# Patient Record
Sex: Female | Born: 1935 | Race: White | Hispanic: No
Health system: Southern US, Community
[De-identification: ages and names within clinical notes are randomized; demographics above are authoritative.]

## PROBLEM LIST (undated history)

## (undated) DIAGNOSIS — G4730 Sleep apnea, unspecified: Secondary | ICD-10-CM

## (undated) DIAGNOSIS — I509 Heart failure, unspecified: Secondary | ICD-10-CM

## (undated) DIAGNOSIS — J45909 Unspecified asthma, uncomplicated: Secondary | ICD-10-CM

## (undated) DIAGNOSIS — Z9181 History of falling: Secondary | ICD-10-CM

## (undated) DIAGNOSIS — E119 Type 2 diabetes mellitus without complications: Secondary | ICD-10-CM

## (undated) DIAGNOSIS — I10 Essential (primary) hypertension: Secondary | ICD-10-CM

## (undated) DIAGNOSIS — Z9889 Other specified postprocedural states: Secondary | ICD-10-CM

## (undated) DIAGNOSIS — M7500 Adhesive capsulitis of unspecified shoulder: Secondary | ICD-10-CM

## (undated) DIAGNOSIS — M4802 Spinal stenosis, cervical region: Secondary | ICD-10-CM

## (undated) DIAGNOSIS — E11618 Type 2 diabetes mellitus with other diabetic arthropathy: Secondary | ICD-10-CM

## (undated) DIAGNOSIS — G629 Polyneuropathy, unspecified: Secondary | ICD-10-CM

## (undated) DIAGNOSIS — G3184 Mild cognitive impairment, so stated: Principal | ICD-10-CM

## (undated) DIAGNOSIS — I272 Pulmonary hypertension, unspecified: Secondary | ICD-10-CM

## (undated) HISTORY — DX: History of falling: Z91.81

## (undated) HISTORY — DX: Adhesive capsulitis of unspecified shoulder: M75.00

## (undated) HISTORY — DX: Mild cognitive impairment, so stated: G31.84

## (undated) HISTORY — DX: Pulmonary hypertension, unspecified: I27.20

## (undated) HISTORY — DX: Type 2 diabetes mellitus with other diabetic arthropathy: E11.618

## (undated) HISTORY — DX: Other specified postprocedural states: Z98.890

---

## 1997-10-13 ENCOUNTER — Encounter: Admission: RE | Admit: 1997-10-13 | Discharge: 1997-10-13 | Payer: Self-pay | Admitting: Family Medicine

## 1997-10-14 ENCOUNTER — Encounter: Admission: RE | Admit: 1997-10-14 | Discharge: 1997-10-14 | Payer: Self-pay | Admitting: Family Medicine

## 1997-10-25 ENCOUNTER — Emergency Department (HOSPITAL_COMMUNITY): Admission: EM | Admit: 1997-10-25 | Discharge: 1997-10-25 | Payer: Self-pay | Admitting: Emergency Medicine

## 1997-10-27 ENCOUNTER — Encounter: Admission: RE | Admit: 1997-10-27 | Discharge: 1997-10-27 | Payer: Self-pay | Admitting: Family Medicine

## 1997-10-29 ENCOUNTER — Encounter: Admission: RE | Admit: 1997-10-29 | Discharge: 1997-10-29 | Payer: Self-pay | Admitting: Family Medicine

## 1997-11-03 ENCOUNTER — Encounter: Admission: RE | Admit: 1997-11-03 | Discharge: 1997-11-03 | Payer: Self-pay | Admitting: Family Medicine

## 1997-12-15 ENCOUNTER — Encounter: Admission: RE | Admit: 1997-12-15 | Discharge: 1997-12-15 | Payer: Self-pay | Admitting: Family Medicine

## 1998-01-12 ENCOUNTER — Encounter: Admission: RE | Admit: 1998-01-12 | Discharge: 1998-01-12 | Payer: Self-pay | Admitting: Family Medicine

## 1998-01-19 ENCOUNTER — Encounter: Admission: RE | Admit: 1998-01-19 | Discharge: 1998-01-19 | Payer: Self-pay | Admitting: Family Medicine

## 1998-02-09 ENCOUNTER — Encounter: Admission: RE | Admit: 1998-02-09 | Discharge: 1998-02-09 | Payer: Self-pay | Admitting: Sports Medicine

## 1998-02-26 ENCOUNTER — Encounter: Admission: RE | Admit: 1998-02-26 | Discharge: 1998-02-26 | Payer: Self-pay | Admitting: Family Medicine

## 1998-03-10 ENCOUNTER — Encounter: Admission: RE | Admit: 1998-03-10 | Discharge: 1998-03-10 | Payer: Self-pay | Admitting: Family Medicine

## 1998-03-16 ENCOUNTER — Encounter: Admission: RE | Admit: 1998-03-16 | Discharge: 1998-03-16 | Payer: Self-pay | Admitting: Family Medicine

## 1998-04-13 ENCOUNTER — Encounter: Admission: RE | Admit: 1998-04-13 | Discharge: 1998-04-13 | Payer: Self-pay | Admitting: Family Medicine

## 1998-04-21 ENCOUNTER — Encounter: Admission: RE | Admit: 1998-04-21 | Discharge: 1998-04-21 | Payer: Self-pay | Admitting: Family Medicine

## 1998-06-01 ENCOUNTER — Encounter: Admission: RE | Admit: 1998-06-01 | Discharge: 1998-06-01 | Payer: Self-pay | Admitting: Family Medicine

## 1998-06-29 ENCOUNTER — Encounter: Admission: RE | Admit: 1998-06-29 | Discharge: 1998-06-29 | Payer: Self-pay | Admitting: Family Medicine

## 1998-07-09 ENCOUNTER — Encounter: Admission: RE | Admit: 1998-07-09 | Discharge: 1998-07-09 | Payer: Self-pay | Admitting: Family Medicine

## 1998-08-17 ENCOUNTER — Encounter: Admission: RE | Admit: 1998-08-17 | Discharge: 1998-08-17 | Payer: Self-pay | Admitting: Family Medicine

## 1998-08-24 ENCOUNTER — Encounter: Admission: RE | Admit: 1998-08-24 | Discharge: 1998-08-24 | Payer: Self-pay | Admitting: Family Medicine

## 1998-09-21 ENCOUNTER — Encounter: Admission: RE | Admit: 1998-09-21 | Discharge: 1998-09-21 | Payer: Self-pay | Admitting: Family Medicine

## 1998-10-13 ENCOUNTER — Encounter: Admission: RE | Admit: 1998-10-13 | Discharge: 1998-10-13 | Payer: Self-pay | Admitting: Family Medicine

## 1998-11-02 ENCOUNTER — Encounter: Admission: RE | Admit: 1998-11-02 | Discharge: 1998-11-02 | Payer: Self-pay | Admitting: Family Medicine

## 1998-12-14 ENCOUNTER — Encounter: Admission: RE | Admit: 1998-12-14 | Discharge: 1998-12-14 | Payer: Self-pay | Admitting: Family Medicine

## 1999-01-18 ENCOUNTER — Encounter: Admission: RE | Admit: 1999-01-18 | Discharge: 1999-01-18 | Payer: Self-pay | Admitting: Family Medicine

## 1999-02-15 ENCOUNTER — Encounter: Admission: RE | Admit: 1999-02-15 | Discharge: 1999-02-15 | Payer: Self-pay | Admitting: Family Medicine

## 1999-02-23 ENCOUNTER — Encounter: Admission: RE | Admit: 1999-02-23 | Discharge: 1999-05-24 | Payer: Self-pay | Admitting: Family Medicine

## 1999-03-08 ENCOUNTER — Encounter: Admission: RE | Admit: 1999-03-08 | Discharge: 1999-03-08 | Payer: Self-pay | Admitting: Family Medicine

## 1999-04-05 ENCOUNTER — Encounter: Admission: RE | Admit: 1999-04-05 | Discharge: 1999-04-05 | Payer: Self-pay | Admitting: Family Medicine

## 1999-05-03 ENCOUNTER — Encounter: Admission: RE | Admit: 1999-05-03 | Discharge: 1999-05-03 | Payer: Self-pay | Admitting: Sports Medicine

## 1999-05-31 ENCOUNTER — Encounter: Admission: RE | Admit: 1999-05-31 | Discharge: 1999-05-31 | Payer: Self-pay | Admitting: Family Medicine

## 1999-06-27 ENCOUNTER — Encounter (INDEPENDENT_AMBULATORY_CARE_PROVIDER_SITE_OTHER): Payer: Self-pay | Admitting: *Deleted

## 1999-06-27 LAB — CONVERTED CEMR LAB

## 1999-07-26 ENCOUNTER — Encounter: Admission: RE | Admit: 1999-07-26 | Discharge: 1999-07-26 | Payer: Self-pay | Admitting: Family Medicine

## 1999-07-26 ENCOUNTER — Other Ambulatory Visit: Admission: RE | Admit: 1999-07-26 | Discharge: 1999-07-26 | Payer: Self-pay | Admitting: Family Medicine

## 1999-10-04 ENCOUNTER — Encounter: Admission: RE | Admit: 1999-10-04 | Discharge: 1999-10-04 | Payer: Self-pay | Admitting: Family Medicine

## 1999-10-04 ENCOUNTER — Ambulatory Visit (HOSPITAL_COMMUNITY): Admission: RE | Admit: 1999-10-04 | Discharge: 1999-10-04 | Payer: Self-pay | Admitting: Family Medicine

## 1999-10-27 ENCOUNTER — Encounter: Admission: RE | Admit: 1999-10-27 | Discharge: 1999-10-27 | Payer: Self-pay | Admitting: Sports Medicine

## 1999-10-27 ENCOUNTER — Encounter: Payer: Self-pay | Admitting: Sports Medicine

## 1999-11-08 ENCOUNTER — Encounter: Admission: RE | Admit: 1999-11-08 | Discharge: 1999-11-08 | Payer: Self-pay | Admitting: Family Medicine

## 2000-03-27 ENCOUNTER — Encounter: Admission: RE | Admit: 2000-03-27 | Discharge: 2000-03-27 | Payer: Self-pay | Admitting: Family Medicine

## 2000-05-02 ENCOUNTER — Encounter: Admission: RE | Admit: 2000-05-02 | Discharge: 2000-05-02 | Payer: Self-pay | Admitting: Family Medicine

## 2000-07-10 ENCOUNTER — Encounter: Admission: RE | Admit: 2000-07-10 | Discharge: 2000-07-10 | Payer: Self-pay | Admitting: Family Medicine

## 2000-10-30 ENCOUNTER — Encounter: Admission: RE | Admit: 2000-10-30 | Discharge: 2000-10-30 | Payer: Self-pay | Admitting: Family Medicine

## 2001-04-23 ENCOUNTER — Encounter: Admission: RE | Admit: 2001-04-23 | Discharge: 2001-04-23 | Payer: Self-pay | Admitting: Family Medicine

## 2001-04-30 ENCOUNTER — Encounter: Admission: RE | Admit: 2001-04-30 | Discharge: 2001-04-30 | Payer: Self-pay | Admitting: Family Medicine

## 2001-07-02 ENCOUNTER — Encounter: Admission: RE | Admit: 2001-07-02 | Discharge: 2001-07-02 | Payer: Self-pay | Admitting: Family Medicine

## 2001-09-10 ENCOUNTER — Encounter: Admission: RE | Admit: 2001-09-10 | Discharge: 2001-09-10 | Payer: Self-pay | Admitting: Family Medicine

## 2001-09-23 ENCOUNTER — Encounter: Admission: RE | Admit: 2001-09-23 | Discharge: 2001-09-23 | Payer: Self-pay | Admitting: Family Medicine

## 2001-10-15 ENCOUNTER — Encounter: Admission: RE | Admit: 2001-10-15 | Discharge: 2001-10-15 | Payer: Self-pay | Admitting: Family Medicine

## 2002-02-11 ENCOUNTER — Encounter: Admission: RE | Admit: 2002-02-11 | Discharge: 2002-02-11 | Payer: Self-pay | Admitting: Family Medicine

## 2002-03-14 ENCOUNTER — Encounter: Admission: RE | Admit: 2002-03-14 | Discharge: 2002-03-14 | Payer: Self-pay | Admitting: Family Medicine

## 2002-03-14 ENCOUNTER — Encounter: Payer: Self-pay | Admitting: Family Medicine

## 2002-05-02 ENCOUNTER — Encounter: Admission: RE | Admit: 2002-05-02 | Discharge: 2002-05-02 | Payer: Self-pay | Admitting: Family Medicine

## 2002-05-27 ENCOUNTER — Encounter: Admission: RE | Admit: 2002-05-27 | Discharge: 2002-05-27 | Payer: Self-pay | Admitting: Family Medicine

## 2002-05-27 ENCOUNTER — Encounter: Payer: Self-pay | Admitting: Family Medicine

## 2002-07-08 ENCOUNTER — Encounter: Admission: RE | Admit: 2002-07-08 | Discharge: 2002-07-08 | Payer: Self-pay | Admitting: Family Medicine

## 2002-11-18 ENCOUNTER — Encounter: Admission: RE | Admit: 2002-11-18 | Discharge: 2002-11-18 | Payer: Self-pay | Admitting: Family Medicine

## 2003-03-17 ENCOUNTER — Encounter: Admission: RE | Admit: 2003-03-17 | Discharge: 2003-03-17 | Payer: Self-pay | Admitting: Family Medicine

## 2003-04-14 ENCOUNTER — Encounter: Admission: RE | Admit: 2003-04-14 | Discharge: 2003-04-14 | Payer: Self-pay | Admitting: Family Medicine

## 2003-06-02 ENCOUNTER — Encounter: Admission: RE | Admit: 2003-06-02 | Discharge: 2003-06-02 | Payer: Self-pay | Admitting: Family Medicine

## 2003-07-22 ENCOUNTER — Encounter: Admission: RE | Admit: 2003-07-22 | Discharge: 2003-07-22 | Payer: Self-pay | Admitting: Family Medicine

## 2003-08-25 ENCOUNTER — Encounter: Admission: RE | Admit: 2003-08-25 | Discharge: 2003-08-25 | Payer: Self-pay | Admitting: Family Medicine

## 2003-12-29 ENCOUNTER — Encounter: Admission: RE | Admit: 2003-12-29 | Discharge: 2003-12-29 | Payer: Self-pay | Admitting: Sports Medicine

## 2004-01-26 ENCOUNTER — Encounter: Admission: RE | Admit: 2004-01-26 | Discharge: 2004-01-26 | Payer: Self-pay | Admitting: Family Medicine

## 2004-04-08 ENCOUNTER — Ambulatory Visit: Payer: Self-pay | Admitting: Family Medicine

## 2004-06-14 ENCOUNTER — Ambulatory Visit: Payer: Self-pay | Admitting: Family Medicine

## 2004-06-16 ENCOUNTER — Ambulatory Visit: Payer: Self-pay | Admitting: Sports Medicine

## 2004-06-30 ENCOUNTER — Ambulatory Visit (HOSPITAL_COMMUNITY): Admission: RE | Admit: 2004-06-30 | Discharge: 2004-06-30 | Payer: Self-pay | Admitting: Family Medicine

## 2004-08-02 ENCOUNTER — Ambulatory Visit: Payer: Self-pay | Admitting: Family Medicine

## 2004-12-20 ENCOUNTER — Ambulatory Visit: Payer: Self-pay | Admitting: Family Medicine

## 2005-03-21 ENCOUNTER — Encounter: Admission: RE | Admit: 2005-03-21 | Discharge: 2005-03-21 | Payer: Self-pay | Admitting: Family Medicine

## 2005-03-23 ENCOUNTER — Ambulatory Visit: Payer: Self-pay | Admitting: Family Medicine

## 2005-04-24 ENCOUNTER — Ambulatory Visit: Payer: Self-pay | Admitting: Sports Medicine

## 2005-07-03 ENCOUNTER — Emergency Department (HOSPITAL_COMMUNITY): Admission: EM | Admit: 2005-07-03 | Discharge: 2005-07-03 | Payer: Self-pay | Admitting: Family Medicine

## 2005-08-14 ENCOUNTER — Ambulatory Visit: Payer: Self-pay | Admitting: Family Medicine

## 2005-08-21 ENCOUNTER — Ambulatory Visit: Payer: Self-pay | Admitting: Family Medicine

## 2005-08-24 DIAGNOSIS — Z9889 Other specified postprocedural states: Secondary | ICD-10-CM

## 2005-08-24 HISTORY — DX: Other specified postprocedural states: Z98.890

## 2005-11-06 ENCOUNTER — Ambulatory Visit: Payer: Self-pay | Admitting: Family Medicine

## 2006-01-09 ENCOUNTER — Ambulatory Visit: Payer: Self-pay | Admitting: Family Medicine

## 2006-01-09 ENCOUNTER — Encounter: Admission: RE | Admit: 2006-01-09 | Discharge: 2006-01-09 | Payer: Self-pay | Admitting: Family Medicine

## 2006-01-30 ENCOUNTER — Ambulatory Visit: Payer: Self-pay | Admitting: Family Medicine

## 2006-03-19 ENCOUNTER — Ambulatory Visit: Payer: Self-pay | Admitting: Family Medicine

## 2006-07-09 ENCOUNTER — Ambulatory Visit: Payer: Self-pay | Admitting: Sports Medicine

## 2006-08-23 DIAGNOSIS — F329 Major depressive disorder, single episode, unspecified: Secondary | ICD-10-CM

## 2006-08-23 DIAGNOSIS — E785 Hyperlipidemia, unspecified: Secondary | ICD-10-CM

## 2006-08-23 DIAGNOSIS — K219 Gastro-esophageal reflux disease without esophagitis: Secondary | ICD-10-CM | POA: Insufficient documentation

## 2006-08-23 DIAGNOSIS — G4730 Sleep apnea, unspecified: Secondary | ICD-10-CM | POA: Insufficient documentation

## 2006-08-23 DIAGNOSIS — I10 Essential (primary) hypertension: Secondary | ICD-10-CM

## 2006-08-23 DIAGNOSIS — G4700 Insomnia, unspecified: Secondary | ICD-10-CM

## 2006-08-23 DIAGNOSIS — I872 Venous insufficiency (chronic) (peripheral): Secondary | ICD-10-CM | POA: Insufficient documentation

## 2006-08-23 DIAGNOSIS — L719 Rosacea, unspecified: Secondary | ICD-10-CM | POA: Insufficient documentation

## 2006-08-23 DIAGNOSIS — M479 Spondylosis, unspecified: Secondary | ICD-10-CM | POA: Insufficient documentation

## 2006-08-23 DIAGNOSIS — E669 Obesity, unspecified: Secondary | ICD-10-CM

## 2006-08-23 DIAGNOSIS — M1710 Unilateral primary osteoarthritis, unspecified knee: Secondary | ICD-10-CM

## 2006-08-24 ENCOUNTER — Encounter (INDEPENDENT_AMBULATORY_CARE_PROVIDER_SITE_OTHER): Payer: Self-pay | Admitting: *Deleted

## 2006-08-30 ENCOUNTER — Telehealth (INDEPENDENT_AMBULATORY_CARE_PROVIDER_SITE_OTHER): Payer: Self-pay | Admitting: *Deleted

## 2006-09-06 ENCOUNTER — Encounter: Payer: Self-pay | Admitting: Family Medicine

## 2006-09-14 ENCOUNTER — Telehealth: Payer: Self-pay | Admitting: Family Medicine

## 2006-10-11 ENCOUNTER — Encounter: Payer: Self-pay | Admitting: Family Medicine

## 2006-10-11 ENCOUNTER — Ambulatory Visit: Payer: Self-pay | Admitting: Family Medicine

## 2006-10-11 LAB — CONVERTED CEMR LAB: Hgb A1c MFr Bld: 6.2 %

## 2006-10-15 ENCOUNTER — Encounter: Payer: Self-pay | Admitting: Family Medicine

## 2006-10-15 LAB — CONVERTED CEMR LAB
BUN: 25 mg/dL — ABNORMAL HIGH (ref 6–23)
CO2: 25 meq/L (ref 19–32)
Calcium: 9 mg/dL (ref 8.4–10.5)
Chloride: 102 meq/L (ref 96–112)
Creatinine, Ser: 1.17 mg/dL (ref 0.40–1.20)
Glucose, Bld: 97 mg/dL (ref 70–99)
Potassium: 5.6 meq/L — ABNORMAL HIGH (ref 3.5–5.3)
Sodium: 142 meq/L (ref 135–145)

## 2006-10-19 ENCOUNTER — Ambulatory Visit: Payer: Self-pay | Admitting: Family Medicine

## 2006-10-19 ENCOUNTER — Encounter: Payer: Self-pay | Admitting: Family Medicine

## 2006-10-26 LAB — CONVERTED CEMR LAB
BUN: 31 mg/dL — ABNORMAL HIGH (ref 6–23)
Chloride: 102 meq/L (ref 96–112)
Creatinine, Ser: 1.42 mg/dL — ABNORMAL HIGH (ref 0.40–1.20)
Glucose, Bld: 83 mg/dL (ref 70–99)
Potassium: 5.6 meq/L — ABNORMAL HIGH (ref 3.5–5.3)

## 2006-11-01 ENCOUNTER — Telehealth (INDEPENDENT_AMBULATORY_CARE_PROVIDER_SITE_OTHER): Payer: Self-pay | Admitting: Family Medicine

## 2006-11-13 ENCOUNTER — Telehealth: Payer: Self-pay | Admitting: *Deleted

## 2006-12-18 ENCOUNTER — Telehealth: Payer: Self-pay | Admitting: *Deleted

## 2006-12-29 ENCOUNTER — Emergency Department (HOSPITAL_COMMUNITY): Admission: EM | Admit: 2006-12-29 | Discharge: 2006-12-29 | Payer: Self-pay | Admitting: Family Medicine

## 2006-12-31 ENCOUNTER — Ambulatory Visit: Payer: Self-pay | Admitting: Sports Medicine

## 2006-12-31 ENCOUNTER — Telehealth: Payer: Self-pay | Admitting: *Deleted

## 2006-12-31 LAB — CONVERTED CEMR LAB
Nitrite: NEGATIVE
Protein, U semiquant: 30
Urobilinogen, UA: 0.2

## 2007-01-01 ENCOUNTER — Ambulatory Visit: Payer: Self-pay | Admitting: Vascular Surgery

## 2007-01-01 ENCOUNTER — Encounter (INDEPENDENT_AMBULATORY_CARE_PROVIDER_SITE_OTHER): Payer: Self-pay | Admitting: Internal Medicine

## 2007-01-01 ENCOUNTER — Encounter: Payer: Self-pay | Admitting: Family Medicine

## 2007-01-01 ENCOUNTER — Ambulatory Visit: Payer: Self-pay | Admitting: Family Medicine

## 2007-01-01 ENCOUNTER — Inpatient Hospital Stay (HOSPITAL_COMMUNITY): Admission: EM | Admit: 2007-01-01 | Discharge: 2007-01-22 | Payer: Self-pay | Admitting: Emergency Medicine

## 2007-01-02 ENCOUNTER — Encounter: Payer: Self-pay | Admitting: Family Medicine

## 2007-01-11 ENCOUNTER — Encounter (INDEPENDENT_AMBULATORY_CARE_PROVIDER_SITE_OTHER): Payer: Self-pay | Admitting: Gastroenterology

## 2007-01-17 ENCOUNTER — Encounter: Payer: Self-pay | Admitting: Family Medicine

## 2007-01-21 ENCOUNTER — Encounter: Payer: Self-pay | Admitting: Family Medicine

## 2007-01-23 ENCOUNTER — Encounter: Admission: RE | Admit: 2007-01-23 | Discharge: 2007-01-23 | Payer: Self-pay | Admitting: Family Medicine

## 2007-01-27 ENCOUNTER — Telehealth (INDEPENDENT_AMBULATORY_CARE_PROVIDER_SITE_OTHER): Payer: Self-pay | Admitting: Family Medicine

## 2007-01-28 ENCOUNTER — Telehealth: Payer: Self-pay | Admitting: Family Medicine

## 2007-02-19 ENCOUNTER — Encounter: Payer: Self-pay | Admitting: Family Medicine

## 2007-03-11 ENCOUNTER — Ambulatory Visit: Payer: Self-pay | Admitting: Family Medicine

## 2007-03-11 ENCOUNTER — Encounter: Payer: Self-pay | Admitting: Family Medicine

## 2007-03-11 DIAGNOSIS — D510 Vitamin B12 deficiency anemia due to intrinsic factor deficiency: Secondary | ICD-10-CM | POA: Insufficient documentation

## 2007-03-11 LAB — CONVERTED CEMR LAB
BUN: 20 mg/dL (ref 6–23)
CO2: 25 meq/L (ref 19–32)
Chloride: 99 meq/L (ref 96–112)
Glucose, Bld: 113 mg/dL — ABNORMAL HIGH (ref 70–99)
Hemoglobin: 12.3 g/dL (ref 12.0–15.0)
Hgb A1c MFr Bld: 6.5 %
Potassium: 4.9 meq/L (ref 3.5–5.3)
RBC: 4.4 M/uL (ref 3.87–5.11)
RDW: 15.6 % — ABNORMAL HIGH (ref 11.5–14.0)
WBC: 10 10*3/uL (ref 4.0–10.5)

## 2007-03-13 ENCOUNTER — Telehealth: Payer: Self-pay | Admitting: Family Medicine

## 2007-03-18 ENCOUNTER — Telehealth: Payer: Self-pay | Admitting: Family Medicine

## 2007-03-22 ENCOUNTER — Telehealth: Payer: Self-pay | Admitting: *Deleted

## 2007-03-25 ENCOUNTER — Emergency Department (HOSPITAL_COMMUNITY): Admission: EM | Admit: 2007-03-25 | Discharge: 2007-03-26 | Payer: Self-pay | Admitting: Emergency Medicine

## 2007-03-25 ENCOUNTER — Ambulatory Visit: Payer: Self-pay | Admitting: Sports Medicine

## 2007-03-25 ENCOUNTER — Telehealth: Payer: Self-pay | Admitting: *Deleted

## 2007-03-26 ENCOUNTER — Telehealth: Payer: Self-pay | Admitting: *Deleted

## 2007-03-29 ENCOUNTER — Telehealth: Payer: Self-pay | Admitting: *Deleted

## 2007-03-29 ENCOUNTER — Ambulatory Visit: Payer: Self-pay | Admitting: Family Medicine

## 2007-04-01 ENCOUNTER — Encounter: Payer: Self-pay | Admitting: Family Medicine

## 2007-04-10 ENCOUNTER — Telehealth: Payer: Self-pay | Admitting: Family Medicine

## 2007-04-11 ENCOUNTER — Ambulatory Visit: Payer: Self-pay | Admitting: Family Medicine

## 2007-04-12 ENCOUNTER — Encounter: Payer: Self-pay | Admitting: Family Medicine

## 2007-04-12 LAB — CONVERTED CEMR LAB
ALT: 39 units/L — ABNORMAL HIGH (ref 0–35)
CO2: 24 meq/L (ref 19–32)
Creatinine, Ser: 1.46 mg/dL — ABNORMAL HIGH (ref 0.40–1.20)
HCT: 41.9 % (ref 36.0–46.0)
MCHC: 30.8 g/dL (ref 30.0–36.0)
MCV: 92.3 fL (ref 78.0–100.0)
Platelets: 265 10*3/uL (ref 150–400)
Total Bilirubin: 0.6 mg/dL (ref 0.3–1.2)
Uric Acid, Serum: 6.6 mg/dL (ref 2.4–7.0)
WBC: 7.3 10*3/uL (ref 4.0–10.5)

## 2007-05-01 ENCOUNTER — Telehealth: Payer: Self-pay | Admitting: *Deleted

## 2007-05-02 ENCOUNTER — Ambulatory Visit: Payer: Self-pay | Admitting: Family Medicine

## 2007-05-30 ENCOUNTER — Ambulatory Visit: Payer: Self-pay | Admitting: Family Medicine

## 2007-05-31 ENCOUNTER — Encounter: Payer: Self-pay | Admitting: Family Medicine

## 2007-05-31 ENCOUNTER — Telehealth: Payer: Self-pay | Admitting: Family Medicine

## 2007-06-06 ENCOUNTER — Telehealth: Payer: Self-pay | Admitting: *Deleted

## 2007-06-06 ENCOUNTER — Telehealth (INDEPENDENT_AMBULATORY_CARE_PROVIDER_SITE_OTHER): Payer: Self-pay | Admitting: Family Medicine

## 2007-06-06 ENCOUNTER — Ambulatory Visit: Payer: Self-pay | Admitting: Family Medicine

## 2007-06-18 ENCOUNTER — Telehealth: Payer: Self-pay | Admitting: Family Medicine

## 2007-07-11 ENCOUNTER — Ambulatory Visit: Payer: Self-pay | Admitting: Family Medicine

## 2007-07-11 DIAGNOSIS — N183 Chronic kidney disease, stage 3 unspecified: Secondary | ICD-10-CM | POA: Insufficient documentation

## 2007-07-11 LAB — CONVERTED CEMR LAB: Hgb A1c MFr Bld: 6 %

## 2007-07-12 LAB — CONVERTED CEMR LAB
Albumin: 4.5 g/dL (ref 3.5–5.2)
BUN: 33 mg/dL — ABNORMAL HIGH (ref 6–23)
Calcium: 9.7 mg/dL (ref 8.4–10.5)
Chloride: 99 meq/L (ref 96–112)
Glucose, Bld: 95 mg/dL (ref 70–99)
Hemoglobin: 13.7 g/dL (ref 12.0–15.0)
MCHC: 31.9 g/dL (ref 30.0–36.0)
Potassium: 4.3 meq/L (ref 3.5–5.3)
RBC: 4.35 M/uL (ref 3.87–5.11)

## 2007-07-17 ENCOUNTER — Encounter: Payer: Self-pay | Admitting: Family Medicine

## 2007-08-22 ENCOUNTER — Ambulatory Visit: Payer: Self-pay | Admitting: Family Medicine

## 2007-08-22 ENCOUNTER — Telehealth: Payer: Self-pay | Admitting: *Deleted

## 2007-08-22 LAB — CONVERTED CEMR LAB
Cholesterol: 133 mg/dL (ref 0–200)
Potassium: 4.2 meq/L (ref 3.5–5.3)
Sodium: 140 meq/L (ref 135–145)
Total CHOL/HDL Ratio: 2.6
Triglycerides: 160 mg/dL — ABNORMAL HIGH (ref <150)
VLDL: 32 mg/dL (ref 0–40)

## 2007-08-26 ENCOUNTER — Encounter: Payer: Self-pay | Admitting: Family Medicine

## 2007-09-05 ENCOUNTER — Telehealth: Payer: Self-pay | Admitting: Family Medicine

## 2007-09-19 ENCOUNTER — Encounter: Payer: Self-pay | Admitting: Family Medicine

## 2007-10-10 ENCOUNTER — Ambulatory Visit: Payer: Self-pay | Admitting: Family Medicine

## 2007-10-24 ENCOUNTER — Encounter: Payer: Self-pay | Admitting: Family Medicine

## 2007-11-05 ENCOUNTER — Encounter: Admission: RE | Admit: 2007-11-05 | Discharge: 2007-11-05 | Payer: Self-pay | Admitting: Family Medicine

## 2007-11-12 ENCOUNTER — Ambulatory Visit: Payer: Self-pay | Admitting: Family Medicine

## 2007-11-14 ENCOUNTER — Ambulatory Visit: Payer: Self-pay | Admitting: Family Medicine

## 2007-11-14 DIAGNOSIS — R32 Unspecified urinary incontinence: Secondary | ICD-10-CM

## 2007-11-14 LAB — CONVERTED CEMR LAB
Bilirubin Urine: NEGATIVE
CO2: 27 meq/L (ref 19–32)
Calcium: 9.7 mg/dL (ref 8.4–10.5)
Glucose, Urine, Semiquant: NEGATIVE
Nitrite: NEGATIVE
Protein, U semiquant: 100
Sodium: 140 meq/L (ref 135–145)
Specific Gravity, Urine: 1.015

## 2007-11-25 ENCOUNTER — Encounter: Payer: Self-pay | Admitting: Family Medicine

## 2007-11-29 ENCOUNTER — Telehealth: Payer: Self-pay | Admitting: Family Medicine

## 2007-12-19 ENCOUNTER — Ambulatory Visit: Payer: Self-pay | Admitting: Family Medicine

## 2007-12-23 ENCOUNTER — Encounter: Payer: Self-pay | Admitting: Family Medicine

## 2007-12-27 ENCOUNTER — Emergency Department (HOSPITAL_COMMUNITY): Admission: EM | Admit: 2007-12-27 | Discharge: 2007-12-27 | Payer: Self-pay | Admitting: Emergency Medicine

## 2008-01-10 ENCOUNTER — Ambulatory Visit: Payer: Self-pay | Admitting: Family Medicine

## 2008-02-05 ENCOUNTER — Encounter: Payer: Self-pay | Admitting: Family Medicine

## 2008-02-05 DIAGNOSIS — E1149 Type 2 diabetes mellitus with other diabetic neurological complication: Secondary | ICD-10-CM | POA: Insufficient documentation

## 2008-02-12 ENCOUNTER — Ambulatory Visit: Payer: Self-pay | Admitting: Family Medicine

## 2008-02-20 ENCOUNTER — Encounter: Payer: Self-pay | Admitting: Family Medicine

## 2008-02-27 ENCOUNTER — Encounter: Payer: Self-pay | Admitting: Family Medicine

## 2008-02-27 ENCOUNTER — Ambulatory Visit: Payer: Self-pay | Admitting: Family Medicine

## 2008-02-27 LAB — CONVERTED CEMR LAB
CO2: 27 meq/L (ref 19–32)
Calcium: 9.3 mg/dL (ref 8.4–10.5)
Chloride: 99 meq/L (ref 96–112)
Glucose, Bld: 90 mg/dL (ref 70–99)
Hgb A1c MFr Bld: 6.4 %
MCV: 95.9 fL (ref 78.0–100.0)
Potassium: 4.9 meq/L (ref 3.5–5.3)
RBC: 3.86 M/uL — ABNORMAL LOW (ref 3.87–5.11)
Sodium: 136 meq/L (ref 135–145)
WBC: 6.1 10*3/uL (ref 4.0–10.5)

## 2008-03-09 ENCOUNTER — Encounter: Admission: RE | Admit: 2008-03-09 | Discharge: 2008-03-09 | Payer: Self-pay | Admitting: Family Medicine

## 2008-03-10 ENCOUNTER — Encounter: Payer: Self-pay | Admitting: Family Medicine

## 2008-03-11 ENCOUNTER — Encounter: Payer: Self-pay | Admitting: Family Medicine

## 2008-03-13 ENCOUNTER — Encounter: Payer: Self-pay | Admitting: Family Medicine

## 2008-03-17 ENCOUNTER — Encounter: Payer: Self-pay | Admitting: Family Medicine

## 2008-03-23 ENCOUNTER — Ambulatory Visit: Payer: Self-pay | Admitting: Family Medicine

## 2008-04-17 ENCOUNTER — Emergency Department (HOSPITAL_COMMUNITY): Admission: EM | Admit: 2008-04-17 | Discharge: 2008-04-17 | Payer: Self-pay | Admitting: Emergency Medicine

## 2008-05-01 ENCOUNTER — Encounter: Payer: Self-pay | Admitting: Family Medicine

## 2008-05-01 ENCOUNTER — Emergency Department (HOSPITAL_COMMUNITY): Admission: EM | Admit: 2008-05-01 | Discharge: 2008-05-01 | Payer: Self-pay | Admitting: Family Medicine

## 2008-07-09 ENCOUNTER — Encounter: Payer: Self-pay | Admitting: Family Medicine

## 2008-07-16 ENCOUNTER — Ambulatory Visit (HOSPITAL_COMMUNITY): Admission: RE | Admit: 2008-07-16 | Discharge: 2008-07-16 | Payer: Self-pay | Admitting: Family Medicine

## 2008-07-16 ENCOUNTER — Ambulatory Visit: Payer: Self-pay | Admitting: Family Medicine

## 2008-07-16 DIAGNOSIS — G2581 Restless legs syndrome: Secondary | ICD-10-CM

## 2008-07-16 LAB — CONVERTED CEMR LAB
BUN: 22 mg/dL
CO2: 25 meq/L
Calcium: 9 mg/dL
Chloride: 98 meq/L
Creatinine, Ser: 1.18 mg/dL
Glucose, Bld: 90 mg/dL
HCT: 36.1 %
Hemoglobin: 11.7 g/dL — ABNORMAL LOW
Hgb A1c MFr Bld: 6.2 %
MCHC: 32.4 g/dL
MCV: 96.8 fL
Platelets: 260 K/uL
Potassium: 4.9 meq/L
RBC: 3.73 M/uL — ABNORMAL LOW
RDW: 16 % — ABNORMAL HIGH
Sodium: 139 meq/L
Uric Acid, Serum: 3 mg/dL
Vit D, 1,25-Dihydroxy: 14 — ABNORMAL LOW
WBC: 6.8 10*3/microliter

## 2008-07-17 ENCOUNTER — Encounter: Payer: Self-pay | Admitting: Family Medicine

## 2008-07-18 ENCOUNTER — Emergency Department (HOSPITAL_COMMUNITY): Admission: EM | Admit: 2008-07-18 | Discharge: 2008-07-18 | Payer: Self-pay | Admitting: Emergency Medicine

## 2008-07-22 ENCOUNTER — Encounter: Payer: Self-pay | Admitting: Family Medicine

## 2008-07-28 ENCOUNTER — Telehealth: Payer: Self-pay | Admitting: Family Medicine

## 2008-08-11 ENCOUNTER — Encounter (INDEPENDENT_AMBULATORY_CARE_PROVIDER_SITE_OTHER): Payer: Self-pay | Admitting: *Deleted

## 2008-08-12 ENCOUNTER — Encounter: Payer: Self-pay | Admitting: Cardiovascular Disease

## 2008-08-12 ENCOUNTER — Ambulatory Visit: Payer: Self-pay | Admitting: Cardiovascular Disease

## 2008-08-13 ENCOUNTER — Encounter: Payer: Self-pay | Admitting: Family Medicine

## 2008-08-14 ENCOUNTER — Telehealth: Payer: Self-pay | Admitting: Family Medicine

## 2008-08-20 ENCOUNTER — Ambulatory Visit: Payer: Self-pay | Admitting: Family Medicine

## 2008-08-21 ENCOUNTER — Encounter: Payer: Self-pay | Admitting: Family Medicine

## 2008-08-21 ENCOUNTER — Ambulatory Visit: Payer: Self-pay

## 2008-08-31 ENCOUNTER — Encounter: Payer: Self-pay | Admitting: Family Medicine

## 2008-09-02 ENCOUNTER — Telehealth: Payer: Self-pay | Admitting: Family Medicine

## 2008-09-08 ENCOUNTER — Encounter: Payer: Self-pay | Admitting: Family Medicine

## 2008-10-27 ENCOUNTER — Telehealth: Payer: Self-pay | Admitting: Family Medicine

## 2008-10-27 ENCOUNTER — Encounter: Payer: Self-pay | Admitting: Family Medicine

## 2008-12-07 ENCOUNTER — Telehealth: Payer: Self-pay | Admitting: Family Medicine

## 2008-12-17 ENCOUNTER — Ambulatory Visit: Payer: Self-pay | Admitting: Family Medicine

## 2008-12-17 LAB — CONVERTED CEMR LAB
ALT: 12 units/L (ref 0–35)
Alkaline Phosphatase: 91 units/L (ref 39–117)
CO2: 25 meq/L (ref 19–32)
Creatinine, Ser: 1.18 mg/dL (ref 0.40–1.20)
Glucose, Bld: 144 mg/dL — ABNORMAL HIGH (ref 70–99)
HCT: 35.3 % — ABNORMAL LOW (ref 36.0–46.0)
MCHC: 32.3 g/dL (ref 30.0–36.0)
MCV: 97.5 fL (ref 78.0–100.0)
Platelets: 263 10*3/uL (ref 150–400)
Sodium: 136 meq/L (ref 135–145)
Total Bilirubin: 0.4 mg/dL (ref 0.3–1.2)
Total Protein: 6.4 g/dL (ref 6.0–8.3)

## 2009-01-12 ENCOUNTER — Encounter: Payer: Self-pay | Admitting: Family Medicine

## 2009-01-13 ENCOUNTER — Ambulatory Visit: Payer: Self-pay | Admitting: Family Medicine

## 2009-02-11 ENCOUNTER — Encounter: Admission: RE | Admit: 2009-02-11 | Discharge: 2009-02-11 | Payer: Self-pay | Admitting: Family Medicine

## 2009-02-11 ENCOUNTER — Encounter: Payer: Self-pay | Admitting: Family Medicine

## 2009-02-18 ENCOUNTER — Ambulatory Visit: Payer: Self-pay | Admitting: Cardiovascular Disease

## 2009-02-23 ENCOUNTER — Ambulatory Visit: Payer: Self-pay | Admitting: Family Medicine

## 2009-02-23 LAB — CONVERTED CEMR LAB
CO2: 23 meq/L (ref 19–32)
Calcium: 9.6 mg/dL (ref 8.4–10.5)
Creatinine, Ser: 1.32 mg/dL — ABNORMAL HIGH (ref 0.40–1.20)
Direct LDL: 94 mg/dL
Glucose, Bld: 87 mg/dL (ref 70–99)
HCT: 35.9 % — ABNORMAL LOW (ref 36.0–46.0)
MCHC: 31.5 g/dL (ref 30.0–36.0)
MCV: 99.2 fL (ref 78.0–100.0)
RBC: 3.62 M/uL — ABNORMAL LOW (ref 3.87–5.11)
Sodium: 138 meq/L (ref 135–145)
WBC: 11.8 10*3/uL — ABNORMAL HIGH (ref 4.0–10.5)

## 2009-03-02 ENCOUNTER — Encounter: Payer: Self-pay | Admitting: Family Medicine

## 2009-03-08 ENCOUNTER — Ambulatory Visit: Payer: Self-pay | Admitting: Family Medicine

## 2009-03-12 ENCOUNTER — Telehealth: Payer: Self-pay | Admitting: Family Medicine

## 2009-03-17 ENCOUNTER — Telehealth (INDEPENDENT_AMBULATORY_CARE_PROVIDER_SITE_OTHER): Payer: Self-pay | Admitting: *Deleted

## 2009-03-23 ENCOUNTER — Inpatient Hospital Stay (HOSPITAL_COMMUNITY): Admission: RE | Admit: 2009-03-23 | Discharge: 2009-03-26 | Payer: Self-pay | Admitting: Orthopedic Surgery

## 2009-04-06 ENCOUNTER — Encounter: Payer: Self-pay | Admitting: Family Medicine

## 2009-04-14 ENCOUNTER — Telehealth: Payer: Self-pay | Admitting: Family Medicine

## 2009-04-14 ENCOUNTER — Emergency Department (HOSPITAL_COMMUNITY): Admission: EM | Admit: 2009-04-14 | Discharge: 2009-04-14 | Payer: Self-pay | Admitting: Emergency Medicine

## 2009-04-15 ENCOUNTER — Telehealth: Payer: Self-pay | Admitting: Family Medicine

## 2009-04-22 ENCOUNTER — Telehealth: Payer: Self-pay | Admitting: Family Medicine

## 2009-04-23 ENCOUNTER — Telehealth: Payer: Self-pay | Admitting: Family Medicine

## 2009-04-30 ENCOUNTER — Telehealth: Payer: Self-pay | Admitting: Family Medicine

## 2009-05-11 ENCOUNTER — Ambulatory Visit: Payer: Self-pay | Admitting: Family Medicine

## 2009-05-13 ENCOUNTER — Encounter: Payer: Self-pay | Admitting: Family Medicine

## 2009-05-13 LAB — CONVERTED CEMR LAB
ALT: 12 units/L (ref 0–35)
AST: 18 units/L (ref 0–37)
Alkaline Phosphatase: 101 units/L (ref 39–117)
BUN: 29 mg/dL — ABNORMAL HIGH (ref 6–23)
Basophils Absolute: 0.1 10*3/uL (ref 0.0–0.1)
Basophils Relative: 1 % (ref 0–1)
Calcium: 9.7 mg/dL (ref 8.4–10.5)
Chloride: 100 meq/L (ref 96–112)
Creatinine, Ser: 1.32 mg/dL — ABNORMAL HIGH (ref 0.40–1.20)
Eosinophils Absolute: 2.3 10*3/uL — ABNORMAL HIGH (ref 0.0–0.7)
MCHC: 31 g/dL (ref 30.0–36.0)
MCV: 99.5 fL (ref 78.0–100.0)
Neutro Abs: 6.3 10*3/uL (ref 1.7–7.7)
Neutrophils Relative %: 57 % (ref 43–77)
Platelets: 312 10*3/uL (ref 150–400)
RDW: 15.8 % — ABNORMAL HIGH (ref 11.5–15.5)
Sed Rate: 32 mm/hr — ABNORMAL HIGH (ref 0–22)
Total Bilirubin: 0.3 mg/dL (ref 0.3–1.2)

## 2009-05-14 ENCOUNTER — Telehealth: Payer: Self-pay | Admitting: *Deleted

## 2009-06-08 ENCOUNTER — Telehealth: Payer: Self-pay | Admitting: Family Medicine

## 2009-06-16 ENCOUNTER — Encounter: Payer: Self-pay | Admitting: Family Medicine

## 2009-07-01 ENCOUNTER — Encounter (INDEPENDENT_AMBULATORY_CARE_PROVIDER_SITE_OTHER): Payer: Self-pay | Admitting: *Deleted

## 2009-07-12 ENCOUNTER — Telehealth: Payer: Self-pay | Admitting: Family Medicine

## 2009-07-19 ENCOUNTER — Ambulatory Visit: Payer: Self-pay | Admitting: Family Medicine

## 2009-08-17 ENCOUNTER — Ambulatory Visit: Payer: Self-pay | Admitting: Family Medicine

## 2009-08-17 ENCOUNTER — Encounter: Admission: RE | Admit: 2009-08-17 | Discharge: 2009-08-17 | Payer: Self-pay | Admitting: Sports Medicine

## 2009-08-24 ENCOUNTER — Telehealth: Payer: Self-pay | Admitting: Family Medicine

## 2009-08-30 ENCOUNTER — Telehealth: Payer: Self-pay | Admitting: *Deleted

## 2009-09-01 ENCOUNTER — Encounter: Payer: Self-pay | Admitting: Family Medicine

## 2009-09-03 ENCOUNTER — Telehealth: Payer: Self-pay | Admitting: Family Medicine

## 2009-09-07 ENCOUNTER — Encounter: Payer: Self-pay | Admitting: Family Medicine

## 2009-09-07 ENCOUNTER — Ambulatory Visit: Payer: Self-pay | Admitting: Family Medicine

## 2009-09-07 LAB — CONVERTED CEMR LAB
BUN: 26 mg/dL — ABNORMAL HIGH (ref 6–23)
Chloride: 100 meq/L (ref 96–112)
Cholesterol: 143 mg/dL (ref 0–200)
Creatinine, Ser: 1.24 mg/dL — ABNORMAL HIGH (ref 0.40–1.20)
Glucose, Bld: 135 mg/dL — ABNORMAL HIGH (ref 70–99)
HCT: 36.9 % (ref 36.0–46.0)
Hemoglobin: 11.4 g/dL — ABNORMAL LOW (ref 12.0–15.0)
LDL Cholesterol: 69 mg/dL (ref 0–99)
MCHC: 30.9 g/dL (ref 30.0–36.0)
Potassium: 4.9 meq/L (ref 3.5–5.3)
RBC: 3.78 M/uL — ABNORMAL LOW (ref 3.87–5.11)
VLDL: 28 mg/dL (ref 0–40)

## 2009-09-13 ENCOUNTER — Encounter: Payer: Self-pay | Admitting: Family Medicine

## 2009-09-13 ENCOUNTER — Encounter: Payer: Self-pay | Admitting: *Deleted

## 2009-09-14 ENCOUNTER — Ambulatory Visit: Payer: Self-pay | Admitting: Family Medicine

## 2009-09-23 ENCOUNTER — Encounter: Payer: Self-pay | Admitting: Family Medicine

## 2009-09-23 ENCOUNTER — Ambulatory Visit: Payer: Self-pay | Admitting: Family Medicine

## 2009-10-01 ENCOUNTER — Encounter: Payer: Self-pay | Admitting: Family Medicine

## 2009-10-01 ENCOUNTER — Telehealth: Payer: Self-pay | Admitting: Family Medicine

## 2009-10-06 ENCOUNTER — Encounter: Payer: Self-pay | Admitting: Family Medicine

## 2009-11-30 ENCOUNTER — Telehealth: Payer: Self-pay | Admitting: Family Medicine

## 2009-12-08 ENCOUNTER — Telehealth: Payer: Self-pay | Admitting: Family Medicine

## 2009-12-29 ENCOUNTER — Encounter: Payer: Self-pay | Admitting: *Deleted

## 2009-12-30 ENCOUNTER — Encounter: Payer: Self-pay | Admitting: Family Medicine

## 2009-12-31 ENCOUNTER — Telehealth: Payer: Self-pay | Admitting: Family Medicine

## 2010-01-04 ENCOUNTER — Ambulatory Visit: Payer: Self-pay | Admitting: Family Medicine

## 2010-01-04 ENCOUNTER — Encounter: Payer: Self-pay | Admitting: Family Medicine

## 2010-01-04 DIAGNOSIS — R269 Unspecified abnormalities of gait and mobility: Secondary | ICD-10-CM

## 2010-01-04 LAB — CONVERTED CEMR LAB: Hgb A1c MFr Bld: 7.1 %

## 2010-01-05 LAB — CONVERTED CEMR LAB
Calcium: 9.4 mg/dL (ref 8.4–10.5)
Potassium: 4.3 meq/L (ref 3.5–5.3)
Sodium: 141 meq/L (ref 135–145)

## 2010-01-28 IMAGING — CR DG TIBIA/FIBULA 2V*R*
2 series · 2 of 2 positions shown · non-contrast
Comparison: None

CLINICAL DATA: Fall, right leg pain

RIGHT TIBIA AND FIBULA - 2 VIEW

[view not recorded (1 of 2)]
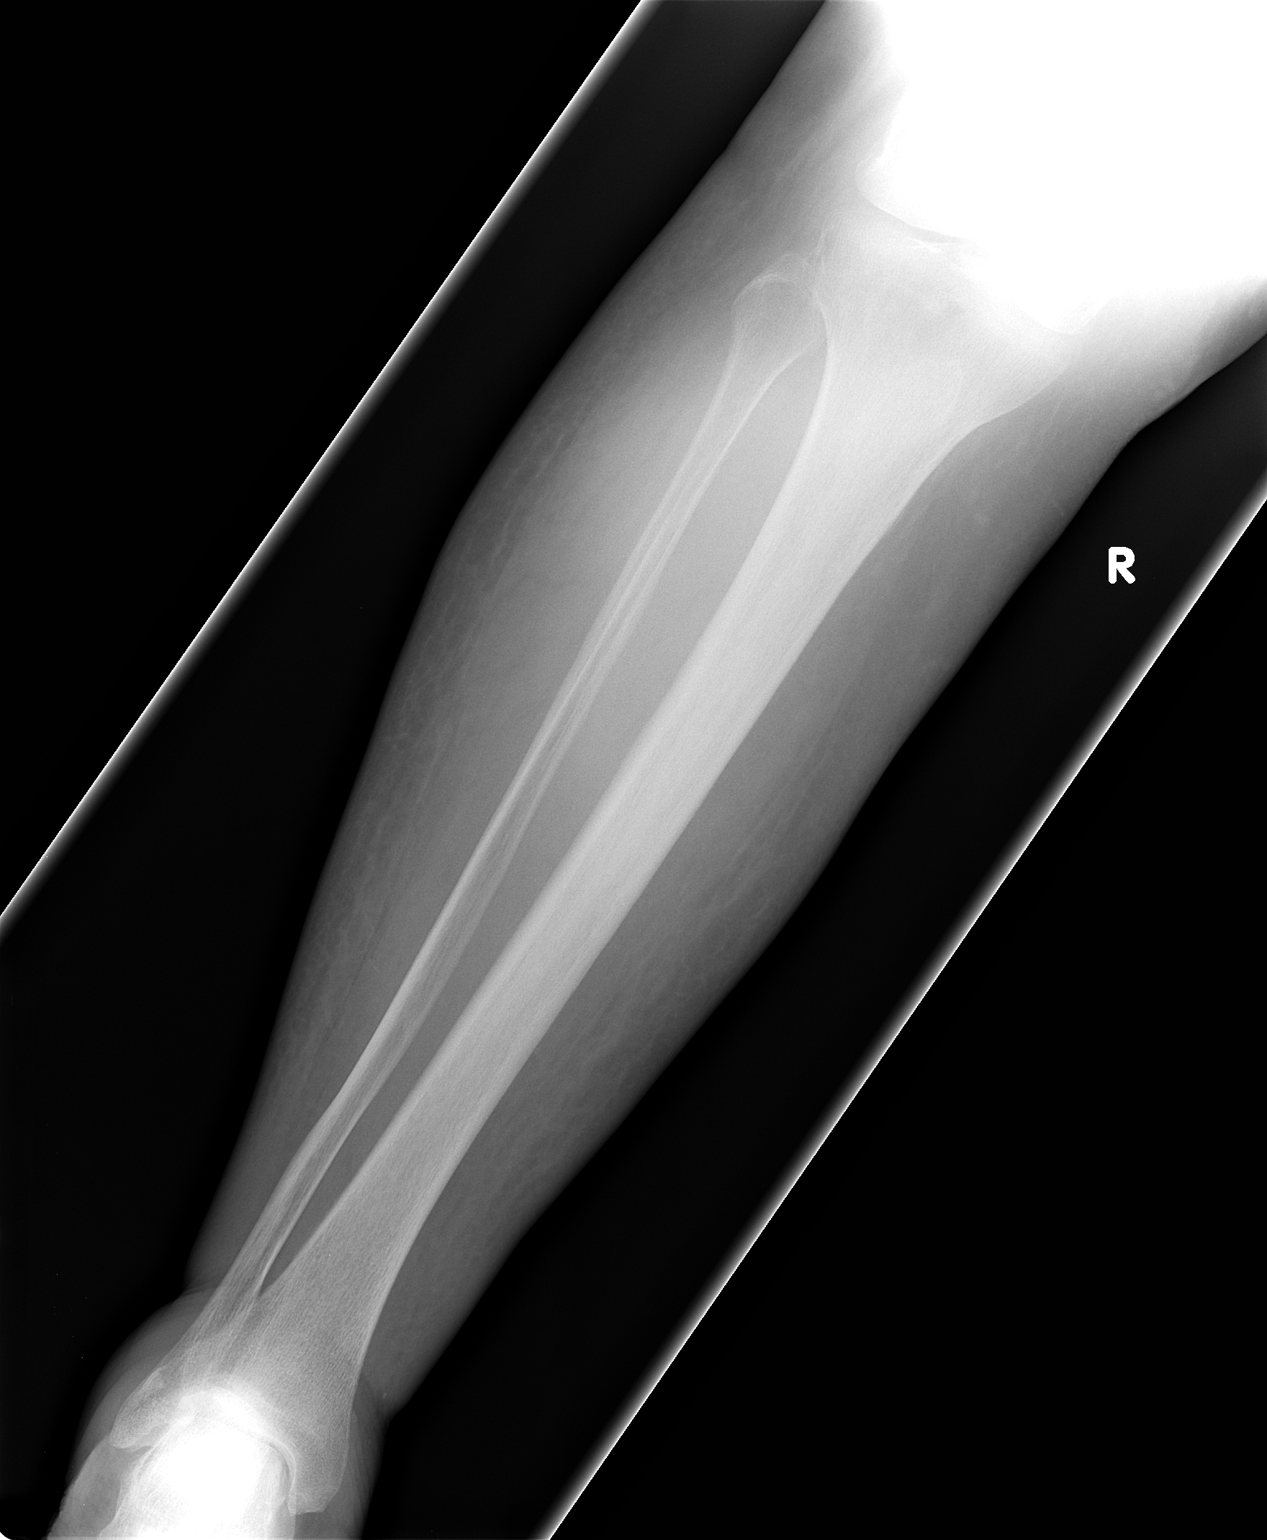

[view not recorded (2 of 2)]
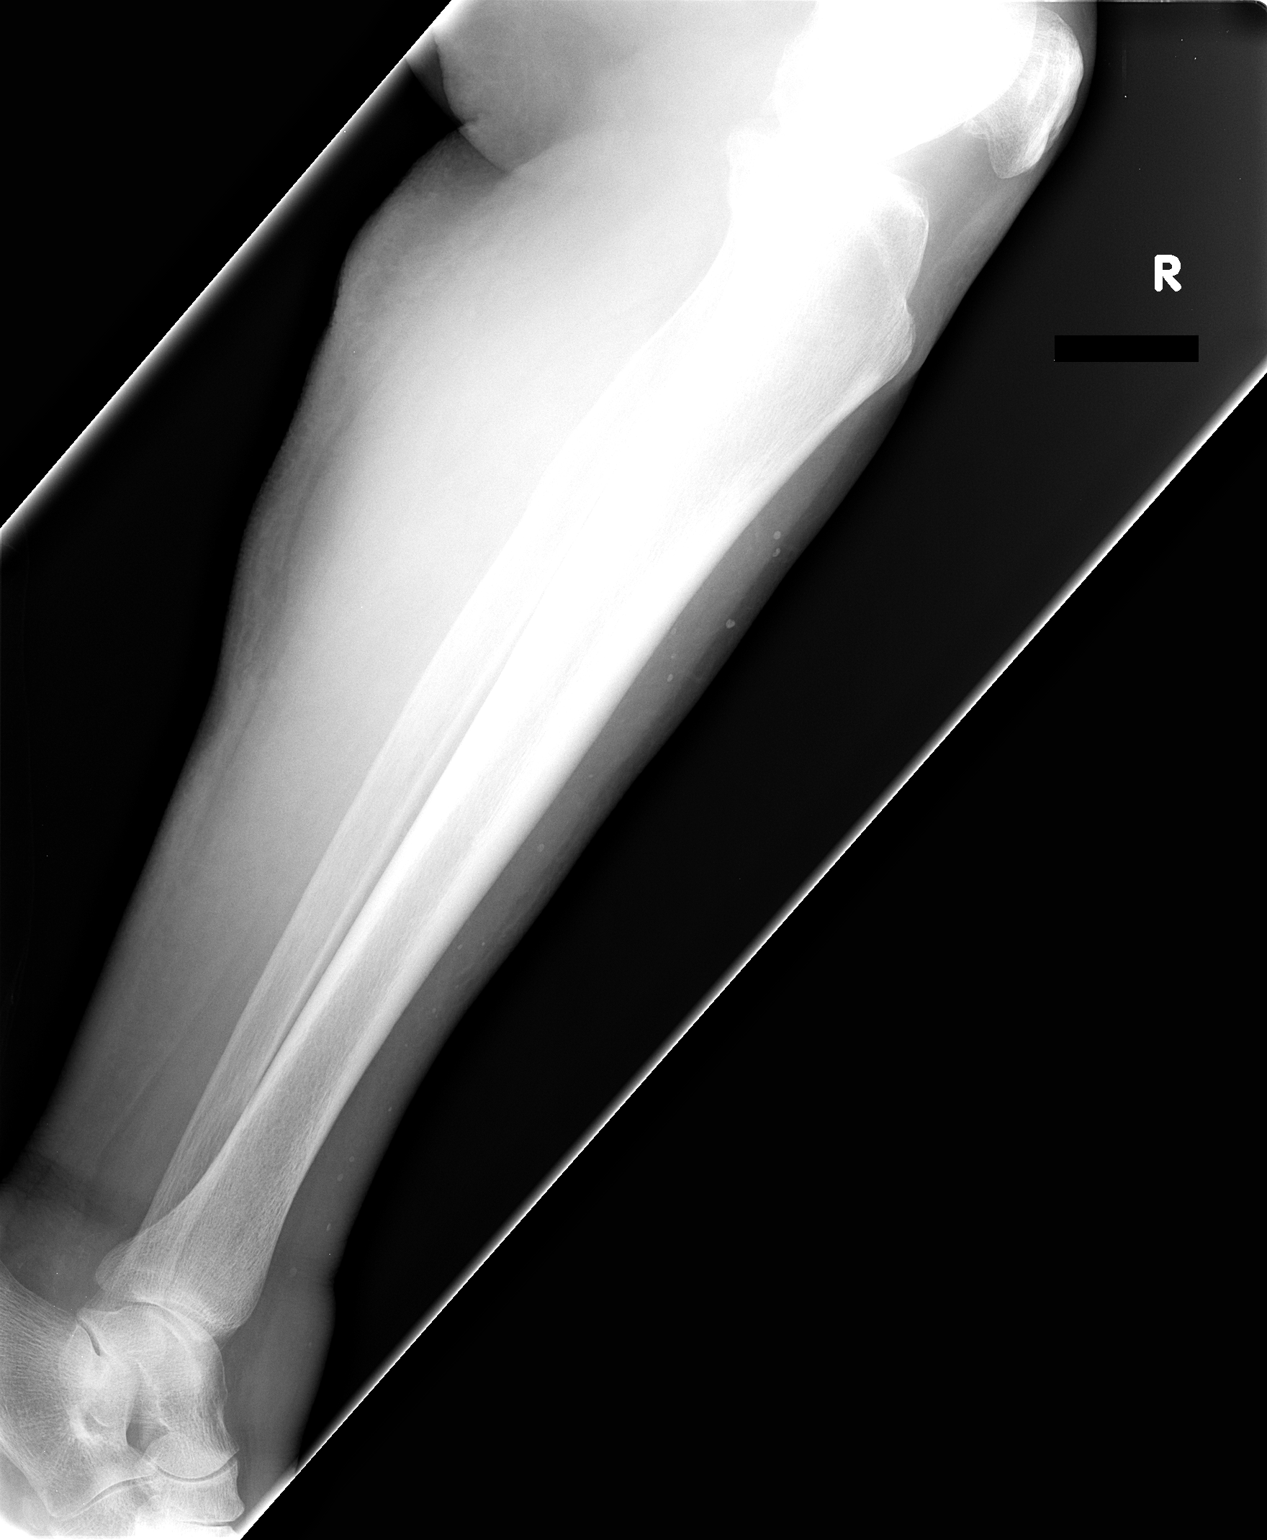

[2 of 2 positions shown; findings below may reference images not displayed]

FINDINGS: Bones are osteopenic, which may mask subtle fracture.
Mild soft tissue edema noted without focal abnormality.
Phleboliths project over the anterior soft tissues.
IMPRESSION: No focal acute bony abnormality.  Diffuse mild soft tissue edema.

## 2010-01-28 IMAGING — CR DG FOOT COMPLETE 3+V*R*
3 series · 3 of 3 positions shown · non-contrast
Comparison: None

CLINICAL DATA: Fall, foot pain

RIGHT FOOT COMPLETE - 3+ VIEW

[view not recorded (1 of 3)]
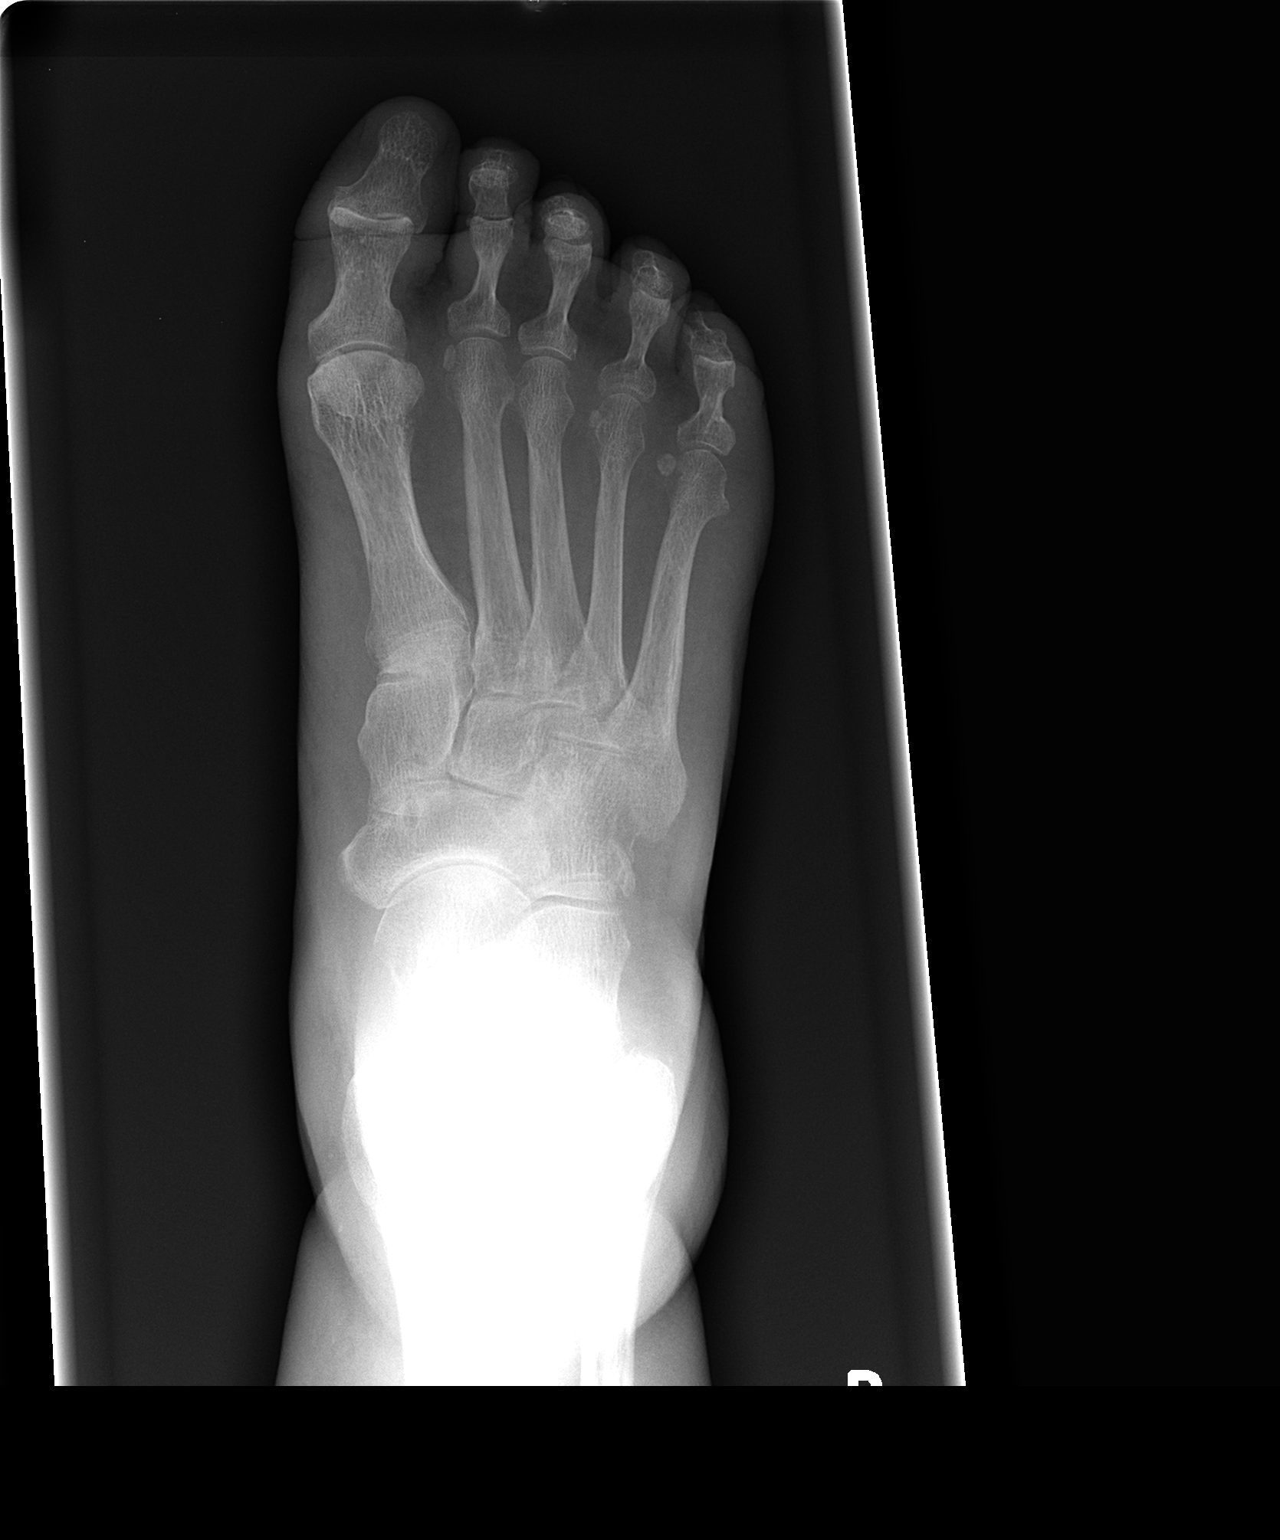

[view not recorded (2 of 3)]
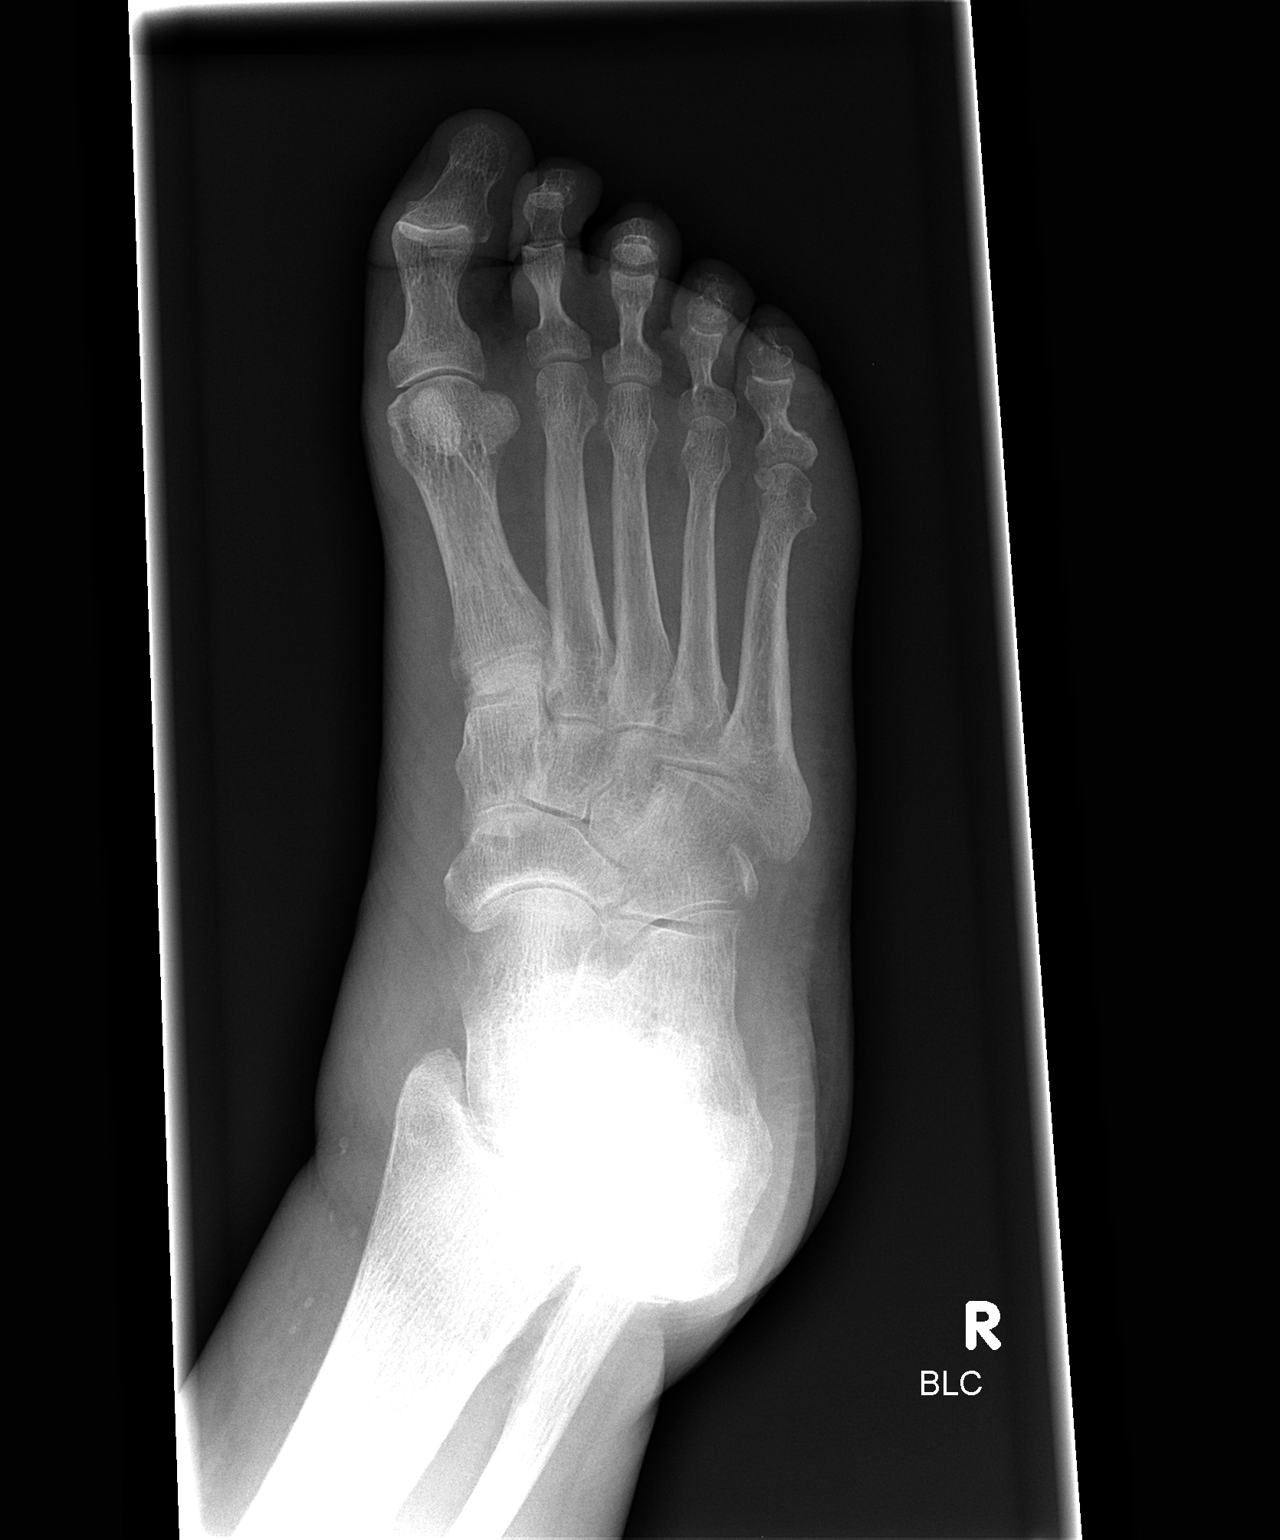

[view not recorded (3 of 3)]
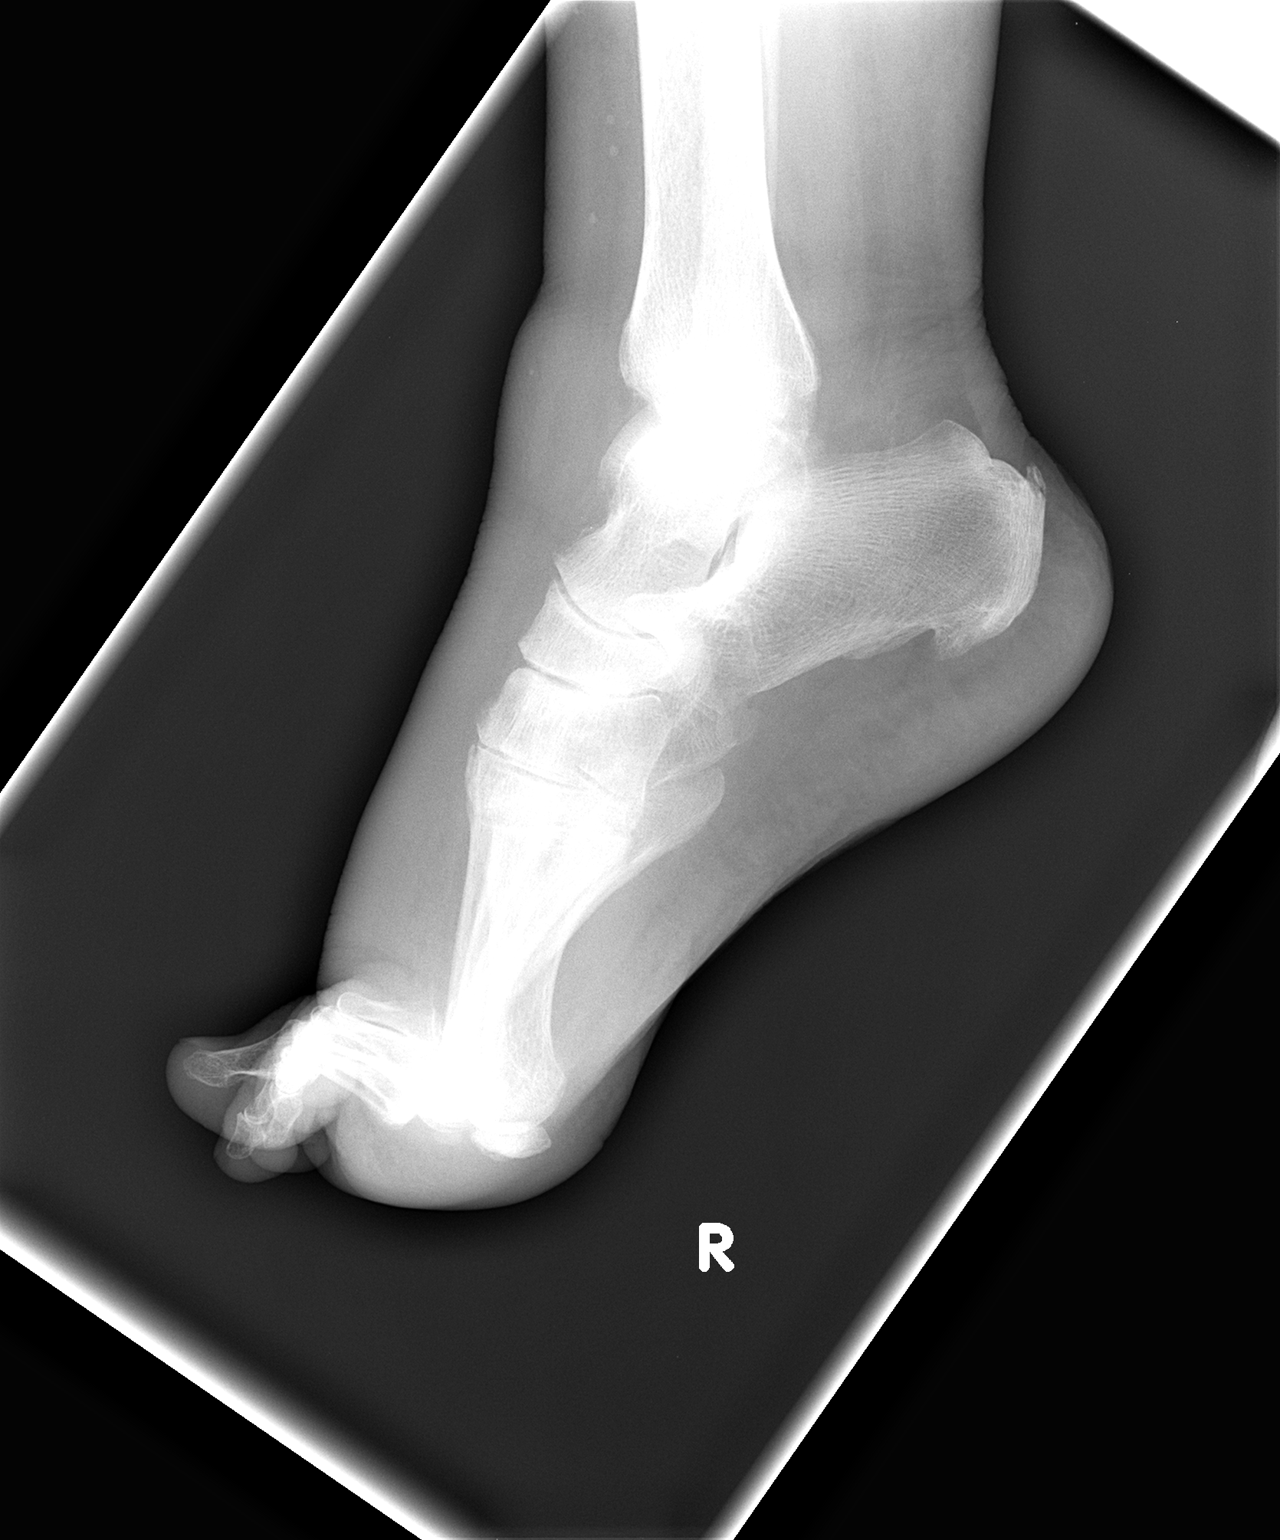

[3 of 3 positions shown; findings below may reference images not displayed]

FINDINGS: Bones are somewhat osteopenic.  No displaced fracture is
seen.  No soft tissue abnormality.  Plantar calcaneal spurring is
noted.  The distal phalanges are not well evaluated due to
positioning.
IMPRESSION: No focal acute osseous abnormality.  If symptoms persist, consider
re-imaging in 7-10 days or MRI if needed.

## 2010-02-02 ENCOUNTER — Encounter: Payer: Self-pay | Admitting: Family Medicine

## 2010-02-04 ENCOUNTER — Telehealth: Payer: Self-pay | Admitting: Family Medicine

## 2010-02-22 ENCOUNTER — Emergency Department (HOSPITAL_COMMUNITY): Admission: EM | Admit: 2010-02-22 | Discharge: 2010-02-23 | Payer: Self-pay | Admitting: Emergency Medicine

## 2010-02-24 HISTORY — PX: TOTAL KNEE ARTHROPLASTY: SHX125

## 2010-03-02 ENCOUNTER — Telehealth: Payer: Self-pay | Admitting: Family Medicine

## 2010-03-08 ENCOUNTER — Telehealth: Payer: Self-pay | Admitting: *Deleted

## 2010-03-09 ENCOUNTER — Encounter: Payer: Self-pay | Admitting: Family Medicine

## 2010-03-10 ENCOUNTER — Ambulatory Visit: Payer: Self-pay | Admitting: Family Medicine

## 2010-03-10 DIAGNOSIS — M48061 Spinal stenosis, lumbar region without neurogenic claudication: Secondary | ICD-10-CM

## 2010-03-10 LAB — CONVERTED CEMR LAB
BUN: 28 mg/dL — ABNORMAL HIGH (ref 6–23)
Blood in Urine, dipstick: NEGATIVE
Calcium: 9.5 mg/dL (ref 8.4–10.5)
Ferritin: 31 ng/mL (ref 10–291)
Glucose, Bld: 158 mg/dL — ABNORMAL HIGH (ref 70–99)
Glucose, Urine, Semiquant: NEGATIVE
HCT: 37.8 % (ref 36.0–46.0)
Hemoglobin: 11.9 g/dL — ABNORMAL LOW (ref 12.0–15.0)
Ketones, urine, test strip: NEGATIVE
MCHC: 31.5 g/dL (ref 30.0–36.0)
MCV: 95.2 fL (ref 78.0–100.0)
Potassium: 4.7 meq/L (ref 3.5–5.3)
RBC: 3.97 M/uL (ref 3.87–5.11)
Specific Gravity, Urine: 1.015
pH: 6

## 2010-03-11 ENCOUNTER — Encounter: Payer: Self-pay | Admitting: Family Medicine

## 2010-03-21 ENCOUNTER — Telehealth: Payer: Self-pay | Admitting: Family Medicine

## 2010-03-22 ENCOUNTER — Telehealth: Payer: Self-pay | Admitting: Family Medicine

## 2010-03-24 ENCOUNTER — Telehealth: Payer: Self-pay | Admitting: Family Medicine

## 2010-03-26 ENCOUNTER — Encounter: Admission: RE | Admit: 2010-03-26 | Discharge: 2010-03-26 | Payer: Self-pay | Admitting: Family Medicine

## 2010-03-28 ENCOUNTER — Telehealth: Payer: Self-pay | Admitting: Family Medicine

## 2010-03-31 ENCOUNTER — Ambulatory Visit: Payer: Self-pay | Admitting: Family Medicine

## 2010-03-31 DIAGNOSIS — M810 Age-related osteoporosis without current pathological fracture: Secondary | ICD-10-CM | POA: Insufficient documentation

## 2010-03-31 DIAGNOSIS — S3210XA Unspecified fracture of sacrum, initial encounter for closed fracture: Secondary | ICD-10-CM | POA: Insufficient documentation

## 2010-03-31 DIAGNOSIS — S322XXA Fracture of coccyx, initial encounter for closed fracture: Secondary | ICD-10-CM

## 2010-03-31 DIAGNOSIS — E559 Vitamin D deficiency, unspecified: Secondary | ICD-10-CM | POA: Insufficient documentation

## 2010-04-01 ENCOUNTER — Telehealth: Payer: Self-pay | Admitting: Family Medicine

## 2010-04-01 ENCOUNTER — Encounter: Payer: Self-pay | Admitting: *Deleted

## 2010-04-06 ENCOUNTER — Encounter: Payer: Self-pay | Admitting: Family Medicine

## 2010-04-06 ENCOUNTER — Observation Stay (HOSPITAL_COMMUNITY)
Admission: EM | Admit: 2010-04-06 | Discharge: 2010-04-07 | Payer: Self-pay | Source: Home / Self Care | Admitting: Emergency Medicine

## 2010-04-08 ENCOUNTER — Telehealth: Payer: Self-pay | Admitting: Family Medicine

## 2010-04-11 ENCOUNTER — Encounter: Payer: Self-pay | Admitting: Family Medicine

## 2010-04-11 ENCOUNTER — Ambulatory Visit: Payer: Self-pay | Admitting: Family Medicine

## 2010-04-11 ENCOUNTER — Telehealth: Payer: Self-pay | Admitting: Family Medicine

## 2010-04-11 ENCOUNTER — Inpatient Hospital Stay (HOSPITAL_COMMUNITY): Admission: EM | Admit: 2010-04-11 | Discharge: 2010-04-15 | Payer: Self-pay | Admitting: Emergency Medicine

## 2010-04-12 ENCOUNTER — Encounter: Payer: Self-pay | Admitting: Family Medicine

## 2010-04-12 ENCOUNTER — Telehealth: Payer: Self-pay | Admitting: Family Medicine

## 2010-04-19 ENCOUNTER — Ambulatory Visit: Payer: Self-pay | Admitting: Family Medicine

## 2010-04-26 ENCOUNTER — Encounter (INDEPENDENT_AMBULATORY_CARE_PROVIDER_SITE_OTHER): Payer: Self-pay | Admitting: Pharmacist

## 2010-04-27 ENCOUNTER — Encounter: Payer: Self-pay | Admitting: Family Medicine

## 2010-05-30 ENCOUNTER — Telehealth: Payer: Self-pay | Admitting: Family Medicine

## 2010-06-07 ENCOUNTER — Ambulatory Visit: Payer: Self-pay | Admitting: Family Medicine

## 2010-06-26 HISTORY — PX: CATARACT EXTRACTION W/ INTRAOCULAR LENS IMPLANT: SHX1309

## 2010-06-28 ENCOUNTER — Telehealth: Payer: Self-pay | Admitting: Family Medicine

## 2010-06-28 ENCOUNTER — Encounter: Payer: Self-pay | Admitting: Family Medicine

## 2010-07-05 ENCOUNTER — Ambulatory Visit: Admit: 2010-07-05 | Payer: Self-pay

## 2010-07-18 ENCOUNTER — Encounter: Payer: Self-pay | Admitting: Family Medicine

## 2010-07-19 ENCOUNTER — Ambulatory Visit
Admission: RE | Admit: 2010-07-19 | Discharge: 2010-07-19 | Payer: Self-pay | Source: Home / Self Care | Attending: Family Medicine | Admitting: Family Medicine

## 2010-07-19 ENCOUNTER — Encounter: Payer: Self-pay | Admitting: Family Medicine

## 2010-07-19 DIAGNOSIS — M1A00X0 Idiopathic chronic gout, unspecified site, without tophus (tophi): Secondary | ICD-10-CM | POA: Insufficient documentation

## 2010-07-19 LAB — CONVERTED CEMR LAB
CO2: 26 meq/L (ref 19–32)
Glucose, Bld: 135 mg/dL — ABNORMAL HIGH (ref 70–99)
HCT: 37.8 % (ref 36.0–46.0)
Hemoglobin: 12 g/dL (ref 12.0–15.0)
MCV: 93.8 fL (ref 78.0–100.0)
Platelets: 181 10*3/uL (ref 150–400)
Potassium: 4.9 meq/L (ref 3.5–5.3)
Sodium: 140 meq/L (ref 135–145)
WBC: 7.3 10*3/uL (ref 4.0–10.5)

## 2010-07-21 ENCOUNTER — Encounter: Payer: Self-pay | Admitting: Family Medicine

## 2010-07-26 NOTE — Miscellaneous (Signed)
Summary: [facility]  [facility]   Imported By: Clydell Hakim 04/07/2010 16:26:07  _____________________________________________________________________  External Attachment:    Type:   Image     Comment:   External Document

## 2010-07-26 NOTE — Progress Notes (Signed)
Summary: needs help   Phone Note Call from Patient Call back at Home Phone [phone]   Caller: Patient Summary of Call: pt wanted to tell Dr Sheffield Slider that she needed in home care and wants to use [facility]  Initial call taken by: De Nurse,  April 01, 2010 12:23 PM  Follow-up for Phone Call        will fwd. to Dr.Hale for review Follow-up by: Arlyss Repress CMA,,  April 01, 2010 2:54 PM  Additional Follow-up for Phone Call Additional follow up Details #1::        Go ahead and make the referral for PT/OT diagnosis osteoporosis and sacral fracture Additional Follow-up by: Zachery Dauer MD,  April 01, 2010 3:01 PM

## 2010-07-26 NOTE — Progress Notes (Signed)
Summary: phn msg   Phone Note Call from Patient Call back at [facility] Phone [phone]   Caller: Patient Summary of Call: Pt requesting to have nurse continue coming out to see her for another month.  The nurse is with [facility].  Also pt says she needs more pull-ups ordered for her. Initial call taken by: Clydell Hakim,  September 03, 2009 2:58 PM  Follow-up for Phone Call        fwd. to dr.Stevey Stapleton Follow-up by: Arlyss Repress CMA,,  September 03, 2009 5:12 PM  Additional Follow-up for Phone Call Additional follow up Details #1::        I OK that Additional Follow-up by: Zachery Dauer MD,  September 06, 2009 9:40 AM

## 2010-07-26 NOTE — Miscellaneous (Signed)
Summary: DME form completed for incontinence briefs  Form signed for [facility] and put in fax pile

## 2010-07-26 NOTE — Progress Notes (Signed)
----   Converted from flag ---- ---- 03/08/2010 10:22 AM, Zachery Dauer MD wrote: Please call Mrs Daigneault this morning to schedule an appointment in [place] [facility] in 2 days. ------------------------------  called pt, asked her to call front office and schedule appt with geri this thursday.

## 2010-07-26 NOTE — Progress Notes (Signed)
Summary: needs rx   Phone Note Call from Patient Call back at [phone]   Caller: Patient Summary of Call: needs an rx for incontenence pull ups to [facility] Initial call taken by: De Nurse,  October 01, 2009 3:51 PM  Follow-up for Phone Call        she states [facility] needs a rx. told her usually we get a form for pcp to sign. she has enough to get thru the weekend. to pcp- I can call it to [facility] if desired Follow-up by: Golden Circle RN,  October 01, 2009 3:54 PM  Additional Follow-up for Phone Call Additional follow up Details #1::        OK to call them in Additional Follow-up by: Zachery Dauer MD,  October 01, 2009 4:12 PM     Appended Document: needs rx [facility] states her services ended yesterday. they asked for a rx, demographics, problem list be faxed. md wrote rx & requested items faxed

## 2010-07-26 NOTE — Progress Notes (Signed)
Summary: re: labs/ts  ---- Converted from flag ---- ---- 08/30/2009 9:54 AM, Zachery Dauer MD wrote: Please ask Mrs Glauser to come in fasting 1-2 weeks before her Mar 24th visit for fasting labs ------------------------------  called pt. pt will call after checking, when she can come in. advised to have lab appt only and npo after midnight except water and she can take her meds. pt verbalized understanding.Marland Kitchen

## 2010-07-26 NOTE — Progress Notes (Signed)
   Phone Note Call from Patient   Caller: Patient Summary of Call: took blood pressure at home and it was 120/36 on her home cuff.  not having any problems, but was worried because bottom number was low.  not having any symptoms:  no chest pain, shortness of breath, or lightheaded.  advised to call back if she does have any symtpoms.  otherwise, call [facility] in the morning to make a nurse visit appt to have her cuff compared to [facility] cuff for accuracy Initial call taken by: Asher Muir MD,  November 30, 2009 9:25 PM

## 2010-07-26 NOTE — Miscellaneous (Signed)
Summary: DME form signed for incontinence supplies   Clinical Lists Changes   signed and placed in box to [facility] to [facility]

## 2010-07-26 NOTE — Letter (Signed)
Summary: Results Follow-up Letter  [facility]  [address]   [place], [place] [address]   Phone: [phone]  Fax: [phone]    03/11/2010  [address]  [name], [place]  [address]  Dear Ms. Wiechmann,   The following are the results of your recent test(s): Patient: [name] [name]  Your iron levels are on the low end of norma. Tests: (1) Iron and IBC (2390)   Iron                      48 ug/dL                    57-846   UIBC                      297 ug/dL   TIBC                      345 ug/dL                   962-952   %SAT                 [L]  14 %                        20-55  Tests: (2) CBC NO Diff (Complete Blood Count) (10000)   WBC                  [H]  10.7 K/uL                   4.0-10.5   RBC                       3.97 MIL/uL                 3.87-5.11   Hemoglobin           [L]  11.9 g/dL                   84.1-32.4   Hematocrit                37.8 %                      36.0-46.0   MCV                       95.2 fL                     78.0-100.0 ! MCH                       30.0 pg                     26.0-34.0   MCHC                      31.5 g/dL                   40.1-02.7   RDW                       15.2 %  11.5-15.5   Platelet Count            377 K/uL                    150-400  Tests: (3) Basic Metabolic Panel ([address])   Sodium                    139 mEq/L                   135-145   Potassium                 4.7 mEq/L                   3.5-5.3   Chloride                  99 mEq/L                    96-112   CO2                       26 mEq/L                    19-32   Glucose              [H]  158 mg/dL                   60-45   BUN                  [H]  28 mg/dL                    4-09   Creatinine           [H]  1.45 mg/dL                  0.40-1.20   Calcium                   9.5 mg/dL                   8.1-19.1  Tests: (4) Ferritin (478[address])   Ferritin                  31 ng/mL                     10-291  Tests: (5) Vitamin D (25-Hydroxy) (56213)  Vitamin D (25-Hydroxy)                        [L]  24 ng/mL                    30-89 We'll talk about iron and Vitamin D supplements on your appointment Thursday Oct 6th. Please bring all of your pill bottles to that visit.   Document Creation Date: 03/11/2010 12:22 AM Sincerely,  Zachery Dauer MD [facility]            Appended Document: Results Follow-up Letter mailed.

## 2010-07-26 NOTE — Assessment & Plan Note (Signed)
Summary: Hospital Admission H & P   Vital Signs:  Patient profile:   77 year old female O2 Sat:      93 % on Room air Temp:     98.8 degrees F Pulse rate:   71 / minute Resp:     20 per minute BP supine:   121 / 41  O2 Flow:  Room air  Primary Care Provider:  Zachery Dauer MD   History of Present Illness: CC: Weakness and falls HPI: History provided by pt and her three daughters.  Pt says that she has not been feeling well since Friday.  She says her lips and face feel swollen and her lips feel cracked.  Her daughters say she has been disoriented for several days.    They report that pt. fell several weeks ago and has a crack in her tailbone which is taking a long time to heal.  She saw Dr. Sheffield Slider on October 6th and he perscribed Oxycontin and Oxycodone for the pain.  Pt says she has fallen several times since then, reports her knees giving out and says that she sometimes feels like she can't pick up her right leg.    Pt. denies any urinary symptoms and family reports she ran out of her lasix a few days ago.   Allergies: 1)  ! Enalapril Maleate (Enalapril Maleate) 2)  Remeron (Mirtazapine)  Past History:  Past Medical History: Last updated: 09-04-08  1. Type 2 diabetes.   2. Hypertension.   3. Hyperlipidemia.   4. History of urinary frequency and incontinence.   5. Osteoarthritis.   6. Obesity.   7. Insomnia.   8. Depressive disorder.   9. Gastroesophageal reflux disease.   10.Sleep apnea.  11.. PVD     TSH nl - 09/24/2001 Hematuria, neg culture 2/07  Past Surgical History: Last updated: 08/17/2009 urethrocystocopy normal - 09/11/2005 left eye cataract replacement with intraoccular lens. knee replacement   Family History: Last updated: 06/18/2007 Alc - daughter and son DM - M, daughter, sister, heart valve problem  sister, MI - M age 77 Bro age 70 Sis died of heart problem 06/27/2023, had Alzheimers.  Social History: Last updated: Sep 04, 2008 Sister, Johnny Bridge,  died 8 widowed, 05/01/96 Son lives with her Has custody of Riki Rusk and Herbert Seta, their  Mother is Tilda Franco died in her home of a OD on Mar 21, 2023 Tobacco Use - No.  Alcohol Use - no  Review of Systems       The patient complains of difficulty walking.  The patient denies fever, chest pain, syncope, and dyspnea on exertion.    Physical Exam  General:  Alert, NAD Head:  Normocephalic Eyes:  PEERL, EOMIT Ears:  Dried blood in ear canals.  Mouth:  Dentures.  Msk:  Strength 5/5 and equal in upper extremities.  Hip flexion 4/5 strength bilaterally, Dorsiflextion and plantarflexion both 5/5 bilaterally.  Extremities:  Pt. with a few lacerations and bruising on right lower leg, all hemostatic.    Complete Medication List: 1)  Bayer Aspirin 325 Mg Tabs (Aspirin) .... Take 1 tablet by mouth once a day 2)  Furosemide 40 Mg Tabs (Furosemide) .... Take 1 tablet by mouth once a day 3)  Glucotrol 5 Mg Tabs (Glipizide) .... One-half daily 4)  Simvastatin 20 Mg Tabs (Simvastatin) .... Take 1 tablet by mouth at bedtime 5)  Toprol Xl 50 Mg Tb24 (Metoprolol succinate) .... One daily 6)  Colchicine 0.6 Mg Tabs (Colchicine) .... Two times a day  as needed for gouty attacks 7)  Celexa 40 Mg Tabs (Citalopram hydrobromide) .... One daily 8)  Allopurinol 100 Mg Tabs (Allopurinol) .... Take 2 tabs daily 9)  Omeprazole 20 Mg Tbec (Omeprazole) .... One daily 10)  Metformin Hcl 500 Mg Tabs (Metformin hcl) .... Take 2 tabs in am and one tab in p.m. 11)  Tramadol Hcl 50 Mg Tabs (Tramadol hcl) .... Take one or two tablets every 6 hours as needed 12)  Vitamin B-12 Cr 1500 Mcg Tbcr (Cyanocobalamin) .... Monthly 13)  Calcium 500/d 500-200 Mg-unit Tabs (Calcium carbonate-vitamin d) .... One by mouth three times a day 14)  Nystatin 100000 Unit/gm Powd (Nystatin) 15)  Fish Oil 1000 Mg Caps (Omega-3 fatty acids) .... Ran out 16)  Fluticasone Propionate 50 Mcg/act Susp (Fluticasone propionate) .... Two sprays each  nostil once daily 17)  Lorazepam 0.5 Mg Tabs (Lorazepam) .... One two times a day as needed for anxiety 18)  Diclofenac Sodium 75 Mg Tbec (Diclofenac sodium) .... Take one tab two times a day 19)  Hydrocortisone 2.5 % Crea (Hydrocortisone) .... Apply bid 20)  Oxybutynin Chloride 5 Mg Xr24h-tab (Oxybutynin chloride) .... Take one tablet daily 21)  Oxycodone Hcl 5 Mg Tabs (Oxycodone hcl) .... Take one to two tabs every 4 hour as needed pain 22)  Calcitonin (salmon) 200 Unit/act Soln (Calcitonin (salmon)) .... One spray daily in alternating nostrils 23)  Lyrica 150 Mg Caps (Pregabalin) .... Once daily 24)  Ergocalciferol 50000 Unit Caps (Ergocalciferol) .... Take one tablet weeekly for 8 weeks 25)  Alendronate Sodium 70 Mg Tabs (Alendronate sodium) .... One tablet weekly 26)  Oxycontin 20 Mg Xr12h-tab (Oxycodone hcl) .... Take one tablet twice a day for pain 27)  Senokot 8.6 Mg Tabs (Sennosides) .... Oone to two tabs daily for constipation  Labs and Studies:  CBC with Diff: WBC                                      6.9               4.0-10.5         K/uL  RBC                                      3.56       l      3.87-5.11        MIL/uL  Hemoglobin (HGB)                         10.6       l      12.0-15.0        g/dL  Hematocrit (HCT)                         34.1       l      36.0-46.0        %  MCV                                      95.8              78.0-100.0       fL  MCH -                                    29.8              26.0-34.0        pg  MCHC                                     31.1              30.0-36.0        g/dL  RDW                                      15.2              11.5-15.5        %  Platelet Count (PLT)                     207               150-400          K/uL  Neutrophils, %                           73                43-77            %  Lymphocytes, %                           17                12-46            %  Monocytes, %                             4                  3-12             %  Eosinophils, %                           6          h      0-5              %  Basophils, %                             0                 0-1              %  Neutrophils, Absolute                    5.0               1.7-7.7          K/uL  Lymphocytes, Absolute  1.2               0.7-4.0          K/uL  Monocytes, Absolute                      0.3               0.1-1.0          K/uL  Eosinophils, Absolute                    0.4               0.0-0.7          K/uL  Basophils, Absolute                      0.0               0.0-0.1          K/uL  Urinalysis:  Result Name                              Result     Abnl   Normal Range     Units      Perf. Loc.  Color, Urine                             YELLOW            YELLOW  Appearance                               CLOUDY     a      CLEAR  Specific Gravity                         1.018             1.005-1.030  pH                                       6.5               5.0-8.0  Urine Glucose                            NEGATIVE          NEG              mg/dL  Bilirubin                                NEGATIVE          NEG  Ketones                                  NEGATIVE          NEG              mg/dL  Blood  NEGATIVE          NEG  Protein                                  NEGATIVE          NEG              mg/dL  Urobilinogen                             1.0               0.0-1.0          mg/dL  Nitrite                                  POSITIVE   a      NEG  Leukocytes                               SMALL      a      NEG Micro: Squamous Epithelial / LPF                RARE              RARE  WBC / HPF                                11-20             <3               WBC/hpf  RBC / HPF                                0-2               <3               RBC/hpf  Bacteria / HPF                           MANY       a      RARE  BMET: Sodium (NA)                               139               135-145          mEq/L  Potassium (K)                            4.4               3.5-5.1          mEq/L  Chloride                                 105               96-112  mEq/L  CO2                                      29                19-32            mEq/L  Glucose                                  163        h      70-99            mg/dL  BUN                                      19                6-23             mg/dL  Creatinine                               1.18              0.4-1.2          mg/dL  GFR, Est Non African American            45         l      >60              mL/min  GFR, Est African American                54         l      >60              mL/min    Oversized comment, see footnote  1  Calcium                                  8.4               8.4-10.5         mg/dL  CXR:  No definite pneumonia or congestive heart failure.  A/P: 77 year old female with MMP who presented for weakness and falls found to have UTI:  1) Weakness/Falls- Most likely due to increase in pain medication dosages, however pt has had several falls in the past few weeks and is not safe at home as she does not have enough help getting up to the bathroom and moving around her home. We will decrease her pain medication regimen to OxyContin 10, hold oxycodone, and hold diclofenac. Consult PT to evaluate and make therapy and placement recs.   2) UTI-pt asymptomatic will treat with Keflex 500 mg by mouth two times a day. Will send urine culture.  3) Swelling/tingling- no focal neuro deficits, pt stopped lasix a few days ago.  We will place her back on her home dose of 40 by mouth daily.   3) Hypertension - cont. home meds.   4) DM- Continue home meds.   5) Hyperlipidemia - cont. home meds.  6) Insomnia - cont.  home meds.  7) Depressive disorder - cont. home meds.  8) Gastroesophageal reflux disease - cont. home meds.  9) Sleep apnea - pt. does not wear a CPAP  machine at night, so we will not order one for her here.   10) PVD- cont. home meds.  11) Gout- continue allopurinol, hold cholchicine  12) Skin- pt complaining of rash under breasts, will give nystatin powder.   13) FEN/GI- Diabetic diet, NS@ 75 cc/hr. Will hold home vitamin B12 and calcium in house. Continue Omeprazole.   14) prophylaxis- Heparin 5000 units Subcutaneously three times a day   15) Disposition- pending clinical improvement.   Appended Document: Hospital Admission H & P This patient was seen and discussed with Dr. Mauricio Po and Dr. Lula Olszewski. I agree with Dr. Melina Modena findings, and plans.  In short,  This ia a 77 y/o F who comes in with 3 of her daughters after feeling week and dizzy for about 4 days. She was recently started on Oxycontin and Oxycodone after being on Tramadol for low back pain (pt has fractured her coccyx). She has been feeling more dizzy since that time. However she was experiencing some falls onto her knees while using her walker even before she was put on the new meds. The patient has not felt any chest pain, SOB, urinary burning or discomfort, she has frequency but this is baseline for her. She does feel like her face has some swelling; specifically her lips.   PMH, PSH, FM, SH, ROS, VS, Labs all per PGY1 note.   Pertinent PE: GEN: Pt appears weak, laying in bed, will awaken and is oriented x 3.  HEENT: WNl, see above note, dried blood in bilateral ears.  MSK: Pt is slightly weak in her lower extremities bilaterally. She has good strength in her upper extremities.  Neuro: CN 2-12 normal, equal strength, no deficits noted except slight decrease in strength in the Rt leg upon leg raise.    A/P: This is a 77 year old F who is here for weakness and dizziness and found to have a UTI 1: Weakness and dizziness: Pt has been feeling dizzy since she started the oxycondone/oxycontin combo. She has been feeling weak for several weeks. She has been seen by her  PCP and a PT/OT referral was made; but has not been done yet. She lives at home with her son and he is also "laid out with his back" so he is not caring for her exclusively as he needs to take care of himself as well. We are concerned that the patient is a serious fall risk and would not be safe to send back home without an evaulation. Plan to admit, have the pt be evaluated by PT in the morning, and get her the help she needs. Pt may even benefit from Personal Care Services. Will need a SW consult in the am.  2: UTI: Pt was found to have a UTI in the ED. She was given a dose of Cipro IV in the ED. Will continue treatment with Keflex 500 PO two times a day x 7 days.

## 2010-07-26 NOTE — Progress Notes (Signed)
Summary: triage diarrhea   Phone Note Call from Patient Call back at Home Phone [phone]   Caller: Patient Summary of Call: pt is sick and can't eat - throwing up - has thrush -  pt states she is "going to die" Initial call taken by: De Nurse,  April 11, 2010 1:36 PM  Follow-up for Phone Call        n&v x 1 day. diarrhea started last night. is able to drink some cranberry juice. has diabetes. am cbg was 154. unsure if she has a fever.has not taken any meds yet today. states she is confused. dtr has visited. lives with son.  states she feels so bad "I want to be admitted". I told her to go to ED. states her dtr will take her now. told her to bring her meter with her Follow-up by: Golden Circle RN,  April 11, 2010 2:15 PM

## 2010-07-26 NOTE — Progress Notes (Signed)
Summary: ? about rx   Phone Note Other Incoming Call back at [phone]   Caller: [facility]  Summary of Call: Got the rx for the walker, but needs to know if it is with seat or not.  Script needs to specify.  Fax number [phone] Initial call taken by: Clydell Hakim,  February 04, 2010 2:05 PM  Follow-up for Phone Call        Madaline Guthrie who says the patient must have requested the Rollator walker if we didn't initiate it. She will fax the original prescription back for me to add with the seat.  Follow-up by: Zachery Dauer MD,  February 09, 2010 3:19 PM  Additional Follow-up for Phone Call Additional follow up Details #1::        faxed back with the md writing with seat on it Additional Follow-up by: Golden Circle RN,  February 09, 2010 3:26 PM

## 2010-07-26 NOTE — Miscellaneous (Signed)
Summary: complaints @ legs   Clinical Lists Changes rec'd call from Daralene Milch, a PT with [facility] [phone], reporting that pt is c/o swelling legs & muscles "jumping " in her legs. I call & spoke with pt. offered appt. states she feels fine & does not want an appt now. states she will wait for the one she has on the 14th. told her to call back if she changes her mind. Spoke with PT & told her I tried to get pt in. she states pt's dtrs are worried about their mom.Golden Circle RN  September 01, 2009 10:29 AM

## 2010-07-26 NOTE — Miscellaneous (Signed)
Summary: [facility] order/TS  Per Dr.Hale: ok to have nurse to come to house x 1 more month. Incontinence supplies for pt... called [facility] and spoke with Judeth Cornfield and verbal order given.Arlyss Repress CMA,  September 13, 2009 9:50 AM

## 2010-07-26 NOTE — Assessment & Plan Note (Signed)
Summary: mobility eval,tcb   Vital Signs:  Patient profile:   77 year old female Height:      61 inches Weight:      218 pounds BMI:     41.34 Pulse rate:   69 / minute BP sitting:   120 / 59  (right arm)  Vitals Entered By: Arlyss Repress CMA, (August 17, 2009 9:36 AM) CC: evaluate mobility. Is Patient Diabetic? Yes Pain Assessment Patient in pain? no        Primary Care Provider:  Zachery Dauer MD  CC:  evaluate mobility..  History of Present Illness: Patient here for mobility assessment to be approved for hov-around electric wheelchair.  States she has difficulty walking around when out doing shopping and going out to dinner.  Reports some sob with exertion.  Currently stays home with son, able to ambulate in home to perform ADL's such as from bed to bathroom with no difficulty using rolling walker.  States she has had frequent falls in home with last fall two months ago, falls related to losing balance.  Reports she has ramp at front door, some rails throughout the home, but does not have necessary equipment for showering.  Recently had left knee replacement, reports no current problems related to surgery, denies periods of dizziness, or feeling hypoglycemic during fall occurrences.    Daughter/patient reports cough that is worse during the night and some noted wheeze and sob with exertion.  Patient denies associated chest pain with sob episodes.  Patient reports some hypoglycemic episodes when she has went some time without eating, currently on Glucotrol 5mg  and Metformin 1000mg  in am and one tab in the pm, last HGA1C 5.6 on 05/11/09.  Podiatrist told her to stop Lyrica because of swelling in feet, she tried to stop it but pain increased.  Her daughter recommended [place] switching her to gabapentin.  Habits & Providers  Alcohol-Tobacco-Diet     Tobacco Status: never     Passive Smoke Exposure: yes  Current Medications (verified): 1)  Bayer Aspirin 325 Mg Tabs (Aspirin) ....  Take 1 Tablet By Mouth Once A Day 2)  Furosemide 40 Mg Tabs (Furosemide) .... Take 1 Tablet By Mouth Once A Day 3)  Glucotrol 5 Mg  Tabs (Glipizide) .... One-Half Daily 4)  Simvastatin 20 Mg Tabs (Simvastatin) .... Take 1 Tablet By Mouth At Bedtime 5)  Toprol Xl 50 Mg  Tb24 (Metoprolol Succinate) .... One Daily 6)  Colchicine 0.6 Mg  Tabs (Colchicine) .... Two Times A Day As Needed For Gouty Attacks 7)  Celexa 40 Mg Tabs (Citalopram Hydrobromide) .... One Daily 8)  Allopurinol 100 Mg Tabs (Allopurinol) .... Take 2 Tabs Daily 9)  Omeprazole 20 Mg  Tbec (Omeprazole) .... One Daily 10)  Metformin Hcl 500 Mg  Tabs (Metformin Hcl) .... Take 2 Tabs in Am and One Tab in P.m. 11)  Tramadol Hcl 50 Mg  Tabs (Tramadol Hcl) .... Take One or Two Tablets Every 6 Hours As Needed 12)  Vitamin B-12 Cr 1500 Mcg  Tbcr (Cyanocobalamin) .... Monthly 13)  Calcium 500/d 500-200 Mg-Unit Tabs (Calcium Carbonate-Vitamin D) .... One By Mouth Three Times A Day 14)  Nystatin 100000 Unit/gm Powd (Nystatin) 15)  Requip 2 Mg Tabs (Ropinirole Hcl) .... Take 1 - 2 Tabs At Bedtime As Needed 16)  Fish Oil 1000 Mg Caps (Omega-3 Fatty Acids) .... Ran Out 17)  Flonase 50 Mcg/act Susp (Fluticasone Propionate) .... Two Squirts in Each Nostril Daily 18)  Guiatuss Ac 100-10  Mg/18ml Syrp (Guaifenesin-Codeine) .... 2 Teaspoonsful Three Times A Day As Needed For Cough, 150 Cc 19)  Lorazepam 0.5 Mg Tabs (Lorazepam) .... One Two Times A Day As Needed For Anxiety 20)  Doxycycline Hyclate 100 Mg Tabs (Doxycycline Hyclate) .... Two Times A Day For 7 Dats 21)  Neurontin 300 Mg Caps (Gabapentin) .... One Daily For One Week, Then One Two Times A Day For One Week, One Three Times A Day  Allergies (verified): 1)  ! Enalapril Maleate (Enalapril Maleate) 2)  Remeron (Mirtazapine)  Past History:  Past Surgical History: urethrocystocopy normal - 09/11/2005 left eye cataract replacement with intraoccular lens. knee replacement   Social  History: Passive Smoke Exposure:  yes  Review of Systems General:  Denies chills, fatigue, malaise, weakness, and weight loss. CV:  Complains of shortness of breath with exertion and swelling of feet; denies chest pain or discomfort, difficulty breathing while lying down, fainting, fatigue, lightheadness, near fainting, palpitations, swelling of hands, and weight gain. Resp:  Complains of cough, shortness of breath, sputum productive, and wheezing; denies chest discomfort, chest pain with inspiration, coughing up blood, and pleuritic. MS:  Complains of loss of strength, muscle weakness, and stiffness; denies joint pain, joint redness, joint swelling, and muscle aches. Derm:  Denies changes in color of skin, changes in nail beds, and flushing. Neuro:  Complains of disturbances in coordination, falling down, and poor balance; denies brief paralysis, inability to speak, numbness, and tingling.  Physical Exam  General:  In no acute distress; alert,appropriate and cooperative throughout examination Lungs:  Normal respiratory effort, chest expands symmetrically. Lungs are clear to auscultation except fine crackles at bilateral bases. Heart:  Normal rate and regular rhythm. S1 and S2 normal without gallop, murmur, click, rub or other extra sounds. Pulses:  R and L carotid,radial,femoral,dorsalis pedis and posterior tibial pulses are full and equal bilaterally Extremities:  1+ pedal and pretibial edema bilaterally Neurologic:  alert & oriented X3, decreased strength in bilateral lower extremities, sentation intact, ambulatory with rolling walker, able to sit to stand without difficulty. Skin:  Intact without suspicious lesions or rashes Psych:  Cognition and judgment appear intact. Alert and cooperative with normal attention span and concentration. No apparent delusions, illusions, hallucinations   Impression & Recommendations:  Problem # 1:  ACCIDENTAL FALLS, RECURRENT (ICD-E888.9) Patient reports  of frequent falls due to loss of balance, will need home assessment for safety and physical therapy for leg strengthening.  Will benefit from durable medical equipment to assist with ADL's.  Home health referral to assess.  Follow up with Primary care doctor in one month. Orders: Home Health Referral (Home Health) [facility]- Est  Level 4 (09811)  Problem # 2:  COUGH (ICD-786.2) Subjective complaints of cough that worsens at night with fine crackles auscultated in bases and bilateral 1+ edema, will obtain cxray for evaluation.  Problem # 3:  DIABETES MELLITUS, TYPE II, CONTROLLED, W/NEURO COMPS (ICD-250.60) Subjective hypoglycemic episodes, last HGA1C 05/11/09 was 5.6, decrease Glucotrol to 2.5mg  daily, then re-eval.  Patient educated on how hypoglycemia and preventative measures. Her updated medication list for this problem includes:    Bayer Aspirin 325 Mg Tabs (Aspirin) .Marland Kitchen... Take 1 tablet by mouth once a day    Glucotrol 5 Mg Tabs (Glipizide) ..... One-half daily    Metformin Hcl 500 Mg Tabs (Metformin hcl) .Marland Kitchen... Take 2 tabs in am and one tab in p.m.  Orders: A1C-[facility] (91478) [facility]- Est  Level 4 (29562)  Complete Medication List: 1)  Bayer  Aspirin 325 Mg Tabs (Aspirin) .... Take 1 tablet by mouth once a day 2)  Furosemide 40 Mg Tabs (Furosemide) .... Take 1 tablet by mouth once a day 3)  Glucotrol 5 Mg Tabs (Glipizide) .... One-half daily 4)  Simvastatin 20 Mg Tabs (Simvastatin) .... Take 1 tablet by mouth at bedtime 5)  Toprol Xl 50 Mg Tb24 (Metoprolol succinate) .... One daily 6)  Colchicine 0.6 Mg Tabs (Colchicine) .... Two times a day as needed for gouty attacks 7)  Celexa 40 Mg Tabs (Citalopram hydrobromide) .... One daily 8)  Allopurinol 100 Mg Tabs (Allopurinol) .... Take 2 tabs daily 9)  Omeprazole 20 Mg Tbec (Omeprazole) .... One daily 10)  Metformin Hcl 500 Mg Tabs (Metformin hcl) .... Take 2 tabs in am and one tab in p.m. 11)  Tramadol Hcl 50 Mg Tabs (Tramadol hcl) .... Take one or  two tablets every 6 hours as needed 12)  Vitamin B-12 Cr 1500 Mcg Tbcr (Cyanocobalamin) .... Monthly 13)  Calcium 500/d 500-200 Mg-unit Tabs (Calcium carbonate-vitamin d) .... One by mouth three times a day 14)  Nystatin 100000 Unit/gm Powd (Nystatin) 15)  Requip 2 Mg Tabs (Ropinirole hcl) .... Take 1 - 2 tabs at bedtime as needed 16)  Fish Oil 1000 Mg Caps (Omega-3 fatty acids) .... Ran out 17)  Flonase 50 Mcg/act Susp (Fluticasone propionate) .... Two squirts in each nostril daily 18)  Guiatuss Ac 100-10 Mg/58ml Syrp (Guaifenesin-codeine) .... 2 teaspoonsful three times a day as needed for cough, 150 cc 19)  Lorazepam 0.5 Mg Tabs (Lorazepam) .... One two times a day as needed for anxiety 20)  Doxycycline Hyclate 100 Mg Tabs (Doxycycline hyclate) .... Two times a day for 7 dats 21)  Neurontin 300 Mg Caps (Gabapentin) .... One daily for one week, then one two times a day for one week, one three times a day  Other Orders: Radiology other (Radiology Other) Vit B12 1000 mcg (A5409)  Patient Instructions: 1)  Cut glipizie in 1/2-take daily in morning, do not go long periods without eating. 2)  Home health to come in for therapy, help with balance and falls, and strenthen legs and equipment needs 3)  Please schedule a follow-up appointment in 1 month in Geriatric clinic.  Prescriptions: NEURONTIN 300 MG CAPS (GABAPENTIN) one daily for one week, then one two times a day for one week, one three times a day  #90 x 3   Entered and Authorized by:   Luretha Murphy NP   Signed by:   Arlyss Repress CMA, on 08/17/2009   Method used:   Electronically to        [facility] [id number] (retail)       [address] [place], [place]  [address]       Ph: [phone]       Fax: [phone]   RxID:   [id number]   Laboratory Results   Blood Tests   Date/Time Received: August 17, 2009  9:46 AM  Date/Time Reported: August 17, 2009 10:00 AM \\par   HGBA1C: 6.9%    (Normal Range: Non-Diabetic - 3-6%   Control Diabetic - 6-8%)  Comments: ...........test performed by..........Marland Kitchen [place], SMA      Medication Administration  Injection # 1:    Medication: Vit B12 1000 mcg    Diagnosis: ANEMIA, PERNICIOUS (ICD-281.0)    Route: IM    Site: L deltoid  Exp Date: 04/27/2011    Lot #: [id number]    Mfr: [employer] Regent    Patient tolerated injection without complications    Given by: Arlyss Repress CMA, (August 17, 2009 10:40 AM)  Orders Added: 1)  A1C-[facility] [83036] 2)  Radiology other [Radiology Other] 3)  Home Health Referral [facility]] 4)  Vit B12 1000 mcg [J3420] 5)  [facility]- Est  Level 4 [32355]

## 2010-07-26 NOTE — Progress Notes (Signed)
Summary: Rx Prob   Phone Note Call from Patient Call back at Home Phone [phone]   Caller: Patient Summary of Call: Wants to up her Ropinirole. Initial call taken by: Clydell Hakim,  December 08, 2009 3:29 PM  Follow-up for Phone Call        spoke with patient and she reports for past 2 weeks Requip has not been controlling her discomfort in legs and jitteriness. it wears off too soon. this is why she would like to have the dose increased. will forward to MD. Follow-up by: Theresia Lo RN,  December 08, 2009 3:37 PM  Additional Follow-up for Phone Call Additional follow up Details #1::        I was expecting to discuss this with her on her next office visit which she cancelled.  Additional Follow-up by: Zachery Dauer MD,  December 29, 2009 5:36 PM

## 2010-07-26 NOTE — Letter (Signed)
Summary: Appointment - Reminder 2  [facility], Main Office  [address]   [place], [place] [address]   Phone: [phone]  Fax: [phone]     July 01, 2009 MRN: [id number]   [name] [name] [address]  [place], [place]  [address]   Dear [name],  Our records indicate that it is time to schedule a follow-up appointment with Dr. Eden Emms. It is very important that we reach you to schedule this appointment. We look forward to participating in your health care needs. Please contact us at the number listed above at your earliest convenience to schedule your appointment.  If you are unable to make an appointment at this time, give [place] a call so we can update our records.  Sincerely,   Migdalia Dk [facility] Scheduling Team

## 2010-07-26 NOTE — Miscellaneous (Signed)
Summary: [facility]  called pt. will send order to [facility]. fax [phone]. spoke with [facility]. pt has received services from them.Arlyss Repress CMA,  April 01, 2010 4:20 PM  Clinical Lists Changes  Orders: Added new Referral order of Physical Therapy Referral (PT) - Signed Added new Referral order of Occupational Therapy (OT) - Signed

## 2010-07-26 NOTE — Progress Notes (Signed)
Summary: phone rx  Thrush  Medications Added NYSTATIN 100000 UNIT/ML SUSP (NYSTATIN) Swish and swallow 600,000 units 4 times a day.  Use for 7 days after you get better to make sure we completely treat the thrush       Phone Note Call from Patient Call back at [phone]   Caller: [facility] Summary of Call: Thinks pt has thrush and thinks she needs something called in for this.  Pharmacy [facility]. Initial call taken by: Clydell Hakim,  April 08, 2010 2:54 PM  Follow-up for Phone Call        nurse says the thrush in her mouth is bad & pt is having trouble eating. asked that something be called in so pt will be more comfortable over the weekend. told her pcp is not here but will ask another md Follow-up by: Golden Circle RN,  April 08, 2010 3:04 PM  Additional Follow-up for Phone Call Additional follow up Details #1::        Nystatin suspension sent electronically to [facility] on [address].  Could you verify that this is the correct pharmacy?  Thanks! Additional Follow-up by: Sarah Swaz[place]and MD,  April 08, 2010 3:34 PM    New/Updated Medications: NYSTATIN 100000 UNIT/ML SUSP (NYSTATIN) Swish and swallow 600,000 units 4 times a day.  Use for 7 days after you get better to make sure we completely treat the thrush Prescriptions: NYSTATIN 100000 UNIT/ML SUSP (NYSTATIN) Swish and swallow 600,000 units 4 times a day.  Use for 7 days after you get better to make sure we completely treat the thrush  #1 bottle x 0   Entered and Authorized by:   Sarah Swaziland MD   Signed by:   Sarah Swaziland MD on 04/08/2010   Method used:   Electronically to        [facility] [id number] (retail)       [address]       [place], [place]  [address]       Ph: [phone]       Fax: [phone]   RxID:   [id number]  called & told rn that med has been sent to correct pharmacy. she is with pt now & will telll her to get it & reinforce how to use  it.Golden Circle RN  April 08, 2010 3:39 PM  Appended Document: phone rx  Ginette Pitman Medications Added NYSTATIN 100000 UNIT/GM POWD (NYSTATIN) Swish and swallow 4 times a day.  Use for 7 days after you get better to make sure we completely treat the thrush. Dispo: QS       Called from pt saying that the prescription didn't go through.  Will resend it.   Clinical Lists Changes  Medications: Changed medication from NYSTATIN 100000 UNIT/GM POWD (NYSTATIN) to NYSTATIN 100000 UNIT/GM POWD (NYSTATIN) Swish and swallow 4 times a day.  Use for 7 days after you get better to make sure we completely treat the thrush. Dispo: QS - Signed Rx of NYSTATIN 100000 UNIT/GM POWD (NYSTATIN) Swish and swallow 4 times a day.  Use for 7 days after you get better to make sure we completely treat the thrush. Dispo: QS;  #1 x 0;  Signed;  Entered by: Angelena Sole MD;  Authorized by: Angelena Sole MD;  Method used: Electronically to [facility] [id number], [place], [place], [place]  [address], Ph:  [phone], Fax: [phone]    Prescriptions: NYSTATIN 100000 UNIT/GM POWD (NYSTATIN) Swish and swallow 4 times a day.  Use for 7 days after you get better to make sure we completely treat the thrush. Dispo: QS  #1 x 0   Entered and Authorized by:   Angelena Sole MD   Signed by:   Angelena Sole MD on 04/09/2010   Method used:   Electronically to        [facility] [id number] (retail)       [address] [place], [place]  [address]       Ph: [phone]       Fax: [phone]   RxID:   [id number]

## 2010-07-26 NOTE — Initial Assessments (Signed)
Summary: Hospital Admission H & P     dict# [id number]   Primary Care Provider:  Zachery Dauer MD  CC:  weakness, vomiting, and diarrhea.  History of Present Illness: 77 y/o F who was recently hospitalized 2010-04-08 for weakness comes in tonight with diarrhea since last night and vomiting for the last few days. She is having some chills and heatwaves. She has not been taking any by mouth meds of food the last 2 days. Her CBG's have been randing 129-154. She says she has not been able to tolerate much food since being discharged from the hospital. She lives with her son and has 2 daughter that look in on her from time to time. She was offered an evaluation for possible rehab placement during last admission and declined. Family is considering it during this admission. She was treated for a UTI during the last admission and started having diarrhea a few days later. No blood seen in stool, no blood seen in emesis. No abdominal pain.   Allergies: 1)  ! Enalapril Maleate (Enalapril Maleate) 2)  Remeron (Mirtazapine)  Past History:  Past Surgical History: Last updated: 08/17/2009 urethrocystocopy normal - 09/11/2005 left eye cataract replacement with intraoccular lens. knee replacement   Family History: Last updated: 06/18/2007 Alc - daughter and son DM - M, daughter, sister, heart valve problem  sister, MI - M age 15 Bro age 28 Sis died of heart problem Jun 05, 2023, had Alzheimers.  Social History: Last updated: 08-13-2008 Sister, Johnny Bridge, died 8 widowed, 1996-04-08 Son lives with her Has custody of Riki Rusk and Herbert Seta, their  Mother is Tilda Franco died in her home of a OD on Feb 27, 2023 Tobacco Use - No.  Alcohol Use - no  Past Medical History:  1. Type 2 diabetes.   2. Hypertension.   3. Hyperlipidemia.   4. History of urinary frequency and incontinence.   5. Osteoarthritis.   6. Obesity.   7. Insomnia.   8. Depressive disorder.   9. Gastroesophageal reflux disease.  10.Sleep apnea.    11. PVD     TSH nl - 09/24/2001 Hematuria, neg culture 2/07  Review of Systems        vitals reviewed and pertinent negatives and positives seen in HPI   Physical Exam  General:  Well-developed,well-nourished,in no acute distress; alert,appropriate and cooperative throughout examination Head:  Normocephalic and atraumatic without obvious abnormalities. No apparent alopecia or balding. Eyes:  No corneal or conjunctival inflammation noted. EOMI. Perrla. Funduscopic exam benign, without hemorrhages, exudates or papilledema. Vision grossly normal. Ears:  External ear exam shows no significant lesions or deformities.  Otoscopic examination reveals clear left canal but Rt canal still has some bloody appearing cerumen similar to last admission, tympanic membranes are intact without bulging, retraction, inflammation or discharge. Hearing is grossly normal bilaterally. Nose:  External nasal examination shows no deformity or inflammation. Nasal mucosa are pink and moist without lesions or exudates. Mouth:  oropharynx without lesions or exudates.  dentures,  lips are dry with crusted blood on them  Neck:  No deformities, masses, or tenderness noted. Lungs:  Normal respiratory effort, chest expands symmetrically. Lungs are clear to auscultation, no crackles or wheezes. Heart:  Normal rate and regular rhythm. S1 and S2 normal without gallop, murmur, click, rub or other extra sounds. Abdomen:  Bowel sounds positive,abdomen soft and non-tender without masses, organomegaly or hernias noted. Msk:  No deformity or scoliosis noted of thoracic or lumbar spine.   Pulses:  R and L radial,dorsalis pedis  and posterior tibial pulses are full and equal bilaterally Extremities:  No clubbing, cyanosis, edema, or deformity noted with normal full range of motion of all joints.   Neurologic:  No cranial nerve deficits noted. Sensory, motor and coordinative functions appear intact. Skin:  Intact without suspicious  lesions or rashes, slight abrasion on Rt knee and Rt lateral leg: healing Psych:  Cognition and judgment appear intact. Alert and cooperative with normal attention span and concentration. No apparent delusions, illusions, hallucinations   Complete Medication List: 1)  Bayer Aspirin 325 Mg Tabs (Aspirin) .... Take 1 tablet by mouth once a day 2)  Furosemide 40 Mg Tabs (Furosemide) .... Take 1 tablet by mouth once a day 3)  Glucotrol 5 Mg Tabs (Glipizide) .... One-half daily 4)  Simvastatin 20 Mg Tabs (Simvastatin) .... Take 1 tablet by mouth at bedtime 5)  Toprol Xl 50 Mg Tb24 (Metoprolol succinate) .... One daily 6)  Colchicine 0.6 Mg Tabs (Colchicine) .... Two times a day as needed for gouty attacks 7)  Celexa 40 Mg Tabs (Citalopram hydrobromide) .... One daily 8)  Allopurinol 100 Mg Tabs (Allopurinol) .... Take 2 tabs daily 9)  Omeprazole 20 Mg Tbec (Omeprazole) .... One daily 10)  Metformin Hcl 500 Mg Tabs (Metformin hcl) .... Take 2 tabs in am and one tab in p.m. 11)  Tramadol Hcl 50 Mg Tabs (Tramadol hcl) .... Take one or two tablets every 6 hours as needed 12)  Vitamin B-12 Cr 1500 Mcg Tbcr (Cyanocobalamin) .... Monthly 13)  Calcium 500/d 500-200 Mg-unit Tabs (Calcium carbonate-vitamin d) .... One by mouth three times a day 14)  Nystatin 100000 Unit/gm Powd (Nystatin) .... Swish and swallow 4 times a day.  use for 7 days after you get better to make sure we completely treat the thrush. dispo: qs 15)  Fish Oil 1000 Mg Caps (Omega-3 fatty acids) .... Ran out 16)  Fluticasone Propionate 50 Mcg/act Susp (Fluticasone propionate) .... Two sprays each nostil once daily 17)  Lorazepam 0.5 Mg Tabs (Lorazepam) .... One two times a day as needed for anxiety 18)  Diclofenac Sodium 75 Mg Tbec (Diclofenac sodium) .... Take one tab two times a day 19)  Hydrocortisone 2.5 % Crea (Hydrocortisone) .... Apply bid 20)  Oxybutynin Chloride 5 Mg Xr24h-tab (Oxybutynin chloride) .... Take one tablet daily 21)   Oxycodone Hcl 5 Mg Tabs (Oxycodone hcl) .... Take one to two tabs every 4 hour as needed pain 22)  Calcitonin (salmon) 200 Unit/act Soln (Calcitonin (salmon)) .... One spray daily in alternating nostrils 23)  Lyrica 150 Mg Caps (Pregabalin) .... Once daily 24)  Ergocalciferol 50000 Unit Caps (Ergocalciferol) .... Take one tablet weeekly for 8 weeks 25)  Alendronate Sodium 70 Mg Tabs (Alendronate sodium) .... One tablet weekly 26)  Oxycontin 20 Mg Xr12h-tab (Oxycodone hcl) .... Take one tablet twice a day for pain 27)  Senokot 8.6 Mg Tabs (Sennosides) .... Oone to two tabs daily for constipation 28)  Nystatin 100000 Unit/ml Susp (Nystatin) .... Swish and swallow 600,000 units 4 times a day.  use for 7 days after you get better to make sure we completely treat the thrush      Beta Natriuretic Peptide                 195.0   Color, Urine                          YELLOW  YELLOW  Appearance                            CLEAR             CLEAR  Specific Gravity                         1.018             1.005-1.030  pH                                              6.0               5.0-8.0  Urine Glucose                       NEGATIVE          NEG              mg/dL  Bilirubin                                  SMALL      a      NEG  Ketones                                     >80        a      NEG              mg/dL  Blood                                    NEGATIVE          NEG  Protein                                  NEGATIVE          NEG              mg/dL  Urobilinogen                                 1.0               0.0-1.0          mg/dL  Nitrite                                    NEGATIVE          NEG  Leukocytes                            NEGATIVE          NEG  Acetone, Blood                           SMALL  a      NEG   Lipase                                   17     Sodium (NA)                              136               135-145          mEq/L  Potassium (K)                            4.1               3.5-5.1          mEq/L  Chloride                                   100               96-112           mEq/L  CO2                                         26                19-32            mEq/L  Glucose                                   135        h      70-99            mg/dL  BUN                                          15                6-23             mg/dL  Creatinine                                  1.09              0.4-1.2          mg/dL  GFR, Est Non African American            49         l      >60              mL/min  GFR, Est African American                59         l      >60              mL/min    Oversized comment, see footnote  1  Bilirubin, Total                                0.7               0.3-1.2          mg/dL Alkaline Phosphatase                     145        h      39-117           U/L  SGOT (AST)                                  20                0-37             U/L  SGPT (ALT)                                  13                0-35             U/L  Total  Protein                                6.4               6.0-8.3          g/dL  Albumin-Blood                              3.0        l      3.5-5.2          g/dL  Calcium                                       8.0        l      8.4-10.5         mg/dL  WBC                                           6.8               4.0-10.5         K/uL  RBC                                           3.58       l      3.87-5.11        MIL/uL  Hemoglobin (HGB)                       10.9       l      12.0-15.0  g/dL  Hematocrit (HCT)                         32.9       l      36.0-46.0        %  MCV                                          91.9              78.0-100.0       fL  MCH -                                        30.4              26.0-34.0        pg  MCHC                                        33.1              30.0-36.0        g/dL  RDW                                          14.5              11.5-15.5        %  Platelet Count (PLT)                    213               150-400          K/uL  Neutrophils, %                             85         h      43-77            %  Lymphocytes, %                          11         l      12-46            %  Monocytes, %                               3                 3-12             %  Eosinophils, %                              1                 0-5              %  Basophils, %                                 0                 0-1              %   CXR:    1.  Low lung volumes, mild pulmonary edema and bilateral pleural   effusions.  The base consolidation which could relate to   atelectasis, infection or aspiration.   2.  Questionable lucency under the right hemidiaphragm is favored   to be related to atelectasis rather than true pneumoperitoneum.   However, dedicated imaging of the abdomen including decubitus view   (left lateral decubitus,right side up) or abdominal CT is   recommended.  CT abd/pelvis:    1.  Limited evaluation without IV contrast.  However, no acute   findings are seen.   2.  Osteopenia and sacral insufficiency fractures again noted.  A/P: 77 y/o F with weakness s/o vomiting and diarrhea 1: Vomiting/Diarrhea: Pt appears to have GI upset related to antibiotic. This could be because of c.diff caused by the antibiotic so I will get C. diff PCR testing. Plan to also get gastrocult and hemocult testing if she continues to vomit and have diarrhea. Pt is dehydrated because of the fluid loss. Plan to have IVF going at 150 cc/hr (no faster because her BNP is 195 so I don't want to fluid overload her). Pt is currently NPO except sips and chips as tolerated. Will change all meds to IV. Will add Zofran to her IV meds. Will hold off on Heparin until I know there is not GI bleeding. Will use SCD's until then.  2: hypertension: Pt has a h/o hypertension and since she is not taking by mouth meds plan to treat with  Hydralazine IV.  3: Pain: Pt has pain from recent sacral fractures. Will cont Calcitonin spray, will treat with Toradol and Morphine for pain.  4: GERD: Plan to continue Protonix IV since the patient is also having some GI upset and has a h/o GERD.  5: FEN/GI: Plan to cont IVF of NS at 150 cc/hr. Reassess in the am. Will plan to get BMET and CBC in the a.m. and keep pt NPO until am. May advance diet as tolerated in the morning. Currently on SSI since she is not taking by mouth. 6: Prophy: SCD's for DVT prophylaxis 7: Dispo: I hope that this patient will consider a rehab/SNF in order to get some extra help in the days following her hospitalization. Will discuss with her PCP.

## 2010-07-26 NOTE — Progress Notes (Signed)
Summary: medication question   Phone Note Call from Patient   Caller: Patient Call For: [phone] Summary of Call: Please call patient with appt for MRI per Dr. Martin Majestic note.  And patient want refill on her Percocet. Initial call taken by: Abundio Miu,  March 22, 2010 2:34 PM  Follow-up for Phone Call        appt scheduled at [facility] imaging, [address] location saturday 03/26/10 at 4:15pm. pt notified of appt.  Follow-up by: Tessie Fass CMA,  March 22, 2010 3:08 PM  Additional Follow-up for Phone Call Additional follow up Details #1::        [name] [name] is requesting Rx for percocet, says she has taken this before and the current med for pain makes her feel sick. Additional Follow-up by: Tessie Fass CMA,  March 22, 2010 3:09 PM    Additional Follow-up for Phone Call Additional follow up Details #2::    Informed her that the prescription was for the generic of Percocet. Asked her to bring all of her pills to the visit next week in [facility]. We'll discuss her iron, Vitamin D, and MRI results then and try to reduce polypharmacy.  Follow-up by: Zachery Dauer MD,  March 23, 2010 3:42 PM

## 2010-07-26 NOTE — Miscellaneous (Signed)
Summary: Orders Update   Clinical Lists Changes  Problems: Added new problem of ENCOUNTER FOR LONG-TERM USE OF OTHER MEDICATIONS (ICD-V58.69) - Signed Orders: Added new Test order of B12-[facility] [phone]) - Signed Added new Test order of CBC-[facility] (40102) - Signed   Ok per Dr. Sheffield Slider

## 2010-07-26 NOTE — Letter (Signed)
Summary: Generic Letter  [facility]  [address]   [place], [place] [address]   Phone: [phone]  Fax: [phone]    09/23/2009  Regarding:Marc Houchin           [address]  [address]           [place], [place]  [address]  Lonn [place],  This letter is to confirm that Mrs. Dauphinee is 77 years old and has physical limitations to permanently excuse her from jury duty.  Please contact our office for any questions regarding this document.    Sincerely,   Luretha Murphy NP/Wayne A. Sheffield Slider MD

## 2010-07-26 NOTE — Miscellaneous (Signed)
Summary: Diabetic Shoe Form  Patients daughter dropped off papers to be filled out for diabetic shoes.  Please fax when completed. Bradly Bienenstock  February 02, 2010 3:02 PM to pcp.Golden Circle RN  February 02, 2010 3:04 PM  Diabetic shoe form completed and prescription written for Rollator walker and given to Kennon Rounds  Appended Document: Diabetic Shoe Form faxed to [facility]

## 2010-07-26 NOTE — Letter (Signed)
Summary: Probation Letter  [facility]  [address]   [place], [place] [address]   Phone: [phone]  Fax: [phone]    12/29/2009  [name] [name] [address]  [place], [place]  [address]  Dear [name],  With the goal of better serving all our patients the [facility] is following each patient's missed appointments.  You have missed at least 3 appointments with our practice.If you cannot keep your appointment, we expect you to call at least 24 hours before your appointment time.  Missing appointments prevents other patients from seeing [place] and makes it difficult to provide you with the best possible medical care.      1.   If you miss one more appointment, we will only give you limited medical services. This means we will not call in medication refills, complete a form, or make a referral for you except when you are here for a scheduled office visit.    2.   If you miss 2 or more appointments in the next year, we will dismiss you from our practice.    Our office staff can be reached at [phone] Monday through Friday from 8:30 a.m.-5:00 p.m. and will be glad to schedule your appointment as necessary.    Thank you.   [facility]  Appended Document: Probation Letter mailed

## 2010-07-26 NOTE — Progress Notes (Signed)
Summary: Rx Oxycontin   Phone Note Refill Request Call back at Home Phone [phone]   Refills Requested: Medication #1:  OXYCONTIN 20 MG XR12H-TAB take one tablet twice a day for pain Initial call taken by: Knox Royalty,  May 30, 2010 11:20 AM  Follow-up for Phone Call        Printed, signed and in [employer] box Follow-up by: Zachery Dauer MD,  May 31, 2010 12:35 PM

## 2010-07-26 NOTE — Miscellaneous (Signed)
Summary: dm shoes   Clinical Lists Changes called to make her an appt to discuss diabetic shoes. states she has an appt tomorrow. forms are in md chart box. when she comes-plz print & give her the probation letter that was mailed yesterday & document it.Golden Circle RN  December 30, 2009 10:01 AM  Patient will need to be seen to discuss this request, canceled today Luretha Murphy NP  December 31, 2009 10:41 AM

## 2010-07-26 NOTE — Miscellaneous (Signed)
Summary: Form 485 reconciliation  Medications Added DICLOFENAC SODIUM 75 MG TBEC (DICLOFENAC SODIUM) Take one tab two times a day HYDROCORTISONE 2.5 % CREA (HYDROCORTISONE) apply bid      Fro her orthopedist?  Clinical Lists Changes  Medications: Removed medication of DOXYCYCLINE HYCLATE 100 MG TABS (DOXYCYCLINE HYCLATE) two times a day for 7 dats Added new medication of DICLOFENAC SODIUM 75 MG TBEC (DICLOFENAC SODIUM) Take one tab two times a day Added new medication of HYDROCORTISONE 2.5 % CREA (HYDROCORTISONE) apply bid Orders: Added new Test order of Miscellaneous Lab Charge-[facility] [id number]) - Signed

## 2010-07-26 NOTE — Assessment & Plan Note (Signed)
Summary: B12 INJECTION PER [facility]   Nurse Visit   Allergies: 1)  ! Enalapril Maleate (Enalapril Maleate) 2)  Remeron (Mirtazapine)  Medication Administration  Injection # 1:    Medication: Vit B12 1000 mcg    Diagnosis: ANEMIA, PERNICIOUS (ICD-281.0)    Route: IM    Site: R deltoid    Exp Date: 03/2011    Lot #: [id number]    Mfr: [employer] Regent    Patient tolerated injection without complications    Given by: Theresia Lo RN (July 19, 2009 11:27 AM)  Orders Added: 1)  Vit B12 1000 mcg [J3420] 2)  Admin of Injection (IM/SQ) [60454]   Medication Administration  Injection # 1:    Medication: Vit B12 1000 mcg    Diagnosis: ANEMIA, PERNICIOUS (ICD-281.0)    Route: IM    Site: R deltoid    Exp Date: 03/2011    Lot #: [id number]    Mfr: [employer] Regent    Patient tolerated injection without complications    Given by: Theresia Lo RN (July 19, 2009 11:27 AM)  Orders Added: 1)  Vit B12 1000 mcg [J3420] 2)  Admin of Injection (IM/SQ) [09811]

## 2010-07-26 NOTE — Assessment & Plan Note (Signed)
Summary: f/up,tcb   Vital Signs:  Patient profile:   77 year old female Height:      61 inches Weight:      216.8 pounds BMI:     41.11 Temp:     98.5 degrees F oral Pulse rate:   79 / minute BP sitting:   131 / 72  (right arm) Cuff size:   regular  Vitals Entered By: Garen Grams LPN (January 04, 2010 10:12 AM) CC: f/u dm Is Patient Diabetic? Yes Did you bring your meter with you today? No   Primary Care Provider:  Zachery Dauer MD  CC:  f/u dm.  History of Present Illness: Reports no recent falls, feel weak in her legs and legs want to give out.  Not keeping up with exercises taught by PT after knee replacement.  Requesting a walker that she can push and sit on to rest, diabetic shoes, and a lift chair.  Discussed the problmes inherent in lift chairs and the liklihood that it would make her weaker.  Agreed to complete forms for walker and shoes.  She admits that the supply companies send her mail and she calls them.  They in turn come to her home to assess needs and promote products.  Restless legs respond to Requip hs.  Uses tramadol for aches and pains.  Picks at her skin on her arms.  Allergies: 1)  ! Enalapril Maleate (Enalapril Maleate) 2)  Remeron (Mirtazapine)  Review of Systems      See HPI General:  Denies chills, fever, loss of appetite, sleep disorder, and sweats.  Physical Exam  General:  Usual appearing, sitting on the seat of a rolling walker that is her sons' Lungs:  normal respiratory effort, normal breath sounds, and no crackles.   Heart:  normal rate, regular rhythm, and no murmur.   Skin:  multiple picking lesions on UE in various stages Psych:  flat affect.    Diabetes Management Exam:    Foot Exam (with socks and/or shoes not present):       Sensory-Pinprick/Light touch:          Left medial foot (L-4): normal          Left dorsal foot (L-5): normal          Left lateral foot (S-1): normal          Right medial foot (L-4): normal          Right  dorsal foot (L-5): normal          Right lateral foot (S-1): normal       Sensory-Monofilament:          Left foot: normal          Right foot: normal       Inspection:          Left foot: abnormal             Comments: bunions, high instep, rocker bottom feet          Right foot: abnormal             Comments: bunions, high instep, rocker bottom feet       Nails:          Left foot: thickened          Right foot: thickened   Impression & Recommendations:  Problem # 1:  GAIT DISTURBANCE (ICD-781.2) would benefit from rolling walker with a seat. Orders: [facility]- Est  Level 4 (16109)  Problem # 2:  DIABETES MELLITUS, TYPE II, CONTROLLED, W/NEURO COMPS (ICD-250.60) good control A!1C at 7.1% Her updated medication list for this problem includes:    Bayer Aspirin 325 Mg Tabs (Aspirin) .Marland Kitchen... Take 1 tablet by mouth once a day    Glucotrol 5 Mg Tabs (Glipizide) ..... One-half daily    Metformin Hcl 500 Mg Tabs (Metformin hcl) .Marland Kitchen... Take 2 tabs in am and one tab in p.m.  Orders: A1C-[facility] (16109) Basic Met-[facility] (60454-09811) [facility]- Est  Level 4 (91478)  Problem # 3:  OSTEOARTHRITIS, LOWER LEG (ICD-715.96) reinforced exercises to keep legs muscles engaged, would not benefit from lift chair. Her updated medication list for this problem includes:    Bayer Aspirin 325 Mg Tabs (Aspirin) .Marland Kitchen... Take 1 tablet by mouth once a day    Tramadol Hcl 50 Mg Tabs (Tramadol hcl) .Marland Kitchen... Take one or two tablets every 6 hours as needed    Diclofenac Sodium 75 Mg Tbec (Diclofenac sodium) .Marland Kitchen... Take one tab two times a day  Orders: [facility]- Est  Level 4 (29562)  Problem # 4:  RESTLESS LEGS SYNDROME (ICD-333.94) continue Requip Orders: [facility]- Est  Level 4 (13086)  Problem # 5:  ANEMIA, PERNICIOUS (ICD-281.0)  Her updated medication list for this problem includes:    Vitamin B-12 Cr 1500 Mcg Tbcr (Cyanocobalamin) ..... Monthly  Orders: Vit B12 1000 mcg (J3420)  Complete Medication List: 1)  Bayer Aspirin  325 Mg Tabs (Aspirin) .... Take 1 tablet by mouth once a day 2)  Furosemide 40 Mg Tabs (Furosemide) .... Take 1 tablet by mouth once a day 3)  Glucotrol 5 Mg Tabs (Glipizide) .... One-half daily 4)  Simvastatin 20 Mg Tabs (Simvastatin) .... Take 1 tablet by mouth at bedtime 5)  Toprol Xl 50 Mg Tb24 (Metoprolol succinate) .... One daily 6)  Colchicine 0.6 Mg Tabs (Colchicine) .... Two times a day as needed for gouty attacks 7)  Celexa 40 Mg Tabs (Citalopram hydrobromide) .... One daily 8)  Allopurinol 100 Mg Tabs (Allopurinol) .... Take 2 tabs daily 9)  Omeprazole 20 Mg Tbec (Omeprazole) .... One daily 10)  Metformin Hcl 500 Mg Tabs (Metformin hcl) .... Take 2 tabs in am and one tab in p.m. 11)  Tramadol Hcl 50 Mg Tabs (Tramadol hcl) .... Take one or two tablets every 6 hours as needed 12)  Vitamin B-12 Cr 1500 Mcg Tbcr (Cyanocobalamin) .... Monthly 13)  Calcium 500/d 500-200 Mg-unit Tabs (Calcium carbonate-vitamin d) .... One by mouth three times a day 14)  Nystatin 100000 Unit/gm Powd (Nystatin) 15)  Requip 2 Mg Tabs (Ropinirole hcl) .... Take 1 - 2 tabs at bedtime as needed 16)  Fish Oil 1000 Mg Caps (Omega-3 fatty acids) .... Ran out 17)  Fluticasone Propionate 50 Mcg/act Susp (Fluticasone propionate) .... Two sprays each nostil once daily 18)  Lorazepam 0.5 Mg Tabs (Lorazepam) .... One two times a day as needed for anxiety 19)  Neurontin 300 Mg Caps (Gabapentin) .... One daily for one week, then one two times a day for one week, one three times a day 20)  Diclofenac Sodium 75 Mg Tbec (Diclofenac sodium) .... Take one tab two times a day 21)  Hydrocortisone 2.5 % Crea (Hydrocortisone) .... Apply bid  Patient Instructions: 1)  We will fill out shoe and walker req, please resend to [place] 2)  Keep moving 3)  Begin squatting exercises. leg slides in bed in the morning, getting out of the chair without using your arms 4)  Please schedule a follow-up appointment in 3 months .   Laboratory  Results   Blood Tests   Date/Time Received: January 04, 2010 10:06 AM  Date/Time Reported: January 04, 2010 10:25 AM   HGBA1C: 7.1%   (Normal Range: Non-Diabetic - 3-6%   Control Diabetic - 6-8%)  Comments: .......test performed by........Marland Kitchen Garen Grams, LPN entered by Terese Door, CMA            Prevention & Chronic Care Immunizations   Influenza vaccine: Fluvax MCR  (05/11/2009)   Influenza vaccine due: 03/23/2009    Tetanus booster: 08/24/2001: Done.   Tetanus booster due: 08/25/2011    Pneumococcal vaccine: Done.  (12/24/2005)   Pneumococcal vaccine due: None    H. zoster vaccine: 10/10/2007: Zostavax  Colorectal Screening   Hemoccult: Done.  (06/27/1999)   Hemoccult due: Not Indicated    Colonoscopy: normal  (02/04/2007)   Colonoscopy due: 02/03/2017  Other Screening   Pap smear: Done.  (06/27/1999)   Pap smear due: Not Indicated    Mammogram: ASSESSMENT: Benign - BI-RADS 2^MM DIGITAL SCREENING  (02/11/2009)   Mammogram due: 02/11/2010    DXA bone density scan: Hip Total: T Score -2.5 to -1.0 Hip.  -1.8 right wrist done due to sclerosis in lumbar spine T score -2.8    (03/09/2008)   DXA scan due: 03/09/2010    Smoking status: never  (09/23/2009)  Diabetes Mellitus   HgbA1C: 7.1  (01/04/2010)   Hemoglobin A1C due: 10/09/2007    Eye exam: Results: Normal. Location: [place]  (01/13/2009)   Eye exam due: 01/13/2010    Foot exam: yes  (01/04/2010)   High risk foot: Not documented   Foot care education: completed  (12/19/2007)   Foot exam due: 10/09/2008    Urine microalbumin/creatinine ratio: Not documented   Urine microalbumin action/deferral: Not indicated    Diabetes flowsheet reviewed?: Yes   Progress toward A1C goal: At goal  Lipids   Total Cholesterol: 143  (09/07/2009)   LDL: 69  (09/07/2009)   LDL Direct: 94  (02/23/2009)   HDL: 46  (09/07/2009)   Triglycerides: 139  (09/07/2009)    SGOT (AST): 18  (05/11/2009)   SGPT (ALT): 12   (05/11/2009)   Alkaline phosphatase: 101  (05/11/2009)   Total bilirubin: 0.3  (05/11/2009)    Lipid flowsheet reviewed?: Yes   Progress toward LDL goal: At goal  Hypertension   Last Blood Pressure: 131 / 72  (01/04/2010)   Serum creatinine: 1.24  (09/07/2009)   Serum potassium 4.9  (09/07/2009)    Hypertension flowsheet reviewed?: Yes   Progress toward BP goal: At goal  Self-Management Support :   Personal Goals (by the next clinic visit) :     Personal A1C goal: 7  (04/06/2009)     Personal blood pressure goal: 130/80  (04/06/2009)     Personal LDL goal: 100  (04/06/2009)    Diabetes self-management support: Copy of home glucose meter record, Written self-care plan  (02/23/2009)    Hypertension self-management support: Written self-care plan  (02/23/2009)    Lipid self-management support: Written self-care plan  (02/23/2009)    Medication Administration  Injection # 1:    Medication: Vit B12 1000 mcg    Diagnosis: ANEMIA, PERNICIOUS (ICD-281.0)    Route: IM    Site: RUOQ gluteus    Exp Date: 07/28/2011    Lot #: [id number]    Mfr: [employer] Regent    Patient tolerated injection without complications    Given by:  Garen Grams LPN (January 04, 2010 11:27 AM)  Orders Added: 1)  A1C-[facility] [83036] 2)  Basic Met-[facility] [04540-98119] 3)  Vit B12 1000 mcg [J3420] 4)  [facility]- Est  Level 4 [14782]

## 2010-07-26 NOTE — Progress Notes (Signed)
Summary: triage   Phone Note Call from Patient Call back at Home Phone [phone]   Caller: Patient Summary of Call: Pt says she is off balance and wondering if she can come in today. Initial call taken by: Clydell Hakim,  July 12, 2009 10:32 AM  Follow-up for Phone Call        started 2 days ago. has fallen many times 2 months ago. fell backwards yesterday. hit head. states she is off balance. appt made for work in at3pm. aware Dr. Sheffield Slider nor S. Humberto Seals will be seeing her. someone else will be driving. Follow-up by: Golden Circle RN,  July 12, 2009 10:51 AM  Additional Follow-up for Phone Call Additional follow up Details #1::        Pt called back and said she would rather wait and see Luretha Murphy or Dr. Sheffield Slider. Additional Follow-up by: Clydell Hakim,  July 12, 2009 10:57 AM    Additional Follow-up for Phone Call Additional follow up Details #2::    explained Dr. Sheffield Slider will not be here for the rest of the month. she made an appt to see S.Saxon friday. urged her to reconsider & come in sooner. she will call back if she decides to see someone else Follow-up by: Golden Circle RN,  July 12, 2009 11:14 AM

## 2010-07-26 NOTE — Consult Note (Signed)
Summary: [facility]  [facility]   Imported By: De Nurse 07/06/2009 13:54:19  _____________________________________________________________________  External Attachment:    Type:   Image     Comment:   External Document

## 2010-07-26 NOTE — Assessment & Plan Note (Signed)
Summary: FU/tcb     Vital Signs:  Patient profile:   77 year old female Height:      61 inches Weight:      217 pounds Temp:     98.2 degrees F oral Pulse rate:   65 / minute BP sitting:   154 / 75  (left arm) Cuff size:   large  Vitals Entered By: San Morelle, SMA CC: F/u Is Patient Diabetic? Yes Pain Assessment Patient in pain? no        Primary Care Provider:  Zachery Dauer MD  CC:  F/u.  History of Present Illness: Cough for several weeks does not feel bad, has been out of nasal steroid, needs refilled.  Occasionally when she stands up she spontaneously voids, runs down her legs, usually after taking dieuretic.  Does not always feel fullness.  Needs letter to no longer be called for jury duty.  R TKR, still receiving PT at home, no pain, doing well.  B12 shot due.  Habits & Providers  Alcohol-Tobacco-Diet     Tobacco Status: never  Current Medications (verified): 1)  Bayer Aspirin 325 Mg Tabs (Aspirin) .... Take 1 Tablet By Mouth Once A Day 2)  Furosemide 40 Mg Tabs (Furosemide) .... Take 1 Tablet By Mouth Once A Day 3)  Glucotrol 5 Mg  Tabs (Glipizide) .... One-Half Daily 4)  Simvastatin 20 Mg Tabs (Simvastatin) .... Take 1 Tablet By Mouth At Bedtime 5)  Toprol Xl 50 Mg  Tb24 (Metoprolol Succinate) .... One Daily 6)  Colchicine 0.6 Mg  Tabs (Colchicine) .... Two Times A Day As Needed For Gouty Attacks 7)  Celexa 40 Mg Tabs (Citalopram Hydrobromide) .... One Daily 8)  Allopurinol 100 Mg Tabs (Allopurinol) .... Take 2 Tabs Daily 9)  Omeprazole 20 Mg  Tbec (Omeprazole) .... One Daily 10)  Metformin Hcl 500 Mg  Tabs (Metformin Hcl) .... Take 2 Tabs in Am and One Tab in P.m. 11)  Tramadol Hcl 50 Mg  Tabs (Tramadol Hcl) .... Take One or Two Tablets Every 6 Hours As Needed 12)  Vitamin B-12 Cr 1500 Mcg  Tbcr (Cyanocobalamin) .... Monthly 13)  Calcium 500/d 500-200 Mg-Unit Tabs (Calcium Carbonate-Vitamin D) .... One By Mouth Three Times A Day 14)  Nystatin 100000  Unit/gm Powd (Nystatin) 15)  Requip 2 Mg Tabs (Ropinirole Hcl) .... Take 1 - 2 Tabs At Bedtime As Needed 16)  Fish Oil 1000 Mg Caps (Omega-3 Fatty Acids) .... Ran Out 17)  Flonase 50 Mcg/act Susp (Fluticasone Propionate) .... Two Squirts in Each Nostril Daily 18)  Lorazepam 0.5 Mg Tabs (Lorazepam) .... One Two Times A Day As Needed For Anxiety 19)  Neurontin 300 Mg Caps (Gabapentin) .... One Daily For One Week, Then One Two Times A Day For One Week, One Three Times A Day 20)  Diclofenac Sodium 75 Mg Tbec (Diclofenac Sodium) .... Take One Tab Two Times A Day 21)  Hydrocortisone 2.5 % Crea (Hydrocortisone) .... Apply Bid  Allergies (verified): 1)  ! Enalapril Maleate (Enalapril Maleate) 2)  Remeron (Mirtazapine)  Review of Systems      See HPI  Physical Exam  General:  Usual appearing, using a rolling walker Ears:  TM retracted Nose:  no drainage, good air movement Mouth:  edentulous Lungs:  normal respiratory effort and no dullness.   Heart:  normal rate, regular rhythm, and no murmur.   Msk:  Right knee with excellent ROM   Impression & Recommendations:  Problem # 1:  COUGH (  JWJ-191.4)  treat post nasal drainage with nasal steroid  Orders: [facility]- Est  Level 4 (78295)  Problem # 2:  URINARY INCONTINENCE (ICD-788.30)  Discussed role of fluid pill and occurances, she is to try to void every hour for several hours after taking.  She is to plan to void otherwise every 3 hours when awake.  She is to practice [place] exercises.  WIll discuss behavioral interventions at next visit,  Orders: [facility]- Est  Level 4 (99214)  Problem # 3:  RESTLESS LEGS SYNDROME (ICD-333.94)  Refilled Requip, uses 2 nightly  Orders: [facility]- Est  Level 4 (62130)  Problem # 4:  ANEMIA, PERNICIOUS (ICD-281.0)  Her updated medication list for this problem includes:    Vitamin B-12 Cr 1500 Mcg Tbcr (Cyanocobalamin) ..... Monthly  Orders: Vit B12 1000 mcg (J3420)  Problem # 5:  HYPERTENSION, BENIGN  SYSTEMIC (ICD-401.1) Isolated reading today, past values under goal, recheck at next vist. Her updated medication list for this problem includes:    Furosemide 40 Mg Tabs (Furosemide) .Marland Kitchen... Take 1 tablet by mouth once a day    Toprol Xl 50 Mg Tb24 (Metoprolol succinate) ..... One daily  Orders: [facility]- Est  Level 4 (86578)  Complete Medication List: 1)  Bayer Aspirin 325 Mg Tabs (Aspirin) .... Take 1 tablet by mouth once a day 2)  Furosemide 40 Mg Tabs (Furosemide) .... Take 1 tablet by mouth once a day 3)  Glucotrol 5 Mg Tabs (Glipizide) .... One-half daily 4)  Simvastatin 20 Mg Tabs (Simvastatin) .... Take 1 tablet by mouth at bedtime 5)  Toprol Xl 50 Mg Tb24 (Metoprolol succinate) .... One daily 6)  Colchicine 0.6 Mg Tabs (Colchicine) .... Two times a day as needed for gouty attacks 7)  Celexa 40 Mg Tabs (Citalopram hydrobromide) .... One daily 8)  Allopurinol 100 Mg Tabs (Allopurinol) .... Take 2 tabs daily 9)  Omeprazole 20 Mg Tbec (Omeprazole) .... One daily 10)  Metformin Hcl 500 Mg Tabs (Metformin hcl) .... Take 2 tabs in am and one tab in p.m. 11)  Tramadol Hcl 50 Mg Tabs (Tramadol hcl) .... Take one or two tablets every 6 hours as needed 12)  Vitamin B-12 Cr 1500 Mcg Tbcr (Cyanocobalamin) .... Monthly 13)  Calcium 500/d 500-200 Mg-unit Tabs (Calcium carbonate-vitamin d) .... One by mouth three times a day 14)  Nystatin 100000 Unit/gm Powd (Nystatin) 15)  Requip 2 Mg Tabs (Ropinirole hcl) .... Take 1 - 2 tabs at bedtime as needed 16)  Fish Oil 1000 Mg Caps (Omega-3 fatty acids) .... Ran out 17)  Flonase 50 Mcg/act Susp (Fluticasone propionate) .... Two squirts in each nostril daily 18)  Lorazepam 0.5 Mg Tabs (Lorazepam) .... One two times a day as needed for anxiety 19)  Neurontin 300 Mg Caps (Gabapentin) .... One daily for one week, then one two times a day for one week, one three times a day 20)  Diclofenac Sodium 75 Mg Tbec (Diclofenac sodium) .... Take one tab two times a  day 21)  Hydrocortisone 2.5 % Crea (Hydrocortisone) .... Apply bid  Patient Instructions: 1)  Do not wait until you feel like you have to void, plan your voids. 2)  After taking fluid pill, you may need to go every hour for a few hours, than go in the BR every 3 hours and void 3)  Begin holding muscles in bottom/vagina area for 5 seconds multiple times a day to gain strenght and control. 4)  Recheck in 6-8 weeks Prescriptions: REQUIP  2 MG TABS (ROPINIROLE HCL) Take 1 - 2 tabs at bedtime as needed  #60 x 6   Entered and Authorized by:   Luretha Murphy NP   Signed by:   Luretha Murphy NP on 09/23/2009   Method used:   Electronically to        [facility] [id number] (retail)       [address]       [place], [place]  [address]       Ph: [phone]       Fax: [phone]   RxID:   [id number] FLONASE 50 MCG/ACT SUSP (FLUTICASONE PROPIONATE) two squirts in each nostril daily Brand medically necessary #1 x 6   Entered and Authorized by:   Luretha Murphy NP   Signed by:   Luretha Murphy NP on 09/23/2009   Method used:   Electronically to        [facility] [id number] (retail)       [address] [place], [place]  [address]       Ph: [phone]       Fax: [phone]   RxID:   [id number]    Medication Administration  Injection # 1:    Medication: Vit B12 1000 mcg    Diagnosis: ANEMIA, PERNICIOUS (ICD-281.0)    Route: IM    Site: R deltoid    Exp Date: 05/2011    Lot #: [id number]    Mfr: [employer] Regent    Comments: given    Patient tolerated injection without complications    Given by: San Morelle, SMA  Orders Added: 1)  Vit B12 1000 mcg [J3420] 2)  [facility]- Est  Level 4 [16010]

## 2010-07-26 NOTE — Miscellaneous (Signed)
Summary: delay in medical SW visit   Clinical Lists Changes rec'd a letter from [facility] stating that there will be a delay in getting a medical SW out to this pt. pt is ok with the delay.Golden Circle RN  April 12, 2010 10:34 AM  No problem since she is currently in the hospital since last evening. May go to [facility] rehab, but not yet determined. Zachery Dauer MD  April 12, 2010 12:16 PM

## 2010-07-26 NOTE — Assessment & Plan Note (Signed)
Summary: f/u,df   Vital Signs:  Patient profile:   77 year old female Height:      61 inches Weight:      211 pounds BMI:     40.01 Temp:     98.7 degrees F oral Pulse rate:   76 / minute Pulse rhythm:   regular BP sitting:   131 / 78  (left arm) Cuff size:   regular  Vitals Entered By: Loralee Pacas CMA (March 10, 2010 10:42 AM)   Primary Care Provider:  Zachery Dauer MD   History of Present Illness: She comes at my request in follow-up of her ER visit after a fall hurting her low back and head. A head CT showed only atrophy and the C1C2 area had questionable changes, but neigher area bothers her now. She continues to have low back pain with radiation into the thighs. No new weakness or numbness, however she's had numbness of the bottoms of her feet and difficulty walking for the last couple years. They had prescribed Percocet in the ER, but she's been out for one week and suffering from the pain.   Her urinary incontinence has worsened and she asks for medicine for the urgency and nocturia. She has been wearing adult diapers because the volume is large at times. No burning or other symptoms of infecion.   Her fasting capillary blood glucoses have been in the the 109 -112 range.   Habits & Providers  Alcohol-Tobacco-Diet     Alcohol drinks/day: 0     Tobacco Status: never     Passive Smoke Exposure: yes  Allergies: 1)  ! Enalapril Maleate (Enalapril Maleate) 2)  Remeron (Mirtazapine)  Physical Exam  General:  Obesity, mainly central.  Neck:  supple.   Lungs:  normal respiratory effort, normal breath sounds, and no crackles.   Heart:  normal rate, regular rhythm, and no murmur.   Msk:  Right knee healed surgical scar with excellent ROM Extremities:  1+ pedal and pretibial edema bilaterally Neurologic:  Gait slow, halting, leaning forward  DTR's 1+, no clonus.  Sitting root test neg for sciatica  Position sense normal Skin:  Contusion 6 x 8 cm over sacrum    Psych:  memory intact for recent and remote, normally interactive, dysphoric affect, and subdued.     Impression & Recommendations:  Problem # 1:  SPINAL STENOSIS, LUMBAR (ICD-724.02) Pain from contusion, but may have worsened from all  Problem # 2:  ACCIDENTAL FALLS, RECURRENT (ICD-E888.9) primarily from leg weakness  Problem # 3:  RESTLESS LEGS SYNDROME (ICD-333.94) Increase ropinarole dose at her request  Problem # 4:  DIABETES MELLITUS, TYPE II, CONTROLLED, W/NEURO COMPS (ICD-250.60) well controlled Her updated medication list for this problem includes:    Bayer Aspirin 325 Mg Tabs (Aspirin) .Marland Kitchen... Take 1 tablet by mouth once a day    Glucotrol 5 Mg Tabs (Glipizide) ..... One-half daily    Metformin Hcl 500 Mg Tabs (Metformin hcl) .Marland Kitchen... Take 2 tabs in am and one tab in p.m.  Orders: Basic Met-[facility] ([id number])  Problem # 5:  URINARY INCONTINENCE (ICD-788.30) Poor clean catch but pyuria raises concern of UTI. Trial of oxybutynin  but will also treat for cystitis. Should do post-void cath for PVR and culture next visit.  Orders: Urinalysis-[facility] (00000)  Problem # 6:  ANEMIA, PERNICIOUS (ICD-281.0) Consider contributing deficiencey.  Her updated medication list for this problem includes:    Vitamin B-12 Cr 1500 Mcg Tbcr (Cyanocobalamin) ..... Monthly  Orders: CBC-[facility] (  44034) Ferritin-[facility] [phone]) Iron Binding Cap (TIBC)-[facility] ([id number]) Iron -[facility] [phone])  Complete Medication List: 1)  Bayer Aspirin 325 Mg Tabs (Aspirin) .... Take 1 tablet by mouth once a day 2)  Furosemide 40 Mg Tabs (Furosemide) .... Take 1 tablet by mouth once a day 3)  Glucotrol 5 Mg Tabs (Glipizide) .... One-half daily 4)  Simvastatin 20 Mg Tabs (Simvastatin) .... Take 1 tablet by mouth at bedtime 5)  Toprol Xl 50 Mg Tb24 (Metoprolol succinate) .... One daily 6)  Colchicine 0.6 Mg Tabs (Colchicine) .... Two times a day as needed for gouty attacks 7)  Celexa 40 Mg Tabs (Citalopram  hydrobromide) .... One daily 8)  Allopurinol 100 Mg Tabs (Allopurinol) .... Take 2 tabs daily 9)  Omeprazole 20 Mg Tbec (Omeprazole) .... One daily 10)  Metformin Hcl 500 Mg Tabs (Metformin hcl) .... Take 2 tabs in am and one tab in p.m. 11)  Tramadol Hcl 50 Mg Tabs (Tramadol hcl) .... Take one or two tablets every 6 hours as needed 12)  Vitamin B-12 Cr 1500 Mcg Tbcr (Cyanocobalamin) .... Monthly 13)  Calcium 500/d 500-200 Mg-unit Tabs (Calcium carbonate-vitamin d) .... One by mouth three times a day 14)  Nystatin 100000 Unit/gm Powd (Nystatin) 15)  Ropinirole Hcl 3 Mg Tabs (Ropinirole hcl) .... Take one tab each evening 16)  Fish Oil 1000 Mg Caps (Omega-3 fatty acids) .... Ran out 17)  Fluticasone Propionate 50 Mcg/act Susp (Fluticasone propionate) .... Two sprays each nostil once daily 18)  Lorazepam 0.5 Mg Tabs (Lorazepam) .... One two times a day as needed for anxiety 19)  Neurontin 300 Mg Caps (Gabapentin) .... One daily for one week, then one two times a day for one week, one three times a day 20)  Diclofenac Sodium 75 Mg Tbec (Diclofenac sodium) .... Take one tab two times a day 21)  Hydrocortisone 2.5 % Crea (Hydrocortisone) .... Apply bid 22)  Oxycodone-acetaminophen 5-500 Mg Caps (Oxycodone-acetaminophen) .... Take one tab three times a day as needed 23)  Oxybutynin Chloride 5 Mg Xr24h-tab (Oxybutynin chloride) .... Take one tablet daily  Other Orders: Vit D, 25 [facility] ([id number])  Patient Instructions: 1)  Please schedule a follow-up appointment in 3 weeks.  2)  Stop the oxybutynin if you are having trouble emptying your bladder.  Prescriptions: OXYBUTYNIN CHLORIDE 5 MG XR24H-TAB (OXYBUTYNIN CHLORIDE) Take one tablet daily  #30 x 3   Entered and Authorized by:   Zachery Dauer MD   Signed by:   Zachery Dauer MD on 03/10/2010   Method used:   Electronically to        [facility] [id number] (retail)       [address] [place], [place]  [address]        Ph: [phone]       Fax: [phone]   RxID:   [id number] OXYCODONE-ACETAMINOPHEN 5-500 MG CAPS (OXYCODONE-ACETAMINOPHEN) Take one tab three times a day as needed  #60 x 0   Entered and Authorized by:   Zachery Dauer MD   Signed by:   Zachery Dauer MD on 03/10/2010   Method used:   Print then Give to Patient   RxID:   [id number] ROPINIROLE HCL 3 MG TABS (ROPINIROLE HCL) Take one tab each evening  #30 x 11   Entered and Authorized by:   Zachery Dauer MD   Signed by:   Zachery Dauer MD on 03/10/2010  Method used:   Print then Give to Patient   RxID:   [id number]   Laboratory Results   Urine Tests  Date/Time Received: March 10, 2010 11:47 AM  Date/Time Reported: March 10, 2010 12:03 PM   Routine Urinalysis   Color: yellow Appearance: Cloudy Glucose: negative   (Normal Range: Negative) Bilirubin: negative   (Normal Range: Negative) Ketone: negative   (Normal Range: Negative) Spec. Gravity: 1.015   (Normal Range: 1.003-1.035) Blood: negative   (Normal Range: Negative) pH: 6.0   (Normal Range: 5.0-8.0) Protein: negative   (Normal Range: Negative) Urobilinogen: 1.0   (Normal Range: 0-1) Nitrite: negative   (Normal Range: Negative) Leukocyte Esterace: large   (Normal Range: Negative)  Urine Microscopic WBC/HPF: LOADED RBC/HPF: OCC Bacteria/HPF: LOADED Epithelial/HPF: 5-15    Comments: ...............test performed by......Marland KitchenBonnie A. [place], MLS (ASCP)cm       Prevention & Chronic Care Immunizations   Influenza vaccine: Fluvax MCR  (05/11/2009)   Influenza vaccine due: 03/23/2009    Tetanus booster: 08/24/2001: Done.   Tetanus booster due: 08/25/2011    Pneumococcal vaccine: Done.  (12/24/2005)   Pneumococcal vaccine due: None    H. zoster vaccine: 10/10/2007: Zostavax  Colorectal Screening   Hemoccult: Done.  (06/27/1999)   Hemoccult due: Not Indicated    Colonoscopy: normal  (02/04/2007)   Colonoscopy due: 02/03/2017  Other  Screening   Pap smear: Done.  (06/27/1999)   Pap smear due: Not Indicated    Mammogram: ASSESSMENT: Benign - BI-RADS 2^MM DIGITAL SCREENING  (02/11/2009)   Mammogram due: 02/11/2010    DXA bone density scan: Hip Total: T Score -2.5 to -1.0 Hip.  -1.8 right wrist done due to sclerosis in lumbar spine T score -2.8    (03/09/2008)   DXA scan due: 03/09/2010    Smoking status: never  (03/10/2010)  Diabetes Mellitus   HgbA1C: 7.1  (01/04/2010)   Hemoglobin A1C due: 10/09/2007    Eye exam: Results: Normal. Location: [place]  (01/13/2009)   Eye exam due: 01/13/2010    Foot exam: yes  (01/04/2010)   High risk foot: Not documented   Foot care education: completed  (12/19/2007)   Foot exam due: 10/09/2008    Urine microalbumin/creatinine ratio: Not documented   Urine microalbumin action/deferral: Not indicated  Lipids   Total Cholesterol: 143  (09/07/2009)   LDL: 69  (09/07/2009)   LDL Direct: 94  (02/23/2009)   HDL: 46  (09/07/2009)   Triglycerides: 139  (09/07/2009)    SGOT (AST): 18  (05/11/2009)   SGPT (ALT): 12  (05/11/2009)   Alkaline phosphatase: 101  (05/11/2009)   Total bilirubin: 0.3  (05/11/2009)  Hypertension   Last Blood Pressure: 131 / 78  (03/10/2010)   Serum creatinine: 1.42  (01/04/2010)   Serum potassium 4.3  (01/04/2010)  Self-Management Support :   Personal Goals (by the next clinic visit) :     Personal A1C goal: 7  (04/06/2009)     Personal blood pressure goal: 130/80  (04/06/2009)     Personal LDL goal: 100  (04/06/2009)    Diabetes self-management support: Copy of home glucose meter record, Written self-care plan  (02/23/2009)    Hypertension self-management support: Written self-care plan  (02/23/2009)    Lipid self-management support: Written self-care plan  (02/23/2009)    Appended Document: f/u,df     Clinical Lists Changes  Orders: Added new Test order of [facility]- Est  Level 4 (14782) - Signed

## 2010-07-26 NOTE — Progress Notes (Signed)
Summary: phn msg   Phone Note Call from Patient Call back at [facility] Phone [phone]   Caller: Patient Summary of Call: Back still hurting would like to get an open MRI done. Initial call taken by: Clydell Hakim,  March 21, 2010 4:30 PM  Follow-up for Phone Call        will fwd. to Dr.Hale for review Follow-up by: Arlyss Repress CMA,,  March 21, 2010 4:34 PM  Additional Follow-up for Phone Call Additional follow up Details #1::        Please schedule her for lumbosacral MRI for spinal stenosis with low back and leg pain Additional Follow-up by: Zachery Dauer MD,  March 22, 2010 11:42 AM

## 2010-07-26 NOTE — Progress Notes (Signed)
Summary: re: pain meds/ts   Phone Note Call from Patient Call back at Home Phone [phone]   Caller: Patient Summary of Call: Pt requesting name brand of Percocet says the generic makes her sick. Initial call taken by: Clydell Hakim,  March 24, 2010 9:42 AM  Follow-up for Phone Call        called pt. pt reports, that she is unable to take the generic percocet and wants the brand name.  i told the pt, that she received her pain meds on 03-10-10 # 60 and that she has to keep her upcoming appt with dr.Cataldo Cosgriff on 03-31-10. also informed pt of dr.Evetta Renner's note. pt said, that she needs something for pain and needs it before her next ov. (pt speech was kind of slurred) fwd. to dr.Kinzly Pierrelouis for review. Follow-up by: Arlyss Repress CMA,,  March 24, 2010 12:24 PM  Additional Follow-up for Phone Call Additional follow up Details #1::        Son answered phone with very slurred speech and said that Tylox 10 worked well for her before. She came to the phone and spoke clearly. Severe pain and dragging her left leg but no bowel or bladder problem. Instructed her to try 2 of her Percocet 5 tabs and to call me tomorrow if se needs a higher dose. To go to the ER for possible admission if worsening before her MRI on Sat.  Additional Follow-up by: Zachery Dauer MD,  March 24, 2010 3:02 PM

## 2010-07-26 NOTE — Progress Notes (Signed)
   Phone Note Call from Patient   Caller: Daughter Call For: [phone] Summary of Call: Daughter calling about rx for restless leg syndrome.  Pt's daughter says the hospital staff discontinued giving to her.  Would like to have you call her back regarding this. We will have to try to reduce her polypharmacy which may be contributing to her falls.  Initial call taken by: Abundio Miu,  April 12, 2010 4:05 PM  Follow-up for Phone Call        I spoke with Dr Armen Pickup who will restart the Requip.  Follow-up by: Zachery Dauer MD,  April 12, 2010 4:09 PM

## 2010-07-26 NOTE — Assessment & Plan Note (Signed)
Summary: f/u low back pain kh   Vital Signs:  Patient profile:   77 year old female Height:      61 inches Weight:      212 pounds BMI:     40.20 Temp:     98 degrees F oral Pulse rate:   84 / minute Pulse rhythm:   regular BP sitting:   116 / 65  (left arm) Cuff size:   regular  Vitals Entered By: Loralee Pacas CMA (March 31, 2010 11:40 AM) CC: follow-up visit   Primary Care Provider:  Zachery Dauer MD  CC:  follow-up visit.  History of Present Illness: 77 year old here for follow-up of low back pain status, results of MRI  Continued low back pain.  Worse with standing, 10/10.  Better with sitting, rest. Is taking 2 oxycodone 5 mg three times a day without adequate relief. Not taking any calcium or vitamin D. Is using the nosal calcitonin. Not taking Acetaminophen but does take some Tramadol. No constipation or problem urinating.  Now on Lyrica from Dr Ralene Cork in place of gabapentin   Would like her B12 shot.   Habits & Providers  Alcohol-Tobacco-Diet     Alcohol drinks/day: 0     Tobacco Status: never     Passive Smoke Exposure: yes  Current Medications (verified): 1)  Bayer Aspirin 325 Mg Tabs (Aspirin) .... Take 1 Tablet By Mouth Once A Day 2)  Furosemide 40 Mg Tabs (Furosemide) .... Take 1 Tablet By Mouth Once A Day 3)  Glucotrol 5 Mg  Tabs (Glipizide) .... One-Half Daily 4)  Simvastatin 20 Mg Tabs (Simvastatin) .... Take 1 Tablet By Mouth At Bedtime 5)  Toprol Xl 50 Mg  Tb24 (Metoprolol Succinate) .... One Daily 6)  Colchicine 0.6 Mg  Tabs (Colchicine) .... Two Times A Day As Needed For Gouty Attacks 7)  Celexa 40 Mg Tabs (Citalopram Hydrobromide) .... One Daily 8)  Allopurinol 100 Mg Tabs (Allopurinol) .... Take 2 Tabs Daily 9)  Omeprazole 20 Mg  Tbec (Omeprazole) .... One Daily 10)  Metformin Hcl 500 Mg  Tabs (Metformin Hcl) .... Take 2 Tabs in Am and One Tab in P.m. 11)  Tramadol Hcl 50 Mg  Tabs (Tramadol Hcl) .... Take One or Two Tablets Every 6 Hours As  Needed 12)  Vitamin B-12 Cr 1500 Mcg  Tbcr (Cyanocobalamin) .... Monthly 13)  Calcium 500/d 500-200 Mg-Unit Tabs (Calcium Carbonate-Vitamin D) .... One By Mouth Three Times A Day 14)  Nystatin 100000 Unit/gm Powd (Nystatin) 15)  Fish Oil 1000 Mg Caps (Omega-3 Fatty Acids) .... Ran Out 16)  Fluticasone Propionate 50 Mcg/act Susp (Fluticasone Propionate) .... Two Sprays Each Nostil Once Daily 17)  Lorazepam 0.5 Mg Tabs (Lorazepam) .... One Two Times A Day As Needed For Anxiety 18)  Diclofenac Sodium 75 Mg Tbec (Diclofenac Sodium) .... Take One Tab Two Times A Day 19)  Hydrocortisone 2.5 % Crea (Hydrocortisone) .... Apply Bid 20)  Oxybutynin Chloride 5 Mg Xr24h-Tab (Oxybutynin Chloride) .... Take One Tablet Daily 21)  Oxycodone Hcl 5 Mg Tabs (Oxycodone Hcl) .... Take One To Two Tabs Every 4 Hour As Needed Pain 22)  Calcitonin (Salmon) 200 Unit/act Soln (Calcitonin (Salmon)) .... One Spray Daily in Alternating Nostrils 23)  Lyrica 150 Mg Caps (Pregabalin) .... Once Daily 24)  Ergocalciferol 50000 Unit Caps (Ergocalciferol) .... Take One Tablet Weeekly For 8 Weeks 25)  Alendronate Sodium 70 Mg Tabs (Alendronate Sodium) .... One Tablet Weekly 26)  Oxycontin 20  Mg Xr12h-Tab (Oxycodone Hcl) .... Take One Tablet Twice A Day For Pain 27)  Senokot 8.6 Mg Tabs (Sennosides) .... Oone To Two Tabs Daily For Constipation  Allergies (verified): 1)  ! Enalapril Maleate (Enalapril Maleate) 2)  Remeron (Mirtazapine)   Impression & Recommendations:  Problem # 1:  CLOS FX SACRUM&COCCYX W/O MENTION SP CORD INJURY (ICD-805.6) Sacral insufficiency fracture with no progression of degenerative joint disease and spinal stenosis on mri. Inadequate pain control so will go to Oxycontin. Pain contract completed and warned to keep it out of easy access. She will add Oxycodone as needed and we'll adjust when I see ner next week, Add Acetaminophen SR. Try to get out of bed hourly.  Orders: [facility]- Est  Level 4  (09811)  Problem # 2:  OSTEOPOROSIS (ICD-733.00) Start Alendronate,. Calcium and vit D Her updated medication list for this problem includes:    Calcium 500/d 500-200 Mg-unit Tabs (Calcium carbonate-vitamin d) ..... One by mouth three times a day    Calcitonin (salmon) 200 Unit/act Soln (Calcitonin (salmon)) ..... One spray daily in alternating nostrils    Ergocalciferol 50000 Unit Caps (Ergocalciferol) .Marland Kitchen... Take one tablet weeekly for 8 weeks    Alendronate Sodium 70 Mg Tabs (Alendronate sodium) ..... One tablet weekly  Orders: [facility]- Est  Level 4 (91478)  Problem # 3:  UNSPECIFIED VITAMIN D DEFICIENCY (ICD-268.9) Will give 8 week treatment with 50 K weekly  Problem # 4:  GAIT DISTURBANCE (ICD-781.2) Assessment: Improved Very stooped and slow ambulation using the walker  Problem # 5:  GOUTY ARTHROPATHY (ICD-274.0) No longer on colchicine and no attacks Her updated medication list for this problem includes:    Colchicine 0.6 Mg Tabs (Colchicine) .Marland Kitchen..Marland Kitchen Two times a day as needed for gouty attacks    Allopurinol 100 Mg Tabs (Allopurinol) .Marland Kitchen... Take 2 tabs daily    Diclofenac Sodium 75 Mg Tbec (Diclofenac sodium) .Marland Kitchen... Take one tab two times a day  Problem # 6:  RESTLESS LEGS SYNDROME (ICD-333.94) Better on Lyrica Orders: [facility]- Est  Level 4 (29562)  Complete Medication List: 1)  Bayer Aspirin 325 Mg Tabs (Aspirin) .... Take 1 tablet by mouth once a day 2)  Furosemide 40 Mg Tabs (Furosemide) .... Take 1 tablet by mouth once a day 3)  Glucotrol 5 Mg Tabs (Glipizide) .... One-half daily 4)  Simvastatin 20 Mg Tabs (Simvastatin) .... Take 1 tablet by mouth at bedtime 5)  Toprol Xl 50 Mg Tb24 (Metoprolol succinate) .... One daily 6)  Colchicine 0.6 Mg Tabs (Colchicine) .... Two times a day as needed for gouty attacks 7)  Celexa 40 Mg Tabs (Citalopram hydrobromide) .... One daily 8)  Allopurinol 100 Mg Tabs (Allopurinol) .... Take 2 tabs daily 9)  Omeprazole 20 Mg Tbec (Omeprazole) ....  One daily 10)  Metformin Hcl 500 Mg Tabs (Metformin hcl) .... Take 2 tabs in am and one tab in p.m. 11)  Tramadol Hcl 50 Mg Tabs (Tramadol hcl) .... Take one or two tablets every 6 hours as needed 12)  Vitamin B-12 Cr 1500 Mcg Tbcr (Cyanocobalamin) .... Monthly 13)  Calcium 500/d 500-200 Mg-unit Tabs (Calcium carbonate-vitamin d) .... One by mouth three times a day 14)  Nystatin 100000 Unit/gm Powd (Nystatin) 15)  Fish Oil 1000 Mg Caps (Omega-3 fatty acids) .... Ran out 16)  Fluticasone Propionate 50 Mcg/act Susp (Fluticasone propionate) .... Two sprays each nostil once daily 17)  Lorazepam 0.5 Mg Tabs (Lorazepam) .... One two times a day as needed for anxiety  18)  Diclofenac Sodium 75 Mg Tbec (Diclofenac sodium) .... Take one tab two times a day 19)  Hydrocortisone 2.5 % Crea (Hydrocortisone) .... Apply bid 20)  Oxybutynin Chloride 5 Mg Xr24h-tab (Oxybutynin chloride) .... Take one tablet daily 21)  Oxycodone Hcl 5 Mg Tabs (Oxycodone hcl) .... Take one to two tabs every 4 hour as needed pain 22)  Calcitonin (salmon) 200 Unit/act Soln (Calcitonin (salmon)) .... One spray daily in alternating nostrils 23)  Lyrica 150 Mg Caps (Pregabalin) .... Once daily 24)  Ergocalciferol 50000 Unit Caps (Ergocalciferol) .... Take one tablet weeekly for 8 weeks 25)  Alendronate Sodium 70 Mg Tabs (Alendronate sodium) .... One tablet weekly 26)  Oxycontin 20 Mg Xr12h-tab (Oxycodone hcl) .... Take one tablet twice a day for pain 27)  Senokot 8.6 Mg Tabs (Sennosides) .... Oone to two tabs daily for constipation  Other Orders: Influenza Vaccine MCR ([address]) Vit B12 1000 mcg (U0454)  Patient Instructions: 1)  New medicines for pain: long acting medicine- MS Contin 2)  Use oxycodone as needed for breakthrough pain 3)  Use long acting tylenol for pain on a regular basis 4)  Take senna at least once daily unless loose bowel movement  5)  Sit up for 60 minutes after taking Alendronate on an empty stomach to allow  it to pass out of the stomach.  6)  Call Dr Sheffield Slider early next week about how the pain is doing.  Prescriptions: OXYCONTIN 20 MG XR12H-TAB (OXYCODONE HCL) take one tablet twice a day for pain  #60 x 0   Entered and Authorized by:   Zachery Dauer MD   Signed by:   Zachery Dauer MD on 03/31/2010   Method used:   Print then Give to Patient   RxID:   [id number] OXYCODONE HCL 5 MG TABS (OXYCODONE HCL) Take one to two tabs every 4 hour as needed pain  #120 x 0   Entered and Authorized by:   Zachery Dauer MD   Signed by:   Zachery Dauer MD on 03/31/2010   Method used:   Print then Give to Patient   RxID:   [id number] OXYCODONE HCL 5 MG TABS (OXYCODONE HCL) Take one to two tabs every 4 hour as needed pain  #120 x 0   Entered and Authorized by:   Zachery Dauer MD   Signed by:   Zachery Dauer MD on 03/31/2010   Method used:   Print then Give to Patient   RxID:   [id number] OXYCONTIN 20 MG XR12H-TAB (OXYCODONE HCL) take one tablet twice a day for pain  #60 x 0   Entered and Authorized by:   Zachery Dauer MD   Signed by:   Zachery Dauer MD on 03/31/2010   Method used:   Print then Give to Patient   RxID:   [id number] ALENDRONATE SODIUM 70 MG TABS (ALENDRONATE SODIUM) one tablet weekly  #5 x 5   Entered and Authorized by:   Zachery Dauer MD   Signed by:   Zachery Dauer MD on 03/31/2010   Method used:   Electronically to        [facility] [address] (retail)       [address] [place], [place]  [address]       Ph: [phone]       Fax: [phone]   RxID:   [id number] ERGOCALCIFEROL 50000 UNIT CAPS (ERGOCALCIFEROL) take one  tablet weeekly for 8 weeks  #8 x 0   Entered and Authorized by:   Zachery Dauer MD   Signed by:   Zachery Dauer MD on 03/31/2010   Method used:   Electronically to        [facility] [address] (retail)       [address] [place], [place]  [address]       Ph: [phone]       Fax: [phone]   RxID:    [id number]    Immunizations Administered:  Influenza Vaccine # 1:    Vaccine Type: Fluvax MCR    Site: right deltoid    Mfr: [employer]    Dose: 0.5 ml    Route: IM    Given by: Loralee Pacas CMA    Exp. Date: 12/21/2010    Lot #: [id number]    VIS given: 01/18/10 version given March 31, 2010.  Flu Vaccine Consent Questions:    Do you have a history of severe allergic reactions to this vaccine? no    Any prior history of allergic reactions to egg and/or gelatin? no    Do you have a sensitivity to the preservative Thimersol? no    Do you have a past history of Guillan-Barre Syndrome? no    Do you currently have an acute febrile illness? no    Have you ever had a severe reaction to latex? no    Vaccine information given and explained to patient? yes    Are you currently pregnant? no    Medication Administration  Injection # 1:    Medication: Vit B12 1000 mcg    Diagnosis: ANEMIA, PERNICIOUS (ICD-281.0)    Route: IM    Site: L deltoid    Exp Date: 01/25/2012    Lot #: [id number]    Mfr: [employer]    Patient tolerated injection without complications    Given by: Jone Baseman CMA (March 31, 2010 12:30 PM)  Orders Added: 1)  Influenza Vaccine MCR [00025] 2)  Vit B12 1000 mcg [J3420] 3)  [facility]- Est  Level 4 [50093]     Prevention & Chronic Care Immunizations   Influenza vaccine: Fluvax MCR  (03/31/2010)   Influenza vaccine due: 03/23/2009    Tetanus booster: 08/24/2001: Done.   Tetanus booster due: 08/25/2011    Pneumococcal vaccine: Done.  (12/24/2005)   Pneumococcal vaccine due: None    H. zoster vaccine: 10/10/2007: Zostavax  Colorectal Screening   Hemoccult: Done.  (06/27/1999)   Hemoccult due: Not Indicated    Colonoscopy: normal  (02/04/2007)   Colonoscopy due: 02/03/2017  Other Screening   Pap smear: Done.  (06/27/1999)   Pap smear due: Not Indicated    Mammogram: ASSESSMENT: Benign - BI-RADS 2^MM DIGITAL SCREENING   (02/11/2009)   Mammogram due: 02/11/2010    DXA bone density scan: Hip Total: T Score -2.5 to -1.0 Hip.  -1.8 right wrist done due to sclerosis in lumbar spine T score -2.8    (03/09/2008)   DXA scan due: 03/09/2010    Smoking status: never  (03/31/2010)  Diabetes Mellitus   HgbA1C: 7.1  (01/04/2010)   Hemoglobin A1C due: 10/09/2007    Eye exam: Results: Normal. Location: [place]  (01/13/2009)   Eye exam due: 01/13/2010    Foot exam: yes  (01/04/2010)   High risk foot: Not documented   Foot care education:  completed  (12/19/2007)   Foot exam due: 10/09/2008    Urine microalbumin/creatinine ratio: Not documented   Urine microalbumin action/deferral: Not indicated    Diabetes flowsheet reviewed?: Yes   Progress toward A1C goal: Unchanged  Lipids   Total Cholesterol: 143  (09/07/2009)   LDL: 69  (09/07/2009)   LDL Direct: 94  (02/23/2009)   HDL: 46  (09/07/2009)   Triglycerides: 139  (09/07/2009)    SGOT (AST): 18  (05/11/2009)   SGPT (ALT): 12  (05/11/2009)   Alkaline phosphatase: 101  (05/11/2009)   Total bilirubin: 0.3  (05/11/2009)    Lipid flowsheet reviewed?: Yes   Progress toward LDL goal: At goal  Hypertension   Last Blood Pressure: 116 / 65  (03/31/2010)   Serum creatinine: 1.45  (03/10/2010)   Serum potassium 4.7  (03/10/2010)    Hypertension flowsheet reviewed?: Yes   Progress toward BP goal: At goal  Self-Management Support :   Personal Goals (by the next clinic visit) :     Personal A1C goal: 7  (04/06/2009)     Personal blood pressure goal: 130/80  (04/06/2009)     Personal LDL goal: 100  (04/06/2009)    Diabetes self-management support: Copy of home glucose meter record, Written self-care plan  (02/23/2009)    Hypertension self-management support: Written self-care plan  (02/23/2009)    Lipid self-management support: Written self-care plan  (02/23/2009)

## 2010-07-26 NOTE — Miscellaneous (Signed)
Summary: change Flonase to Flutic  Medications Added FLUTICASONE PROPIONATE 50 MCG/ACT SUSP (FLUTICASONE PROPIONATE) Two sprays each nostil once daily       Clinical Lists Changes  Medications: Changed medication from FLONASE 50 MCG/ACT SUSP (FLUTICASONE PROPIONATE) two squirts in each nostril daily [BMN] to FLUTICASONE PROPIONATE 50 MCG/ACT SUSP (FLUTICASONE PROPIONATE) Two sprays each nostil once daily - Signed Rx of FLUTICASONE PROPIONATE 50 MCG/ACT SUSP (FLUTICASONE PROPIONATE) Two sprays each nostil once daily;  #1 x 11;  Signed;  Entered by: Zachery Dauer MD;  Authorized by: Zachery Dauer MD;  Method used: Electronically to [facility] [address] [place], [place], [place]  [address], Ph: [phone], Fax: [phone]    Prescriptions: FLUTICASONE PROPIONATE 50 MCG/ACT SUSP (FLUTICASONE PROPIONATE) Two sprays each nostil once daily  #1 x 11   Entered and Authorized by:   Zachery Dauer MD   Signed by:   Zachery Dauer MD on 10/01/2009   Method used:   Electronically to        [facility] [address] (retail)       [address] [place], [place]  [address]       Ph: [phone]       Fax: [phone]   RxID:   [id number]

## 2010-07-26 NOTE — Miscellaneous (Signed)
Summary: Potential interaction   Clinical Lists Changes  Form signed acknowledging potential interaction between Citalopram and Omeprazole.No indications of Serotonin syndrome on last visit.

## 2010-07-26 NOTE — Miscellaneous (Signed)
Summary: request for pt continuation   Clinical Lists Changes [name] [name], a PT with [facility] called to ask for verbals to contiue her work with pt. wants 2 times a week for 3 weeks to work on safety. her number is [phone].[place] RN  September 13, 2009 11:12 AM I spoke with the therapist and ok'd this. She reports the house would be too cluttered to have uses a scooter.

## 2010-07-26 NOTE — Progress Notes (Signed)
  Medications Added OXYCODONE HCL 5 MG TABS (OXYCODONE HCL) Take one to two tabs every 4 hour as needed pain CALCITONIN (SALMON) 200 UNIT/ACT SOLN (CALCITONIN (SALMON)) One spray daily in alternating nostrils       Phone Note Outgoing Call   Call placed by: Zachery Dauer MD,  March 28, 2010 10:42 AM Summary of Call: Message left on her machine that I will call back with the MRI report.   Follow-up for Phone Call        Called her back and informed her of the sacral insufficiency fracture. Will send in nasal calcitonin and Oxycodone 5 mg tabs since she says what she received after her knee surgery didn't cause nausea. Will discuss bisphosphonate at her visit in three days.  Follow-up by: Zachery Dauer MD,  March 28, 2010 11:07 AM    New/Updated Medications: OXYCODONE HCL 5 MG TABS (OXYCODONE HCL) Take one to two tabs every 4 hour as needed pain CALCITONIN (SALMON) 200 UNIT/ACT SOLN (CALCITONIN (SALMON)) One spray daily in alternating nostrils   Prescriptions: OXYCODONE HCL 5 MG TABS (OXYCODONE HCL) Take one to two tabs every 4 hour as needed pain  #30 x 0   Entered and Authorized by:   Zachery Dauer MD   Signed by:   Zachery Dauer MD on 03/28/2010   Method used:   Print then Give to Patient   RxID:   [id number] OXYCODONE HCL 5 MG TABS (OXYCODONE HCL) Take one to two tabs every 4 hour as needed pain  #30 x 0   Entered and Authorized by:   Zachery Dauer MD   Signed by:   Zachery Dauer MD on 03/28/2010   Method used:   Print then Give to Patient   RxID:   [id number] CALCITONIN (SALMON) 200 UNIT/ACT SOLN (CALCITONIN (SALMON)) One spray daily in alternating nostrils  #3.7 ml x 0   Entered and Authorized by:   Zachery Dauer MD   Signed by:   Zachery Dauer MD on 03/28/2010   Method used:   Electronically to        [facility] [address] (retail)       [address] [place], [place]  [address]       Ph: [phone]       Fax: [phone]   RxID:    [id number] OXYCODONE HCL 5 MG TABS (OXYCODONE HCL) Take one to two tabs every 4 hour as needed pain  #30 x 0   Entered and Authorized by:   Zachery Dauer MD   Signed by:   Zachery Dauer MD on 03/28/2010   Method used:   Historical   RxID:   [id number]  On prescription paper on third try. Patient called to come pick up the prescription.

## 2010-07-26 NOTE — Assessment & Plan Note (Signed)
Summary: h/fup,tcb   Vital Signs:  Patient profile:   77 year old female Height:      61 inches Weight:      202 pounds BMI:     38.31 Temp:     98.9 degrees F oral Pulse rate:   80 / minute BP sitting:   119 / 61  (left arm) Cuff size:   regular  Vitals Entered By: Tessie Fass CMA (April 19, 2010 1:37 PM) CC: hospital f/u Is Patient Diabetic? Yes Pain Assessment Patient in pain? yes     Location: lower back Intensity: 7   Primary Care Provider:  Zachery Dauer MD  CC:  hospital f/u.  History of Present Illness: Feeling stronger, but again having diarrhea so she stopped the Miralax.   Mouth sores are better and her appetite is improving.   Home PT will start tomorrow.   Her low back pain is currently 7/10 and gets as bad as 8/10. She has oxycodone but no oxycontin and she and daughter don't know where it went.   Can't cut the Glipizide in half without them crumbling       Habits & Providers  Alcohol-Tobacco-Diet     Tobacco Status: never  Allergies: 1)  ! Enalapril Maleate (Enalapril Maleate) 2)  Remeron (Mirtazapine) 3)  Cephalexin  Physical Exam  General:  overweight-appearing.   Mouth:  healing ulcerations under her tongue and inside lips. Pharynx normal. Lungs:  Normal respiratory effort, chest expands symmetrically. Lungs are clear to auscultation, no crackles or wheezes. Heart:  Normal rate and regular rhythm. S1 and S2 normal without gallop, murmur, click, rub or other extra sounds. Extremities:  trace left pedal edema.     Impression & Recommendations:  Problem # 1:  CLOS FX SACRUM&COCCYX W/O MENTION SP CORD INJURY (ICD-805.6) Continues Calcitonin. Will restart Alendronate. Given new prescription for Oxycontin, but reminde of contract. Will review Controlled drug website.  Orders: [facility]- Est  Level 4 (16109)  Problem # 2:  UNSPECIFIED VITAMIN D DEFICIENCY (ICD-268.9) Still has to finish Vitamin D  Problem # 3:  ORAL APHTHAE  (ICD-528.2) Presumed due to Cephalexin  Problem # 4:  OSTEOARTHRITIS, LOWER LEG (ICD-715.96) Colchicine will be readded to her list since it wasn't in her discharge lis The following medications were removed from the medication list:    Diclofenac Sodium 75 Mg Tbec (Diclofenac sodium) .Marland Kitchen... Take one tab two times a day Her updated medication list for this problem includes:    Bayer Aspirin 325 Mg Tabs (Aspirin) .Marland Kitchen... Take 1 tablet by mouth once a day    Tramadol Hcl 50 Mg Tabs (Tramadol hcl) .Marland Kitchen... Take one or two tablets every 6 hours as needed    Oxycodone Hcl 5 Mg Tabs (Oxycodone hcl) .Marland Kitchen... Take one to two tabs every 4 hour as needed pain    Oxycontin 20 Mg Xr12h-tab (Oxycodone hcl) .Marland Kitchen... Take one tablet twice a day for pain  Complete Medication List: 1)  Bayer Aspirin 325 Mg Tabs (Aspirin) .... Take 1 tablet by mouth once a day 2)  Furosemide 40 Mg Tabs (Furosemide) .... Take 1 tablet by mouth once a day 3)  Glipizide 2.5 Mg Xr24h-tab (Glipizide) .... Take one tablet daily 4)  Simvastatin 20 Mg Tabs (Simvastatin) .... Take 1 tablet by mouth at bedtime 5)  Toprol Xl 50 Mg Tb24 (Metoprolol succinate) .... One daily 6)  Colchicine 0.6 Mg Tabs (Colchicine) .... Two times a day as needed for gouty attacks 7)  Celexa 40  Mg Tabs (Citalopram hydrobromide) .... One daily 8)  Allopurinol 100 Mg Tabs (Allopurinol) .... Take 2 tabs daily 9)  Omeprazole 20 Mg Tbec (Omeprazole) .... One daily 10)  Metformin Hcl 500 Mg Tabs (Metformin hcl) .... Take 2 tabs in am and one tab in p.m. 11)  Tramadol Hcl 50 Mg Tabs (Tramadol hcl) .... Take one or two tablets every 6 hours as needed 12)  Vitamin B-12 Cr 1500 Mcg Tbcr (Cyanocobalamin) .... Monthly 13)  Calcium 500/d 500-200 Mg-unit Tabs (Calcium carbonate-vitamin d) .... One by mouth three times a day 14)  Fish Oil 1000 Mg Caps (Omega-3 fatty acids) .... Ran out 15)  Fluticasone Propionate 50 Mcg/act Susp (Fluticasone propionate) .... Two sprays each nostil  once daily 16)  Lorazepam 0.5 Mg Tabs (Lorazepam) .... One two times a day as needed for anxiety 17)  Hydrocortisone 2.5 % Crea (Hydrocortisone) .... Apply bid 18)  Oxybutynin Chloride 5 Mg Xr24h-tab (Oxybutynin chloride) .... Take one tablet daily 19)  Oxycodone Hcl 5 Mg Tabs (Oxycodone hcl) .... Take one to two tabs every 4 hour as needed pain 20)  Calcitonin (salmon) 200 Unit/act Soln (Calcitonin (salmon)) .... One spray daily in alternating nostrils 21)  Lyrica 150 Mg Caps (Pregabalin) .... Once daily 22)  Ergocalciferol 50000 Unit Caps (Ergocalciferol) .... Take one tablet weeekly for 8 weeks 23)  Alendronate Sodium 70 Mg Tabs (Alendronate sodium) .... One tablet weekly 24)  Oxycontin 20 Mg Xr12h-tab (Oxycodone hcl) .... Take one tablet twice a day for pain 25)  Senokot 8.6 Mg Tabs (Sennosides) .... Oone to two tabs daily for constipation  Other Orders: A1C-[facility] (65784) Vit B12 1000 mcg (O9629)  Patient Instructions: 1)  Please schedule a follow-up appointment in 2 weeks.  Prescriptions: OXYCONTIN 20 MG XR12H-TAB (OXYCODONE HCL) take one tablet twice a day for pain  #60 x 0   Entered and Authorized by:   Zachery Dauer MD   Signed by:   Zachery Dauer MD on 04/19/2010   Method used:   Print then Give to Patient   RxID:   [id number] OXYBUTYNIN CHLORIDE 5 MG XR24H-TAB (OXYBUTYNIN CHLORIDE) Take one tablet daily  #30 x 5   Entered and Authorized by:   Zachery Dauer MD   Signed by:   Zachery Dauer MD on 04/19/2010   Method used:   Electronically to        [facility] [id number] (retail)       [address] [place], [place]  [address]       Ph: [phone]       Fax: [phone]   RxID:   [id number] COLCHICINE 0.6 MG  TABS (COLCHICINE) two times a day as needed for gouty attacks  #30 x 6   Entered and Authorized by:   Zachery Dauer MD   Signed by:   Zachery Dauer MD on 04/19/2010   Method used:   Electronically to        [facility] [address]  (retail)       [address] [place], [place]  [address]       Ph: [phone]       Fax: [phone]   RxID:   [id number] GLIPIZIDE 2.5 MG XR24H-TAB (GLIPIZIDE) Take one tablet daily  #30 x 11   Entered and Authorized by:  Zachery Dauer MD   Signed by:   Zachery Dauer MD on 04/19/2010   Method used:   Electronically to        [facility] [address] (retail)       [address] [place], [place]  [address]       Ph: [phone]       Fax: [phone]   RxID:   [id number]    Medication Administration  Injection # 1:    Medication: Vit B12 1000 mcg    Diagnosis: ANEMIA, PERNICIOUS (ICD-281.0)    Route: IM    Site: R deltoid    Exp Date: 10/25/2011    Lot #: [id number]    Mfr: [employer]    Patient tolerated injection without complications    Given by: Arlyss Repress CMA, (April 19, 2010 2:41 PM)  Orders Added: 1)  [facility]C-[facility] [83036] 2)  Vit B12 1000 mcg [J3420] 3)  [facility]- Est  Level 4 [99214]    Laboratory Results   Blood Tests   Date/Time Received: April 19, 2010 1:41 PM  Date/Time Reported: April 19, 2010 2:34 PM   HGBA1C: 6.1%   (Normal Range: Non-Diabetic - 3-6%   Control Diabetic - 6-8%)  Comments: ...............test performed by......Marland KitchenBonnie A. [place], MLS (ASCP)cm       Medication Administration  Injection # 1:    Medication: Vit B12 1000 mcg    Diagnosis: ANEMIA, PERNICIOUS (ICD-281.0)    Route: IM    Site: R deltoid    Exp Date: 10/25/2011    Lot #: [id number]    Mfr: [employer]    Patient tolerated injection without complications    Given by: Arlyss Repress CMA, (April 19, 2010 2:41 PM)  Orders Added: 1)  [facility]C-[facility] [83036] 2)  Vit B12 1000 mcg [J3420] 3)  [facility]- Est  Level 4 [15176]

## 2010-07-26 NOTE — Progress Notes (Signed)
Summary: wants mri   Phone Note Call from Patient Call back at Home Phone [phone]   Caller: Patient Summary of Call: pt needs an appt for MRI on her back- went to [facility] on 8/26  Initial call taken by: De Nurse,  March 02, 2010 3:43 PM  Follow-up for Phone Call        pt is still wanting MRI Follow-up by: De Nurse,  March 08, 2010 8:50 AM  Additional Follow-up for Phone Call Additional follow up Details #1::        I reviewed the ER report from 02/23/10 when she was evaluated for hitting her head and back when the wheelchair that her son was pushing collapsed. They CT'd head and neck and there was a question of need to MRI c-spine, but her discomfort is in her back. I asked her to schedule in [place] [facility] so that we can do a neuro exam and decide if C and lumbar spine should be studied and what we will do with the result.  Additional Follow-up by: Zachery Dauer MD,  March 08, 2010 10:36 AM

## 2010-07-26 NOTE — Progress Notes (Signed)
Summary: phn msg   Phone Note Other Incoming Call back at [phone]    Caller: Santina Evans - [facility] - DM supplies Summary of Call: Ref# [id number] needs to know how often she is testing Initial call taken by: De Nurse,  August 24, 2009 4:03 PM  Follow-up for Phone Call        Once daily Follow-up by: Zachery Dauer MD,  August 24, 2009 5:53 PM     Appended Document: phn msg called and informed. spoke with rebecca.

## 2010-07-26 NOTE — Progress Notes (Signed)
Summary: phn msg   Phone Note Call from Patient   Caller: Patient Summary of Call: pt's daughter is sick and cannot bring her today Initial call taken by: De Nurse,  December 31, 2009 10:23 AM

## 2010-07-28 NOTE — Miscellaneous (Signed)
Summary: Form signed for [employer]  Form for glucometer strips to test once daily. Machine not noted on the form.

## 2010-07-28 NOTE — Miscellaneous (Signed)
Summary: Form for test strips, lancets [facility] declined  Faxed back that patient already getting from another provider. [facility] [place], [place]

## 2010-07-28 NOTE — Letter (Signed)
Summary: Results Follow-up Letter  [facility]  [address]   [place], [place] [address]   Phone: [phone]  Fax: [phone]    07/21/2010  [address]  [name], [place]  [address]  Dear Ms. Lucking,   The following are the results of your recent test(s): Patient: [name] [name] Note: All result statuses are Final unless otherwise noted.  Tests: (1) BMP with Estimated GFR (2404)   Sodium                    140 mEq/L                   135-145   Potassium                 4.9 mEq/L                   3.5-5.3   Chloride                  99 mEq/L                    96-112   CO2                       26 mEq/L                    19-32   Glucose              [H]  135 mg/dL                   02-72   BUN                  [H]  27 mg/dL                    5-36   Creatinine           [H]  1.30 mg/dL                  0.40-1.20   Calcium                   9.9 mg/dL                   6.4-40.3 ! Est GFR, African American                        [L]  48 mL/min                   >60 ! Est GFR, NonAfrican American                        [L]  40 mL/min                   >60 Your kidney function hasn't gotten any worse.  Tests: (2) CBC NO Diff (Complete Blood Count) (10000)   WBC                       7.3 K/uL                    4.0-10.5   RBC  4.03 MIL/uL                 3.87-5.11   Hemoglobin                12.0 g/dL                   04.5-40.9   Hematocrit                37.8 %                      36.0-46.0   MCV                       93.8 fL                     78.0-100.0 ! MCH                       29.8 pg                     26.0-34.0   MCHC                      31.7 g/dL                   81.1-91.4   RDW                  [H]  16.5 %                      11.5-15.5   Platelet Count            181 K/uL                    150-400 No anemia Tests: (3) Uric Acid (78295)   Uric Acid                 3.9 mg/dL                    6.2-1.3  The Allopurinol is keeping your uric acid low, so you shouldn't get gout attacks. You can stop taking the Colcrys (colchicine).   Tests: (4) Vitamin D (25-Hydroxy) (08657)  Vitamin D (25-Hydroxy)                             36 ng/mL                    30-89     This assay accurately quantifies Vitamin D  Your Vitamin D is high enough now, but you should take 1000 or 2000 International Units daily to keep it up. This plus exercise will increase your strength.   Sincerely,  Zachery Dauer MD [facility]           Appended Document: medications update Medications Added COLCRYS 0.6 MG TABS (COLCHICINE) Take one tab daily as needed VITAMIN D 1000 UNIT TABS (CHOLECALCIFEROL) Take one tablet daily          Clinical Lists Changes  Medications: Changed medication from COLCRYS 0.6 MG TABS (COLCHICINE) Take one tab daily to COLCRYS 0.6 MG TABS (COLCHICINE) Take one tab daily as needed Changed medication from ERGOCALCIFEROL 50000 UNIT CAPS (ERGOCALCIFEROL) take one tablet weeekly for 8 weeks to VITAMIN D 1000  UNIT TABS (CHOLECALCIFEROL) Take one tablet daily

## 2010-07-28 NOTE — Assessment & Plan Note (Signed)
Summary: F/U back pain [name] [name]   Vital Signs:  Patient profile:   77 year old female Height:      61 inches Weight:      212.5 pounds BMI:     40.30 Temp:     98.1 degrees F Pulse rate:   84 / minute BP sitting:   116 / 60  (right arm) Cuff size:   regular  Vitals Entered By: Arlyss Repress CMA, (June 07, 2010 3:08 PM) CC: f/up DM Is Patient Diabetic? Yes Pain Assessment Patient in pain? no        Primary Care Nyeem Stoke:  Zachery Dauer MD  CC:  f/up DM.  History of Present Illness: She continues back pain. Hasn't been very active.   Would like more medicine for the urinary incontinence. Oxybutinin 5 mg is not controlling it. Does have dry mouth on current dose  Received a reminder about mammogram and plans to schedule it.   Habits & Providers  Alcohol-Tobacco-Diet     Tobacco Status: never  Current Medications (verified): 1)  Bayer Aspirin 325 Mg Tabs (Aspirin) .... Take 1 Tablet By Mouth Once A Day 2)  Furosemide 40 Mg Tabs (Furosemide) .... Take 1 Tablet By Mouth Once A Day 3)  Glipizide 2.5 Mg Xr24h-Tab (Glipizide) .... Take One Tablet Daily 4)  Simvastatin 20 Mg Tabs (Simvastatin) .... Take 1 Tablet By Mouth At Bedtime 5)  Toprol Xl 50 Mg  Tb24 (Metoprolol Succinate) .... One Daily 6)  Colchicine 0.6 Mg  Tabs (Colchicine) .... Two Times A Day As Needed For Gouty Attacks 7)  Celexa 40 Mg Tabs (Citalopram Hydrobromide) .... One Daily 8)  Allopurinol 100 Mg Tabs (Allopurinol) .... Take 2 Tabs Daily 9)  Omeprazole 20 Mg  Tbec (Omeprazole) .... One Daily 10)  Metformin Hcl 500 Mg  Tabs (Metformin Hcl) .... Take 2 Tabs in Am and One Tab in P.m. 11)  Vitamin B-12 Cr 1500 Mcg  Tbcr (Cyanocobalamin) .... Monthly 12)  Calcium 500/d 500-200 Mg-Unit Tabs (Calcium Carbonate-Vitamin D) .... One By Mouth Three Times A Day 13)  Fish Oil 1000 Mg Caps (Omega-3 Fatty Acids) .... Ran Out 14)  Fluticasone Propionate 50 Mcg/act Susp (Fluticasone Propionate) .... Two Sprays Each Nostil  Once Daily 15)  Lorazepam 0.5 Mg Tabs (Lorazepam) .... One Two Times A Day As Needed For Anxiety 16)  Hydrocortisone 2.5 % Crea (Hydrocortisone) .... Apply Bid 17)  Oxybutynin Chloride 5 Mg Xr24h-Tab (Oxybutynin Chloride) .... Take Two  Tablet Daily 18)  Oxycodone Hcl 5 Mg Tabs (Oxycodone Hcl) .... Take One To Two Tabs Every 4 Hour As Needed Pain 19)  Lyrica 150 Mg Caps (Pregabalin) .... Once Daily 20)  Ergocalciferol 50000 Unit Caps (Ergocalciferol) .... Take One Tablet Weeekly For 8 Weeks 21)  Alendronate Sodium 70 Mg Tabs (Alendronate Sodium) .... One Tablet Weekly 22)  Oxycontin 20 Mg Xr12h-Tab (Oxycodone Hcl) .... Take One Tablet Twice A Day For Pain 23)  Senokot 8.6 Mg Tabs (Sennosides) .... Oone To Two Tabs Daily For Constipation  Allergies (verified): 1)  ! Enalapril Maleate (Enalapril Maleate) 2)  Remeron (Mirtazapine) 3)  Cephalexin  Physical Exam  General:  overweight-appearing.  alert.   Lungs:  Normal respiratory effort, chest expands symmetrically. Lungs are clear to auscultation, no crackles or wheezes. Heart:  Normal rate and regular rhythm. S1 and S2 normal without gallop, murmur, click, rub or other extra sounds. Msk:  Stands using walker. No acute back pain evidenced.  Extremities:  trace bilateral pedal  edema.   Psych:  Cognition and judgment appear intact. Alert and cooperative with normal attention span and concentration. No apparent delusions, illusions, hallucinations  Diabetes Management Exam:    Foot Exam (with socks and/or shoes not present):       Sensory-Pinprick/Light touch:          Left medial foot (L-4): normal          Left dorsal foot (L-5): normal          Left lateral foot (S-1): normal          Right medial foot (L-4): normal          Right dorsal foot (L-5): normal          Right lateral foot (S-1): normal       Sensory-Monofilament:          Left foot: normal          Right foot: normal       Sensory-other: bunion with overriding 2nd toe,  on L       Inspection:          Left foot: normal          Right foot: normal       Nails:          Left foot: normal          Right foot: normal   Impression & Recommendations:  Problem # 1:  OSTEOARTHRITIS OF SPINE, NOS (ICD-721.90) Assessment Unchanged  Problem # 2:  ACCIDENTAL FALLS, RECURRENT (ICD-E888.9) No further falls  Problem # 3:  ANEMIA, PERNICIOUS (ICD-281.0)  Her updated medication list for this problem includes:    Vitamin B-12 Cr 1500 Mcg Tbcr (Cyanocobalamin) ..... Monthly  Orders: Vit B12 1000 mcg (J3420)  Problem # 4:  DIABETES MELLITUS, TYPE II, CONTROLLED, W/NEURO COMPS (ICD-250.60)  Her updated medication list for this problem includes:    Bayer Aspirin 325 Mg Tabs (Aspirin) .Marland Kitchen... Take 1 tablet by mouth once a day    Glipizide 2.5 Mg Xr24h-tab (Glipizide) .Marland Kitchen... Take one tablet daily    Metformin Hcl 500 Mg Tabs (Metformin hcl) .Marland Kitchen... Take 2 tabs in am and one tab in p.m.  Orders: [facility]- Est  Level 4 (99214)  Problem # 5:  RESTLESS LEGS SYNDROME (ICD-333.94) This and neuropathy improved on Lyrica  Problem # 6:  HYPERLIPIDEMIA (ICD-272.4)  Her updated medication list for this problem includes:    Simvastatin 20 Mg Tabs (Simvastatin) .Marland Kitchen... Take 1 tablet by mouth at bedtime  Problem # 7:  OBESITY, NOS (ICD-278.00) Assessment: Deteriorated  Orders: [facility]- Est  Level 4 (16109)  Complete Medication List: 1)  Bayer Aspirin 325 Mg Tabs (Aspirin) .... Take 1 tablet by mouth once a day 2)  Furosemide 40 Mg Tabs (Furosemide) .... Take 1 tablet by mouth once a day 3)  Glipizide 2.5 Mg Xr24h-tab (Glipizide) .... Take one tablet daily 4)  Simvastatin 20 Mg Tabs (Simvastatin) .... Take 1 tablet by mouth at bedtime 5)  Toprol Xl 50 Mg Tb24 (Metoprolol succinate) .... One daily 6)  Colchicine 0.6 Mg Tabs (Colchicine) .... Two times a day as needed for gouty attacks 7)  Celexa 40 Mg Tabs (Citalopram hydrobromide) .... One daily 8)  Allopurinol 100 Mg Tabs  (Allopurinol) .... Take 2 tabs daily 9)  Omeprazole 20 Mg Tbec (Omeprazole) .... One daily 10)  Metformin Hcl 500 Mg Tabs (Metformin hcl) .... Take 2 tabs in am and one tab in p.m. 11)  Vitamin B-12 Cr 1500 Mcg Tbcr (Cyanocobalamin) .... Monthly 12)  Calcium 500/d 500-200 Mg-unit Tabs (Calcium carbonate-vitamin d) .... One by mouth three times a day 13)  Fish Oil 1000 Mg Caps (Omega-3 fatty acids) .... Ran out 14)  Fluticasone Propionate 50 Mcg/act Susp (Fluticasone propionate) .... Two sprays each nostil once daily 15)  Lorazepam 0.5 Mg Tabs (Lorazepam) .... One two times a day as needed for anxiety 16)  Hydrocortisone 2.5 % Crea (Hydrocortisone) .... Apply bid 17)  Oxybutynin Chloride 5 Mg Xr24h-tab (Oxybutynin chloride) .... Take two  tablet daily 18)  Oxycodone Hcl 5 Mg Tabs (Oxycodone hcl) .... Take one to two tabs every 4 hour as needed pain 19)  Lyrica 150 Mg Caps (Pregabalin) .... Once daily 20)  Ergocalciferol 50000 Unit Caps (Ergocalciferol) .... Take one tablet weeekly for 8 weeks 21)  Alendronate Sodium 70 Mg Tabs (Alendronate sodium) .... One tablet weekly 22)  Oxycontin 20 Mg Xr12h-tab (Oxycodone hcl) .... Take one tablet twice a day for pain 23)  Senokot 8.6 Mg Tabs (Sennosides) .... Oone to two tabs daily for constipation  Patient Instructions: 1)  Call to let me know how the Oxybutynin is helping your bladder. 2)  Avoid eating too much over the holidays 3)  Please schedule a follow-up appointment in 1 month.    Medication Administration  Injection # 1:    Medication: Vit B12 1000 mcg    Diagnosis: ANEMIA, PERNICIOUS (ICD-281.0)    Route: IM    Site: R deltoid    Exp Date: 01/25/2012    Lot #: [id number]    Mfr: [employer]    Patient tolerated injection without complications    Given by: Tessie Fass CMA (June 07, 2010 4:20 PM)  Orders Added: 1)  Vit B12 1000 mcg [J3420] 2)  [facility]- Est  Level 4 [13086]      Prevention & Chronic  Care Immunizations   Influenza vaccine: Fluvax MCR  (03/31/2010)   Influenza vaccine due: 03/23/2009    Tetanus booster: 08/24/2001: Done.   Tetanus booster due: 08/25/2011    Pneumococcal vaccine: Done.  (12/24/2005)   Pneumococcal vaccine due: None    H. zoster vaccine: 10/10/2007: Zostavax  Colorectal Screening   Hemoccult: Done.  (06/27/1999)   Hemoccult due: Not Indicated    Colonoscopy: normal  (02/04/2007)   Colonoscopy due: 02/03/2017  Other Screening   Pap smear: Done.  (06/27/1999)   Pap smear due: Not Indicated    Mammogram: ASSESSMENT: Benign - BI-RADS 2^MM DIGITAL SCREENING  (02/11/2009)   Mammogram due: 02/11/2010    DXA bone density scan: Hip Total: T Score -2.5 to -1.0 Hip.  -1.8 right wrist done due to sclerosis in lumbar spine T score -2.8    (03/09/2008)   DXA scan due: 03/09/2010    Smoking status: never  (06/07/2010)  Diabetes Mellitus   HgbA1C: 6.1  (04/19/2010)   Hemoglobin A1C due: 10/09/2007    Eye exam: Results: Normal. Location: [place]  (01/13/2009)   Eye exam due: 01/13/2010    Foot exam: yes  (06/07/2010)   High risk foot: Not documented   Foot care education: completed  (12/19/2007)   Foot exam due: 10/09/2008    Urine microalbumin/creatinine ratio: Not documented   Urine microalbumin action/deferral: Not indicated    Diabetes flowsheet reviewed?: Yes   Progress toward A1C goal: At goal  Lipids   Total Cholesterol: 143  (09/07/2009)   LDL: 69  (09/07/2009)   LDL Direct: 94  (02/23/2009)  HDL: 46  (09/07/2009)   Triglycerides: 139  (09/07/2009)    SGOT (AST): 18  (05/11/2009)   SGPT (ALT): 12  (05/11/2009)   Alkaline phosphatase: 101  (05/11/2009)   Total bilirubin: 0.3  (05/11/2009)    Lipid flowsheet reviewed?: Yes   Progress toward LDL goal: At goal  Hypertension   Last Blood Pressure: 116 / 60  (06/07/2010)   Serum creatinine: 1.45  (03/10/2010)   Serum potassium 4.7  (03/10/2010)    Hypertension flowsheet  reviewed?: Yes   Progress toward BP goal: At goal  Self-Management Support :   Personal Goals (by the next clinic visit) :     Personal A1C goal: 7  (04/06/2009)     Personal blood pressure goal: 130/80  (04/06/2009)     Personal LDL goal: 100  (04/06/2009)    Diabetes self-management support: Copy of home glucose meter record, Written self-care plan  (02/23/2009)    Hypertension self-management support: Written self-care plan  (02/23/2009)    Lipid self-management support: Written self-care plan  (02/23/2009)

## 2010-07-28 NOTE — Assessment & Plan Note (Signed)
Summary: f/up DM/TS   Vital Signs:  Patient profile:   77 year old female Height:      61 inches Weight:      213.3 pounds BMI:     40.45 Pulse rate:   76 / minute BP sitting:   113 / 69  (right arm)  Vitals Entered By: Arlyss Repress CMA, (July 19, 2010 4:07 PM) CC: f/up DM. B12 shot. discuss oxycontin.  Is Patient Diabetic? Yes Pain Assessment Patient in pain? yes     Location: back Intensity: 5 Onset of pain  Chronic   Primary Care Provider:  Zachery Dauer MD  CC:  f/up DM. B12 shot. discuss oxycontin. Marland Kitchen  History of Present Illness: She continues back pain. Hasn't been very active. Walks in the house using her walker.   for the urinary incontinence is taking Oxybutinin 5 mg 2 tabs daily.   Hasn't yet scheduled her mammogram  Only took part of the 50K vitamin D due to the cost  Habits & Providers  Alcohol-Tobacco-Diet     Tobacco Status: never  Current Medications (verified): 1)  Bayer Aspirin 325 Mg Tabs (Aspirin) .... Take 1 Tablet By Mouth Once A Day 2)  Furosemide 40 Mg Tabs (Furosemide) .... Take 1 Tablet By Mouth Once A Day 3)  Glipizide 2.5 Mg Xr24h-Tab (Glipizide) .... Take One Tablet Daily 4)  Simvastatin 20 Mg Tabs (Simvastatin) .... Take 1 Tablet By Mouth At Bedtime 5)  Metoprolol Succinate 25 Mg Xr24h-Tab (Metoprolol Succinate) .... Take One Tablet Daily 6)  Colcrys 0.6 Mg Tabs (Colchicine) .... Take One Tab Daily 7)  Celexa 40 Mg Tabs (Citalopram Hydrobromide) .... One Daily 8)  Allopurinol 100 Mg Tabs (Allopurinol) .... Take 2 Tabs Daily 9)  Omeprazole 20 Mg  Tbec (Omeprazole) .... One Daily 10)  Metformin Hcl 500 Mg  Tabs (Metformin Hcl) .... Take 2 Tabs in Am and One Tab in P.m. 11)  Vitamin B-12 Cr 1500 Mcg  Tbcr (Cyanocobalamin) .... Monthly 12)  Calcium 500/d 500-200 Mg-Unit Tabs (Calcium Carbonate-Vitamin D) .... One By Mouth Three Times A Day 13)  Fish Oil 1000 Mg Caps (Omega-3 Fatty Acids) .... Ran Out 14)  Fluticasone Propionate 50  Mcg/act Susp (Fluticasone Propionate) .... Two Sprays Each Nostil Once Daily 15)  Hydrocortisone 2.5 % Crea (Hydrocortisone) .... Apply Bid 16)  Oxybutynin Chloride 5 Mg Xr24h-Tab (Oxybutynin Chloride) .... Take Two  Tablet Daily 17)  Oxycodone Hcl 5 Mg Tabs (Oxycodone Hcl) .... Take One To Two Tabs Every 4 Hour As Needed Pain 18)  Lyrica 150 Mg Caps (Pregabalin) .... Once Daily 19)  Ergocalciferol 50000 Unit Caps (Ergocalciferol) .... Take One Tablet Weeekly For 8 Weeks 20)  Alendronate Sodium 70 Mg Tabs (Alendronate Sodium) .... One Tablet Weekly 21)  Oxycontin 20 Mg Xr12h-Tab (Oxycodone Hcl) .... Take One Tablet Twice A Day For Pain 22)  Senokot 8.6 Mg Tabs (Sennosides) .... Oone To Two Tabs Daily For Constipation  Allergies (verified): 1)  ! Enalapril Maleate (Enalapril Maleate) 2)  Remeron (Mirtazapine) 3)  Cephalexin  Physical Exam  General:  overweight-appearing.  alert.   Lungs:  Normal respiratory effort, chest expands symmetrically. Lungs are clear to auscultation, no crackles or wheezes. Heart:  Normal rate and regular rhythm. S1 and S2 normal without gallop, murmur, click, rub or other extra sounds. Extremities:  trace bilateral pedal edema.   Psych:  Cognition and judgment appear intact. Alert and cooperative with normal attention span and concentration.    Impression & Recommendations:  Problem # 1:  DIABETES MELLITUS, TYPE II, CONTROLLED, W/NEURO COMPS (ICD-250.60)  Her updated medication list for this problem includes:    Bayer Aspirin 325 Mg Tabs (Aspirin) .Marland Kitchen... Take 1 tablet by mouth once a day    Glipizide 2.5 Mg Xr24h-tab (Glipizide) .Marland Kitchen... Take one tablet daily    Metformin Hcl 500 Mg Tabs (Metformin hcl) .Marland Kitchen... Take 2 tabs in am and one tab in p.m.  Orders: A1C-[facility] (16109) [facility]- Est  Level 4 (60454)  Problem # 2:  SPINAL STENOSIS, LUMBAR (ICD-724.02) Assessment: Unchanged  Orders: [facility]- Est  Level 4 (09811)  Problem # 3:  RENAL DISEASE, CHRONIC, STAGE III  (ICD-585.3)  Orders: Basic Met-[facility] (91478-29562)  Problem # 4:  CHRONIC GOUTY ARTHROPATHY W/O MENTION TOPHUS (ICD-274.02)  Her updated medication list for this problem includes:    Colcrys 0.6 Mg Tabs (Colchicine) .Marland Kitchen... Take one tab daily    Allopurinol 100 Mg Tabs (Allopurinol) .Marland Kitchen... Take 2 tabs daily  Orders: Uric Acid-[facility] (13086-57846) [facility]- Est  Level 4 (96295)  Problem # 5:  HYPERTENSION, BENIGN SYSTEMIC (ICD-401.1)  Her updated medication list for this problem includes:    Furosemide 40 Mg Tabs (Furosemide) .Marland Kitchen... Take 1 tablet by mouth once a day    Metoprolol Succinate 25 Mg Xr24h-tab (Metoprolol succinate) .Marland Kitchen... Take one tablet daily  Orders: Basic Met-[facility] (28413-24401) [facility]- Est  Level 4 (02725)  Problem # 6:  ANEMIA, PERNICIOUS (ICD-281.0)  Her updated medication list for this problem includes:    Vitamin B-12 Cr 1500 Mcg Tbcr (Cyanocobalamin) ..... Monthly  Orders: Vit B12 1000 mcg (J3420) CBC-[facility] (85027) [facility]- Est  Level 4 (99214)  Problem # 7:  OBESITY, NOS (ICD-278.00) Assessment: Unchanged  Orders: [facility]- Est  Level 4 (36644)  Problem # 8:  DEPRESSIVE DISORDER, NOS (ICD-311) Assessment: Improved  The following medications were removed from the medication list:    Lorazepam 0.5 Mg Tabs (Lorazepam) ..... One two times a day as needed for anxiety Her updated medication list for this problem includes:    Celexa 40 Mg Tabs (Citalopram hydrobromide) ..... One daily  Problem # 9:  UNSPECIFIED VITAMIN D DEFICIENCY (ICD-268.9) Determine further supplementatin is indicated   Orders: Uric Acid-[facility] (03474-25956) [facility]- Est  Level 4 (38756)  Complete Medication List: 1)  Bayer Aspirin 325 Mg Tabs (Aspirin) .... Take 1 tablet by mouth once a day 2)  Furosemide 40 Mg Tabs (Furosemide) .... Take 1 tablet by mouth once a day 3)  Glipizide 2.5 Mg Xr24h-tab (Glipizide) .... Take one tablet daily 4)  Simvastatin 20 Mg Tabs (Simvastatin) .... Take 1 tablet by mouth at  bedtime 5)  Metoprolol Succinate 25 Mg Xr24h-tab (Metoprolol succinate) .... Take one tablet daily 6)  Colcrys 0.6 Mg Tabs (Colchicine) .... Take one tab daily 7)  Celexa 40 Mg Tabs (Citalopram hydrobromide) .... One daily 8)  Allopurinol 100 Mg Tabs (Allopurinol) .... Take 2 tabs daily 9)  Omeprazole 20 Mg Tbec (Omeprazole) .... One daily 10)  Metformin Hcl 500 Mg Tabs (Metformin hcl) .... Take 2 tabs in am and one tab in p.m. 11)  Vitamin B-12 Cr 1500 Mcg Tbcr (Cyanocobalamin) .... Monthly 12)  Calcium 500/d 500-200 Mg-unit Tabs (Calcium carbonate-vitamin d) .... One by mouth three times a day 13)  Fish Oil 1000 Mg Caps (Omega-3 fatty acids) .... Ran out 14)  Fluticasone Propionate 50 Mcg/act Susp (Fluticasone propionate) .... Two sprays each nostil once daily 15)  Hydrocortisone 2.5 % Crea (Hydrocortisone) .... Apply bid 16)  Oxybutynin Chloride 5 Mg Xr24h-tab (Oxybutynin chloride) .... Take two  tablet daily 17)  Oxycodone Hcl 5 Mg Tabs (Oxycodone hcl) .... Take one to two tabs every 4 hour as needed pain 18)  Lyrica 150 Mg Caps (Pregabalin) .... Once daily 19)  Ergocalciferol 50000 Unit Caps (Ergocalciferol) .... Take one tablet weeekly for 8 weeks 20)  Alendronate Sodium 70 Mg Tabs (Alendronate sodium) .... One tablet weekly 21)  Oxycontin 20 Mg Xr12h-tab (Oxycodone hcl) .... Take one tablet twice a day for pain 22)  Senokot 8.6 Mg Tabs (Sennosides) .... Oone to two tabs daily for constipation  Other Orders: Vit D, 25 OH-[facility] (16109-60454)  Patient Instructions: 1)  Please schedule a follow-up appointment in 3 months .  2)  You need to lose weight. Consider a lower calorie diet and regular exercise.  3)  Gradually increase your exercise Prescriptions: OXYCONTIN 20 MG XR12H-TAB (OXYCODONE HCL) take one tablet twice a day for pain  #60 x 0   Entered and Authorized by:   Zachery Dauer MD   Signed by:   Zachery Dauer MD on 07/19/2010   Method used:   Print then Give to Patient   RxID:    [id number] OXYCODONE HCL 5 MG TABS (OXYCODONE HCL) Take one to two tabs every 4 hour as needed pain  #120 x 0   Entered and Authorized by:   Zachery Dauer MD   Signed by:   Zachery Dauer MD on 07/19/2010   Method used:   Print then Give to Patient   RxID:   [id number] GLIPIZIDE 2.5 MG XR24H-TAB (GLIPIZIDE) Take one tablet daily  #90 x 3   Entered and Authorized by:   Zachery Dauer MD   Signed by:   Zachery Dauer MD on 07/19/2010   Method used:   Print then Give to Patient   RxID:   [id number] COLCRYS 0.6 MG TABS (COLCHICINE) Take one tab daily  #90 x 3   Entered and Authorized by:   Zachery Dauer MD   Signed by:   Zachery Dauer MD on 07/19/2010   Method used:   Print then Give to Patient   RxID:   [id number] METOPROLOL SUCCINATE 25 MG XR24H-TAB (METOPROLOL SUCCINATE) Take one tablet daily  #90 x 3   Entered and Authorized by:   Zachery Dauer MD   Signed by:   Zachery Dauer MD on 07/19/2010   Method used:   Print then Give to Patient   RxID:   [id number]    Medication Administration  Injection # 1:    Medication: Vit B12 1000 mcg    Diagnosis: ANEMIA, PERNICIOUS (ICD-281.0)    Route: IM    Site: L deltoid    Exp Date: 11/25/2011    Lot #: [id number]    Mfr: [employer]    Patient tolerated injection without complications    Given by: Arlyss Repress CMA, (July 19, 2010 4:19 PM)  Orders Added: 1)  A1C-[facility] [83036] 2)  Vit B12 1000 mcg [J3420] 3)  Basic Met-[facility] [30160-10932] 4)  Vit D, 25 [facility] [35573-22025] 5)  Uric Acid-[facility] [84550-23180] 6)  CBC-[facility] [85027] 7)  [facility]- Est  Level 4 [42706]     Medication Administration  Injection # 1:    Medication: Vit B12 1000 mcg    Diagnosis: ANEMIA, PERNICIOUS (ICD-281.0)    Route: IM    Site: L deltoid    Exp Date: 11/25/2011    Lot #: [id number]    Mfr: [employer]  Patient tolerated injection without complications    Given by: Arlyss Repress CMA, (July 19, 2010 4:19 PM)  Orders Added: 1)  A1C-[facility] [83036] 2)   Vit B12 1000 mcg [J3420] 3)  Basic Met-[facility] [16109-60454] 4)  Vit D, 25 OH-[facility] [82306-85810] 5)  Uric Acid-[facility] [84550-23180] 6)  CBC-[facility] [85027] 7)  [facility]- Est  Level 4 [99214]   Laboratory Results   Blood Tests   Date/Time Received: July 19, 2010 4:09 PM  Date/Time Reported: July 19, 2010 4:20 PM   HGBA1C: 6.7%   (Normal Range: Non-Diabetic - 3-6%   Control Diabetic - 6-8%)  Comments: ...............test performed by......Marland KitchenBonnie A. [place], MLS (ASCP)cm      Prevention & Chronic Care Immunizations   Influenza vaccine: Fluvax MCR  (03/31/2010)   Influenza vaccine due: 03/23/2009    Tetanus booster: 08/24/2001: Done.   Tetanus booster due: 08/25/2011    Pneumococcal vaccine: Done.  (12/24/2005)   Pneumococcal vaccine due: None    H. zoster vaccine: 10/10/2007: Zostavax  Colorectal Screening   Hemoccult: Done.  (06/27/1999)   Hemoccult due: Not Indicated    Colonoscopy: normal  (02/04/2007)   Colonoscopy due: 02/03/2017  Other Screening   Pap smear: Done.  (06/27/1999)   Pap smear due: Not Indicated    Mammogram: ASSESSMENT: Benign - BI-RADS 2^MM DIGITAL SCREENING  (02/11/2009)   Mammogram due: 02/11/2010    DXA bone density scan: Hip Total: T Score -2.5 to -1.0 Hip.  -1.8 right wrist done due to sclerosis in lumbar spine T score -2.8    (03/09/2008)   DXA scan due: 03/09/2010    Smoking status: never  (07/19/2010)  Diabetes Mellitus   HgbA1C: 6.7  (07/19/2010)   Hemoglobin A1C due: 10/09/2007    Eye exam: Results: Normal. Location: [place]  (01/13/2009)   Eye exam due: 01/13/2010    Foot exam: yes  (06/07/2010)   High risk foot: Not documented   Foot care education: completed  (12/19/2007)   Foot exam due: 10/09/2008    Urine microalbumin/creatinine ratio: Not documented   Urine microalbumin action/deferral: Not indicated    Diabetes flowsheet reviewed?: Yes   Progress toward A1C goal: At goal  Lipids   Total Cholesterol: 143   (09/07/2009)   LDL: 69  (09/07/2009)   LDL Direct: 94  (02/23/2009)   HDL: 46  (09/07/2009)   Triglycerides: 139  (09/07/2009)    SGOT (AST): 18  (05/11/2009)   SGPT (ALT): 12  (05/11/2009)   Alkaline phosphatase: 101  (05/11/2009)   Total bilirubin: 0.3  (05/11/2009)    Lipid flowsheet reviewed?: Yes   Progress toward LDL goal: At goal  Hypertension   Last Blood Pressure: 113 / 69  (07/19/2010)   Serum creatinine: 1.45  (03/10/2010)   Serum potassium 4.7  (03/10/2010)    Hypertension flowsheet reviewed?: Yes   Progress toward BP goal: At goal  Self-Management Support :   Personal Goals (by the next clinic visit) :     Personal A1C goal: 7  (04/06/2009)     Personal blood pressure goal: 130/80  (04/06/2009)     Personal LDL goal: 100  (04/06/2009)    Diabetes self-management support: Copy of home glucose meter record, Written self-care plan  (02/23/2009)    Hypertension self-management support: Written self-care plan  (02/23/2009)    Lipid self-management support: Written self-care plan  (02/23/2009)

## 2010-07-28 NOTE — Progress Notes (Signed)
Summary: Rx oxycodone   Phone Note Refill Request Call back at Home Phone [phone]   Refills Requested: Medication #1:  OXYCODONE HCL 5 MG TABS Take one to two tabs every 4 hour as needed pain Initial call taken by: Knox Royalty,  June 28, 2010 9:09 AM    Prescriptions: OXYCONTIN 20 MG XR12H-TAB (OXYCODONE HCL) take one tablet twice a day for pain  #60 x 0   Entered and Authorized by:   Zachery Dauer MD   Signed by:   Zachery Dauer MD on 06/28/2010   Method used:   Print then Give to Patient   RxID:   [id number] OXYCODONE HCL 5 MG TABS (OXYCODONE HCL) Take one to two tabs every 4 hour as needed pain  #120 x 0   Entered and Authorized by:   Zachery Dauer MD   Signed by:   Zachery Dauer MD on 06/28/2010   Method used:   Print then Give to Patient   RxID:   [id number] OXYCODONE HCL 5 MG TABS (OXYCODONE HCL) Take one to two tabs every 4 hour as needed pain  #120 x 0   Entered and Authorized by:   Zachery Dauer MD   Signed by:   Zachery Dauer MD on 06/28/2010   Method used:   Print then Give to Patient   RxID:   [id number]  printed and placed up front

## 2010-08-22 ENCOUNTER — Telehealth: Payer: Self-pay | Admitting: *Deleted

## 2010-08-22 NOTE — Telephone Encounter (Signed)
[name] [name], the pharmacist at [facility] called [phone] to say that pt insisted she call us. Wants her oxycodone. Last filled 07/27/10 & due 08/27/10. To pcp.Faustino Congress

## 2010-08-23 ENCOUNTER — Encounter: Payer: Self-pay | Admitting: *Deleted

## 2010-08-23 MED ORDER — OXYCODONE HCL 5 MG PO CAPS: 5.0000 mg | ORAL_CAPSULE | Freq: Two times a day (BID) | ORAL | Status: DC | PRN

## 2010-08-23 MED ORDER — OXYCODONE HCL 20 MG PO TB12: 20.0000 mg | ORAL_TABLET | Freq: Two times a day (BID) | ORAL | Status: DC

## 2010-08-23 NOTE — Progress Notes (Signed)
Called pt in re: oxycontin Rx's. Advised that the refill is given early this month and that she won't receive an early refill again. Pt agreed. Rx's up front for pick up. [name] [name]

## 2010-08-30 ENCOUNTER — Telehealth: Payer: Self-pay | Admitting: Family Medicine

## 2010-08-30 NOTE — Telephone Encounter (Signed)
Forward to [place] and Dr Sheffield Slider

## 2010-08-30 NOTE — Telephone Encounter (Signed)
Wanted to let Darl Pikes & Dr Sheffield Slider that she gets her meds & medical supplies from [facility]

## 2010-09-05 ENCOUNTER — Encounter: Payer: Self-pay | Admitting: Home Health Services

## 2010-09-06 ENCOUNTER — Encounter: Payer: Self-pay | Admitting: Family Medicine

## 2010-09-06 NOTE — Progress Notes (Signed)
  Subjective:    Patient ID: [name] [name], female    DOB: 1933-08-31, 77 y.o.   MRN: [id number]  HPI    Review of Systems     Objective:   Physical Exam        Assessment & Plan:  Form signed for [facility] for adult diapers. Marland Kitchen

## 2010-09-07 LAB — CBC
HCT: 29.6 % — ABNORMAL LOW (ref 36.0–46.0)
HCT: 30.5 % — ABNORMAL LOW (ref 36.0–46.0)
HCT: 32.9 % — ABNORMAL LOW (ref 36.0–46.0)
Hemoglobin: 10.9 g/dL — ABNORMAL LOW (ref 12.0–15.0)
Hemoglobin: 9.7 g/dL — ABNORMAL LOW (ref 12.0–15.0)
Hemoglobin: 9.7 g/dL — ABNORMAL LOW (ref 12.0–15.0)
MCH: 30.4 pg (ref 26.0–34.0)
MCHC: 32.8 g/dL (ref 30.0–36.0)
MCV: 91.6 fL (ref 78.0–100.0)
MCV: 91.9 fL (ref 78.0–100.0)
Platelets: 229 10*3/uL (ref 150–400)
RBC: 3.22 MIL/uL — ABNORMAL LOW (ref 3.87–5.11)
RBC: 3.55 MIL/uL — ABNORMAL LOW (ref 3.87–5.11)
RBC: 3.58 MIL/uL — ABNORMAL LOW (ref 3.87–5.11)
RDW: 14.5 % (ref 11.5–15.5)
WBC: 4.9 10*3/uL (ref 4.0–10.5)
WBC: 5.5 10*3/uL (ref 4.0–10.5)
WBC: 6.8 10*3/uL (ref 4.0–10.5)
WBC: 8 10*3/uL (ref 4.0–10.5)

## 2010-09-07 LAB — GLUCOSE, CAPILLARY
Glucose-Capillary: 102 mg/dL — ABNORMAL HIGH (ref 70–99)
Glucose-Capillary: 136 mg/dL — ABNORMAL HIGH (ref 70–99)
Glucose-Capillary: 146 mg/dL — ABNORMAL HIGH (ref 70–99)
Glucose-Capillary: 170 mg/dL — ABNORMAL HIGH (ref 70–99)
Glucose-Capillary: 76 mg/dL (ref 70–99)
Glucose-Capillary: 80 mg/dL (ref 70–99)
Glucose-Capillary: 95 mg/dL (ref 70–99)
Glucose-Capillary: 96 mg/dL (ref 70–99)

## 2010-09-07 LAB — COMPREHENSIVE METABOLIC PANEL
ALT: 13 U/L (ref 0–35)
AST: 20 U/L (ref 0–37)
AST: 21 U/L (ref 0–37)
Albumin: 2.9 g/dL — ABNORMAL LOW (ref 3.5–5.2)
Alkaline Phosphatase: 145 U/L — ABNORMAL HIGH (ref 39–117)
CO2: 26 mEq/L (ref 19–32)
Calcium: 8 mg/dL — ABNORMAL LOW (ref 8.4–10.5)
Chloride: 104 mEq/L (ref 96–112)
Creatinine, Ser: 1.05 mg/dL (ref 0.4–1.2)
GFR calc Af Amer: 59 mL/min — ABNORMAL LOW (ref >60)
GFR calc Af Amer: >60 mL/min (ref >60)
Glucose, Bld: 135 mg/dL — ABNORMAL HIGH (ref 70–99)
Potassium: 4.1 mEq/L (ref 3.5–5.1)
Potassium: 4.2 mEq/L (ref 3.5–5.1)
Sodium: 136 mEq/L (ref 135–145)
Sodium: 139 mEq/L (ref 135–145)
Total Bilirubin: 0.4 mg/dL (ref 0.3–1.2)
Total Protein: 6.4 g/dL (ref 6.0–8.3)

## 2010-09-07 LAB — BASIC METABOLIC PANEL
Calcium: 7.6 mg/dL — ABNORMAL LOW (ref 8.4–10.5)
Chloride: 108 mEq/L (ref 96–112)
GFR calc Af Amer: >60 mL/min (ref >60)
GFR calc non Af Amer: 53 mL/min — ABNORMAL LOW (ref >60)
GFR calc non Af Amer: >60 mL/min (ref >60)
Glucose, Bld: 103 mg/dL — ABNORMAL HIGH (ref 70–99)
Glucose, Bld: 83 mg/dL (ref 70–99)
Potassium: 3.8 mEq/L (ref 3.5–5.1)
Potassium: 4 mEq/L (ref 3.5–5.1)
Sodium: 138 mEq/L (ref 135–145)
Sodium: 139 mEq/L (ref 135–145)

## 2010-09-07 LAB — DIFFERENTIAL
Basophils Relative: 0 % (ref 0–1)
Eosinophils Absolute: 0 10*3/uL (ref 0.0–0.7)
Eosinophils Relative: 1 % (ref 0–5)
Lymphs Abs: 0.8 10*3/uL (ref 0.7–4.0)
Monocytes Relative: 3 % (ref 3–12)
Neutrophils Relative %: 85 % — ABNORMAL HIGH (ref 43–77)

## 2010-09-07 LAB — URINALYSIS, ROUTINE W REFLEX MICROSCOPIC
Glucose, UA: NEGATIVE mg/dL
Hgb urine dipstick: NEGATIVE
Ketones, ur: >80 mg/dL — AB
Protein, ur: NEGATIVE mg/dL
pH: 6 (ref 5.0–8.0)

## 2010-09-08 LAB — DIFFERENTIAL
Basophils Absolute: 0 10*3/uL (ref 0.0–0.1)
Basophils Relative: 0 % (ref 0–1)
Neutro Abs: 5 10*3/uL (ref 1.7–7.7)
Neutrophils Relative %: 73 % (ref 43–77)

## 2010-09-08 LAB — BASIC METABOLIC PANEL
BUN: 16 mg/dL (ref 6–23)
BUN: 19 mg/dL (ref 6–23)
Calcium: 8.4 mg/dL (ref 8.4–10.5)
Chloride: 106 mEq/L (ref 96–112)
Creatinine, Ser: 1.18 mg/dL (ref 0.4–1.2)
Creatinine, Ser: 1.29 mg/dL — ABNORMAL HIGH (ref 0.4–1.2)
GFR calc non Af Amer: 40 mL/min — ABNORMAL LOW (ref >60)
GFR calc non Af Amer: 45 mL/min — ABNORMAL LOW (ref >60)
Glucose, Bld: 125 mg/dL — ABNORMAL HIGH (ref 70–99)
Glucose, Bld: 163 mg/dL — ABNORMAL HIGH (ref 70–99)

## 2010-09-08 LAB — URINALYSIS, ROUTINE W REFLEX MICROSCOPIC
Bilirubin Urine: NEGATIVE
Nitrite: POSITIVE — AB
Specific Gravity, Urine: 1.018 (ref 1.005–1.030)
pH: 6.5 (ref 5.0–8.0)

## 2010-09-08 LAB — URINE MICROSCOPIC-ADD ON

## 2010-09-08 LAB — CBC
MCHC: 31.1 g/dL (ref 30.0–36.0)
MCHC: 31.1 g/dL (ref 30.0–36.0)
MCV: 96 fL (ref 78.0–100.0)
Platelets: 201 10*3/uL (ref 150–400)
RDW: 15.2 % (ref 11.5–15.5)
RDW: 15.2 % (ref 11.5–15.5)
WBC: 6.5 10*3/uL (ref 4.0–10.5)

## 2010-09-08 LAB — GLUCOSE, CAPILLARY
Glucose-Capillary: 120 mg/dL — ABNORMAL HIGH (ref 70–99)
Glucose-Capillary: 129 mg/dL — ABNORMAL HIGH (ref 70–99)
Glucose-Capillary: 134 mg/dL — ABNORMAL HIGH (ref 70–99)

## 2010-09-09 ENCOUNTER — Encounter: Payer: Self-pay | Admitting: Family Medicine

## 2010-09-09 DIAGNOSIS — M479 Spondylosis, unspecified: Secondary | ICD-10-CM

## 2010-09-09 LAB — GLUCOSE, CAPILLARY: Glucose-Capillary: 153 mg/dL — ABNORMAL HIGH (ref 70–99)

## 2010-09-09 NOTE — Progress Notes (Unsigned)
  Subjective:    Patient ID: [name] [name], female    DOB: [birth year], 77 y.o.   MRN: [id number]  HPI    Review of Systems     Objective:   Physical Exam        Assessment & Plan:  Form for [facility] completed for a mattress overlay due to poor mobility from lumbar spinal stenosis.

## 2010-09-09 NOTE — Assessment & Plan Note (Signed)
Subsumed under lumbar spinal stenosis

## 2010-09-22 ENCOUNTER — Telehealth: Payer: Self-pay | Admitting: Family Medicine

## 2010-09-22 MED ORDER — OXYCODONE HCL 20 MG PO TB12: 20.0000 mg | ORAL_TABLET | Freq: Two times a day (BID) | ORAL | Status: DC

## 2010-09-22 MED ORDER — OXYCODONE HCL 5 MG PO CAPS: 5.0000 mg | ORAL_CAPSULE | Freq: Two times a day (BID) | ORAL | Status: DC | PRN

## 2010-09-22 NOTE — Telephone Encounter (Signed)
Need refill on Oxycodone °

## 2010-09-22 NOTE — Telephone Encounter (Signed)
Scripts were handwritten and placed up front in sealed envelope for family to pick up

## 2010-09-29 LAB — BASIC METABOLIC PANEL
BUN: 21 mg/dL (ref 6–23)
Chloride: 102 mEq/L (ref 96–112)
GFR calc non Af Amer: 36 mL/min — ABNORMAL LOW (ref >60)
Potassium: 4.4 mEq/L (ref 3.5–5.1)
Sodium: 134 mEq/L — ABNORMAL LOW (ref 135–145)

## 2010-09-29 LAB — CBC
HCT: 30.4 % — ABNORMAL LOW (ref 36.0–46.0)
Hemoglobin: 10.3 g/dL — ABNORMAL LOW (ref 12.0–15.0)
MCV: 95.2 fL (ref 78.0–100.0)
Platelets: 176 10*3/uL (ref 150–400)
RBC: 3.2 MIL/uL — ABNORMAL LOW (ref 3.87–5.11)
WBC: 9.6 10*3/uL (ref 4.0–10.5)

## 2010-09-29 LAB — GLUCOSE, CAPILLARY

## 2010-09-30 LAB — CBC
HCT: 26.8 % — ABNORMAL LOW (ref 36.0–46.0)
HCT: 34.7 % — ABNORMAL LOW (ref 36.0–46.0)
Hemoglobin: 11.5 g/dL — ABNORMAL LOW (ref 12.0–15.0)
Hemoglobin: 9.1 g/dL — ABNORMAL LOW (ref 12.0–15.0)
MCHC: 33.2 g/dL (ref 30.0–36.0)
MCHC: 33.4 g/dL (ref 30.0–36.0)
MCV: 97.5 fL (ref 78.0–100.0)
MCV: 97.7 fL (ref 78.0–100.0)
Platelets: 197 10*3/uL (ref 150–400)
Platelets: 270 10*3/uL (ref 150–400)
RBC: 2.47 MIL/uL — ABNORMAL LOW (ref 3.87–5.11)
RDW: 15.7 % — ABNORMAL HIGH (ref 11.5–15.5)
WBC: 11.8 10*3/uL — ABNORMAL HIGH (ref 4.0–10.5)

## 2010-09-30 LAB — TYPE AND SCREEN: Antibody Screen: NEGATIVE

## 2010-09-30 LAB — BASIC METABOLIC PANEL
BUN: 24 mg/dL — ABNORMAL HIGH (ref 6–23)
CO2: 28 mEq/L (ref 19–32)
Chloride: 100 mEq/L (ref 96–112)
Chloride: 97 mEq/L (ref 96–112)
Creatinine, Ser: 1.56 mg/dL — ABNORMAL HIGH (ref 0.4–1.2)
GFR calc Af Amer: 44 mL/min — ABNORMAL LOW (ref >60)
GFR calc non Af Amer: 36 mL/min — ABNORMAL LOW (ref >60)
GFR calc non Af Amer: 36 mL/min — ABNORMAL LOW (ref >60)
Glucose, Bld: 153 mg/dL — ABNORMAL HIGH (ref 70–99)
Potassium: 4.4 mEq/L (ref 3.5–5.1)
Potassium: 4.9 mEq/L (ref 3.5–5.1)
Sodium: 134 mEq/L — ABNORMAL LOW (ref 135–145)
Sodium: 134 mEq/L — ABNORMAL LOW (ref 135–145)

## 2010-09-30 LAB — DIFFERENTIAL
Basophils Absolute: 0.1 10*3/uL (ref 0.0–0.1)
Basophils Relative: 1 % (ref 0–1)
Eosinophils Absolute: 1.9 10*3/uL — ABNORMAL HIGH (ref 0.0–0.7)
Eosinophils Relative: 17 % — ABNORMAL HIGH (ref 0–5)
Monocytes Absolute: 0.4 10*3/uL (ref 0.1–1.0)

## 2010-09-30 LAB — GLUCOSE, CAPILLARY
Glucose-Capillary: 120 mg/dL — ABNORMAL HIGH (ref 70–99)
Glucose-Capillary: 129 mg/dL — ABNORMAL HIGH (ref 70–99)
Glucose-Capillary: 131 mg/dL — ABNORMAL HIGH (ref 70–99)
Glucose-Capillary: 137 mg/dL — ABNORMAL HIGH (ref 70–99)
Glucose-Capillary: 151 mg/dL — ABNORMAL HIGH (ref 70–99)
Glucose-Capillary: 154 mg/dL — ABNORMAL HIGH (ref 70–99)
Glucose-Capillary: 162 mg/dL — ABNORMAL HIGH (ref 70–99)
Glucose-Capillary: 184 mg/dL — ABNORMAL HIGH (ref 70–99)

## 2010-09-30 LAB — URINALYSIS, ROUTINE W REFLEX MICROSCOPIC
Bilirubin Urine: NEGATIVE
Glucose, UA: NEGATIVE mg/dL
Hgb urine dipstick: NEGATIVE
Ketones, ur: NEGATIVE mg/dL
pH: 6.5 (ref 5.0–8.0)

## 2010-09-30 LAB — PROTIME-INR: Prothrombin Time: 12.3 seconds (ref 11.6–15.2)

## 2010-10-11 ENCOUNTER — Encounter: Payer: Self-pay | Admitting: Family Medicine

## 2010-10-25 ENCOUNTER — Telehealth: Payer: Self-pay | Admitting: Family Medicine

## 2010-10-25 NOTE — Telephone Encounter (Signed)
Needs refill on her Oxycodone - please call when ready °

## 2010-10-26 ENCOUNTER — Other Ambulatory Visit: Payer: Self-pay | Admitting: Family Medicine

## 2010-10-26 MED ORDER — OXYCODONE HCL 20 MG PO TB12: 20.0000 mg | ORAL_TABLET | Freq: Two times a day (BID) | ORAL | Status: DC

## 2010-10-26 MED ORDER — OXYCODONE HCL 5 MG PO CAPS: 5.0000 mg | ORAL_CAPSULE | Freq: Two times a day (BID) | ORAL | Status: DC | PRN

## 2010-10-26 NOTE — Telephone Encounter (Signed)
Handwritten prescriptions left in box to be picked-up.

## 2010-10-27 ENCOUNTER — Encounter: Payer: Self-pay | Admitting: Family Medicine

## 2010-10-27 ENCOUNTER — Ambulatory Visit (INDEPENDENT_AMBULATORY_CARE_PROVIDER_SITE_OTHER): Payer: PRIVATE HEALTH INSURANCE | Admitting: Family Medicine

## 2010-10-27 VITALS — BP 142/70 | HR 62 | Temp 97.7°F | Ht 60.0 in | Wt 224.0 lb

## 2010-10-27 DIAGNOSIS — M549 Dorsalgia, unspecified: Secondary | ICD-10-CM

## 2010-10-27 DIAGNOSIS — D510 Vitamin B12 deficiency anemia due to intrinsic factor deficiency: Secondary | ICD-10-CM

## 2010-10-27 DIAGNOSIS — E119 Type 2 diabetes mellitus without complications: Secondary | ICD-10-CM

## 2010-10-27 DIAGNOSIS — D649 Anemia, unspecified: Secondary | ICD-10-CM

## 2010-10-27 MED ORDER — GLUCOSE BLOOD VI STRP: ORAL_STRIP | Status: DC

## 2010-10-27 MED ORDER — CYANOCOBALAMIN 1000 MCG/ML IJ SOLN
1000.0000 ug | Freq: Once | INTRAMUSCULAR | Status: AC
  Administered 2010-10-27: 1000 ug via INTRAMUSCULAR

## 2010-10-27 NOTE — Progress Notes (Signed)
  Subjective:    Patient ID: [name] [name], female    DOB: 06-09-1934, 77 y.o.   MRN: [id number]  HPI 3 month follow up visit.  Has been using narcotics since osteoporotic fracture and they have controlled her back pain.  She is moving around with the assistance of a walker and denies falls.  She would like to begin physical therapy as the weather is better and she would like to get out.  She is not happy with her weight gain and asked what she can so to loose.  She is eating junk food and not walking around much.  She has urinary accidents almost daily, she feel the sensation and by the time she gets to the bathroom she has an accident.  She wears pull-ups and currently Medicaid is providing these.  She does not know what she would do without them.  She denies any acute flairs of joint pain, her right knee (s/p TKR) is improved.  Her legs are not swelling.   Review of Systems  Constitutional: Negative for fever and fatigue.  HENT: Negative for rhinorrhea.   Respiratory: Negative for shortness of breath.   Cardiovascular: Negative for chest pain and leg swelling.  Gastrointestinal: Negative for diarrhea and constipation.  Genitourinary: Positive for urgency. Negative for dysuria and difficulty urinating.  Musculoskeletal: Positive for back pain and gait problem. Negative for joint swelling and arthralgias.  Neurological: Negative for light-headedness.  Psychiatric/Behavioral: Negative for dysphoric mood.       Objective:   Physical Exam  Constitutional:       Elderly debilitated  Cardiovascular: Normal rate, regular rhythm and normal heart sounds.   Pulmonary/Chest: Effort normal and breath sounds normal.  Musculoskeletal: She exhibits no edema.       Lower back lean forward position, right knee with limited full extension and valgus deformity.  Psychiatric: She has a normal mood and affect.          Assessment & Plan:

## 2010-10-27 NOTE — Assessment & Plan Note (Signed)
Remaining functional on narcotics plan and contract.  Back pain no longer limiting factor in function.  Check opiate levels today.  Continue Vit D, calcium and bisphos.

## 2010-10-27 NOTE — Patient Instructions (Addendum)
Try to move more this will burn calories Eat less sugar, bread and meat; more vegetables and fruit Drink water all the time Monthly nurse visits for B12 injections; Dr. Sheffield Slider in 3 months.

## 2010-10-27 NOTE — Assessment & Plan Note (Signed)
Check A1C, sugars have been well controlled on oral agents.  She needs strips for Accu-check plus to check blood sugars 2-3 times per day.

## 2010-10-27 NOTE — Assessment & Plan Note (Signed)
B12 injection given today.  She will return for nurse visits monthly.

## 2010-11-02 ENCOUNTER — Ambulatory Visit
Payer: PRIVATE HEALTH INSURANCE | Attending: Family Medicine | Admitting: Rehabilitative and Restorative Service Providers"

## 2010-11-02 ENCOUNTER — Telehealth: Payer: Self-pay | Admitting: Family Medicine

## 2010-11-02 DIAGNOSIS — R293 Abnormal posture: Secondary | ICD-10-CM | POA: Insufficient documentation

## 2010-11-02 DIAGNOSIS — IMO0001 Reserved for inherently not codable concepts without codable children: Secondary | ICD-10-CM | POA: Insufficient documentation

## 2010-11-02 DIAGNOSIS — M6281 Muscle weakness (generalized): Secondary | ICD-10-CM | POA: Insufficient documentation

## 2010-11-02 DIAGNOSIS — M545 Low back pain, unspecified: Secondary | ICD-10-CM | POA: Insufficient documentation

## 2010-11-02 MED ORDER — GLUCOSE BLOOD VI STRP: ORAL_STRIP | Status: DC

## 2010-11-02 NOTE — Telephone Encounter (Signed)
States that the [employer] [facility] [facility] has not rec'd refill for her test strips.

## 2010-11-08 ENCOUNTER — Encounter: Payer: PRIVATE HEALTH INSURANCE | Admitting: Rehabilitative and Restorative Service Providers"

## 2010-11-08 NOTE — Discharge Summary (Signed)
NAMEDAWNNA, GRITZ              ACCOUNT NO.:  [id number]   MEDICAL RECORD NO.:  [id number]          PATIENT TYPE:  INP   LOCATION:  5015                         FACILITY:  [facility]   PHYSICIAN:  Leighton Roach McDiarmid, M.D.DATE OF BIRTH:  1934/06/19   DATE OF ADMISSION:  01/01/2007  DATE OF DISCHARGE:  01/21/2007                               DISCHARGE SUMMARY   PRIMARY PHYSICIAN:  Wayne A. Sheffield Slider, M.D., of [facility].   CONSULTANTS:  1. Leonides Grills, M.D., of orthopedics.  2. Krupa Friar, M.D., with gastroenterology.   PROCEDURES:  1. Upper endoscopy:  Nodular fundus, likely nonspecific, question      gastritis, question Helicobacter pylori status post biopsy to      evaluate pathology. Small hiatal hernia. Otherwise normal      esophagogastroduodenoscopy.  2. Colonoscopy:  Small internal hemorrhoids, otherwise negative      colonoscopy.   REASON FOR ADMISSION:  Left leg redness, swelling and pain.   DISCHARGE DIAGNOSES:  1. Left lower extremity cellulitis.  2. Left knee osteoarthritis exacerbation.  3. Depression.  4. Vitamin B12 deficiency.  5. Acute renal failure.  6. Hypertension.  7. Normocytic anemia.  8. Gastroesophageal reflux disease.  9. Hyperlipidemia.  10.Diabetes mellitus type 2.  11.Chronic back pain.  12.Diastolic dysfunction.   DISCHARGE MEDICATIONS:  1. Aspirin 325 mg p.o. daily.  2. Colace 200 mg p.o. daily.  3. Celexa 20 mg p.o. daily (this is a new medication. This dose may be      titrated up in the next several weeks if the patient is not having      any response).  4. Vitamin B12 injections 1000 mcg IM daily (the patient should      receive 1 additional day of vitamin B12 at this dose and then will      need injections of 1000 mcg IM every month).  5. Glipizide ER 10 mg p.o. daily  6. Lantus insulin 15 units subcutaneous every night.  7. NovoLog insulin 4 units subcutaneous with meals.  8. Ativan 0.25 mg p.o. every night.  9. Toprol 50 mg p.o. daily (this was reduced from patient's home dose      secondary to bradycardia and hypotension).  10.Nasonex 1 spray per nostril daily.  11.Prevacid 30 mg p.o. daily.  12.Senokot 2 tablets p.o. b.i.d.  13.Zocor 20 mg p.o. every night.  14.OxyIR 5 mg p.o. every four to six hours as needed for pain  15.Tylenol 1000 mg p.o. every eight hours.  16.NovoLog insulin sliding scale.  17.Enalapril 10 mg p.o. daily (this medication was held secondary to      elevated creatinine. It should be restarted once patient's      creatinine trends down).  18.Metformin 850 p.o. t.i.d. (this medication was held due to      patient's acute renal failure and concern for lactic acidosis.      Should restart once creatinine normalizes)  19.Tramadol 50 mg p.o., 2 tabs q.i.d. as needed (this was held      secondary to patient's altered mental status. She is now on  alternative pain therapies)  20.Lasix 40 mg p.o. daily (this medication was held secondary to acute      renal failure. May be restarted once creatinine normalizes)  21.Actos 45 mg p.o. daily (this medication was held due to concern      that it may have been contributing to patient's volume overload)  22.HCTZ 12.5 mg p.o. daily (this medication was held secondary to      patient's acute renal failure. May be restarted once creatinine      normalizes)   HOSPITAL COURSE:  The patient is a 77 year old female who presented with  left lower extremity redness, swelling and pain. She was seen in urgent  care and prescribed oral doxycycline. The pain in her leg continued to  worsen, and therefore, she presented to the ED. She was found to have  altered mental status and to be in ARF with a creatinine of >4.   1. Infectious. The patient had left lower extremity cellulitis which      was thought to have failed treatment with oral doxycycline. The      patient was placed on vancomycin initially during her      hospitalization with  improvement of her left lower extremity      cellulitis, and then she was transitioned to p.o. doxycycline and      completed a 7 day course. She remained afebrile, and her cellulitis      resolved. She now just has venous stasis skin changes in her left      lower extremity.   1. Musculoskeletal. As patient's cellulitis improved, she began to      complain of left knee pain as well as back pain. X-rays were done      of her left knee which showed severe osteoarthritis and effusion.      Patient had a mildly elevated uric acid level, and a grossly      elevated ESR. It was thought she may have gout, and she was started      on a brief course of steroids. Orthopedics was subsequently      consulted, and the patient's knee was tapped, and the joint fluid      did not have crystals and was consistent with osteoarthritis. She      was placed on scheduled Tylenol and OxyIR for pain control. The      patient was also followed by physical therapy and occupational      therapy during the hospitalization in order to increase her      mobility. It was determined by them that she required skilled      nursing facility placement for interim rehabilitation. The patient      also complained of back pain. The patient has a known history of      osteoarthritis in her spine. The patient has a family history of      multiple members with back issues, and therefore, the family was      concerned the patient has an acute back process that was affecting      her ability to walk and causing her knee pain. The patient had an      MRI of her lumbar spine ordered but was unable to have this test      completed at [facility]. Therefore, an outpatient MRI has been      scheduled for July 30 at 2 p.m. in an open MRI scanner at      [facility] to evaluate patient's lumbar spine.   1. Rheumatologic. Throughout the course of the patient's      hospitalization, she began to have elevated platelets on her  daily      CBCs. The patient was also found to have an ESR at 120 and elevated      LDH. The possible etiologies included patient's left lower      extremity cellulitis, polymyalgia rheumatica, malignancy or some      other inflammatory process. The patient was on a brief course of      steroids. However, her symptoms and complaints were not found to be      consistent with polymyalgia rheumatica, and therefore, steroids      were discontinued. Of note, the patient has had a low normal      vitamin B12 level of 228. Vitamin B12 deficiency can cause      thrombytosis . The patient was started on vitamin B12 injections,      and her platelets and sed rate subsequently began to trend down.      The patient will need followup for this issue as an outpatient.   1. Respiratory. The patient had a brief oxygen requiring during the      hospitalization and some expiratory wheezes. This resolved, and the      patient was stable on room air for over a week prior to discharge.      Her x-ray findings were concerning for pulmonary edema, and an      echocardiogram showed diastolic heart dysfunction. She was diuresed      with Lasix aggressively until she began having elevation in her      creatinine. Lasix was held. The patient also initially complained      of pleurisy at the beginning of her hospitalization. A CT angiogram      was done which did not show definite evidence of pulmonary      embolism. As mentioned, the patient does not have any remaining      oxygen requirement and is stable. She does have a history of sleep      apnea but is not on CPAP at night.   1. Cardiovascular. The patient had an echocardiogram completed which      showed diastolic heart dysfunction. She did develop some pulmonary      edema secondary to acute renal failure and diastolic dysfunction.      She was diuresed. Her Lasis will need to be restarted as an      outpatient once her creatinine normalizes. The patient  also has      history of hypertension. She was controlled with enalapril, HCTZ,      and metoprolol as an outpatient. Due to her elevated creatinine,      her meds were held. Metoprolol was reintroduced later at a lowere      dose. Her blood pressures on date of discharge were 139 and 149      over 68 to 71. Her enalapril should be restarted given her diabetes      and hypertension when her creatinine reaches a normal level. The      patient also has a history of hyperlipidemia. She was continued on      her Zocor during her hospitalization.   1. Endocrine. The patient has a history of diabetes mellitus type 2.      While hospitalized, her home Actos and metformin were held because  of her renal failure and volume overload. These were not restarted      prior to discharge and may be restarted once patient's creatinine      normalizes, and there is not a risk of lactic acidosis. The Actos      also may be restarted once it is determined that the patient is no      longer volume overloaded, and Actos is not thought to be      contributing to her fluid status. She is being discharged on Lantus      and sliding scale insulin, also meal coverage insulin.   1. Renal. The patient presented in acute renal failure with a      creatinine of greater than 4. This was thought to be secondary to      dehydration and NSAID use as an outpatient. Her creatinine      normalized quickly with fluids. It subsequently trended back up      slightly after patient started receiving Lasix and enalapril. These      medications were held. The patient may have some underlying renal      dysfunction given her history of diabetes. On day of discharge, her      creatinine was 1.3. She should continue to be monitored.   1. Gastrointestinal. The patient has a history of reflux and had some      problems with constipation during this hospitalization. She will be      on Protonix 40 mg daily as an outpatient. For  constipation, she is      on Senokot and Colace.   1. Hematologic. The patient was found to have a normocytic anemia with      a hemoglobin that fluctuated between 7 and 8. Multiple studies were      done to evaluate patient's anemia, including a total iron,      ferritin, haptoglobin, LDH, and TIBC. She was found to have a total      iron level of less than 10 and a ferritin level of 255. She was      transfused 2 units of packed red blood cells. Therefore, she will      not need to continue on oral iron as an outpatient. The patient's      stools were heme checked and were negative. However, given her      anemia of unknown etiology, GI was consulted. The patient had a      relatively unremarkable upper endoscopy and colonoscopy. However,      her vitamin B12 level was the low end of normal at 228, and she had      an elevated homocysteine level and elevated methylmalonic acid      level. Therefore, she was started on B12 injections and will need      to continue these as an outpatient monthly.   1. Psych. Upon questioning, the patient endorsed an excessive history      of grief and symptoms consistent with depression. She was given      resources by the chaplain for outpatient grief counseling. She was      also started on Celexa 20 mg p.o. daily which can be titrated up in      the next several weeks if patient does not have a response. The      patient also initially presented with altered mental status which      was thought to be secondary to her acute renal  failure, sleep      deprivation, dehydration and her home Ativan dose. The patient's      mental status normalized early on during her hospitalization.   1. Hyponatremia. Towards the final days of patient's hospitalization,      she began to have a sodium level which trended down slightly. It      was thought to be secondary to recent sodium bicarb and Mucomyst      protocol prior to her CT angiogram as well as volume  overload with      her known diastolic dysfunction. The patient is not symptomatic. As      her renal functional normalizes, this should be monitored.   CONDITION AT TIME OF DISCHARGE:  Fair.   LABORATORIES AT TIME OF DISCHARGE:  Sodium 130, potassium 4.7, BUN 17,  creatinine 1.3, glucose 131, calcium 8.7. ESR 94, hemoglobin 9.8,  platelets 488.   DISPOSITION:  The patient is discharged to skilled nursing facility to  further rehabilitate.   DISCHARGE FOLLOWUP:  The patient will be seen by the physician at her  nursing facility and upon discharge is to have followup with Dr. Sheffield Slider at  [facility].   FOLLOWUP ISSUES:  1. Outpatient MRI on January 23, 2007 for evaluation of patient's back.  2. Patient's creatinine.  3. Potential restarting of metformin, Enalapril, HCTZ, and Actos.  4. Vitamin B12 injections.      Lauro Franklin, MD  Electronically Signed      Leighton Roach McDiarmid, M.D.  Electronically Signed    TCB/MEDQ  D:  01/21/2007  T:  01/21/2007  Job:  [id number]

## 2010-11-08 NOTE — Op Note (Signed)
NAMENIJA, KOOPMAN              ACCOUNT NO.:  [id number]   MEDICAL RECORD NO.:  [id number]          PATIENT TYPE:  INP   LOCATION:  5015                         FACILITY:  [facility]   PHYSICIAN:  Ovetta Friar, MDDATE OF BIRTH:  April 15, 1934   DATE OF PROCEDURE:  01/11/2007  DATE OF DISCHARGE:                               OPERATIVE REPORT   PROCEDURE PERFORMED:  Colonoscopy.   INDICATIONS:  Anemia.   MEDICATIONS:  Fentanyl 75 mcg IV, Versed 6 mg IV.   FINDINGS:  Rectal exam was normal.  An adult Pentax colonoscope was  inserted into a fair prepped colon and advanced to the cecum where the  ileocecal valve was identified.  The appendiceal orifice could not be  completely identified due to stool products that could not be completely  aspirated.  On careful withdrawal of the colonoscope, no mucosal  abnormalities were seen.  Retroflexion revealed small internal  hemorrhoids, otherwise, was unremarkable.   ASSESSMENT:  Small internal hemorrhoids, otherwise, negative  colonoscopy.   PLAN:  Repeat colonoscopy in five years.      Donnarae Friar, MD  Electronically Signed     VCS/MEDQ  D:  01/11/2007  T:  01/11/2007  Job:  [id number]

## 2010-11-08 NOTE — Op Note (Signed)
[name], [name] [name]              ACCOUNT NO.:  [id number]   MEDICAL RECORD NO.:  [id number]          PATIENT TYPE:  INP   LOCATION:  5015                         FACILITY:  [facility]   PHYSICIAN:  Emmy [name], MDDATE OF BIRTH:  01/24/1934   DATE OF PROCEDURE:  01/11/2007  DATE OF DISCHARGE:                               OPERATIVE REPORT   PROCEDURE PERFORMED:  Upper endoscopy.   INDICATION:  Anemia.   MEDICATIONS:  Versed 1.5 mg IV, additional medicines given for preceding  colonoscopy.   FINDINGS:  The endoscope was inserted through the oropharynx and the  esophagus was intubated which was normal in its entirety.  ?The  endoscope was advanced into the stomach which revealed normal distal  stomach.  Retroflexion was done and revealed a nodular fundus with no  ulcers or mass lesions seen.  The cardia was normal in appearance.  There was a small sliding hiatal hernia noted.  The endoscope was  straightened and advanced into the duodenal bulb and second portion of  the duodenum, which were both normal.  The endoscope was withdrawn back  into the stomach and the fundus was looked at en fas view.  Two biopsies  were taken of this nodular fundus.  No other abnormalities were seen.   ASSESSMENT:  1. Nodular fundus - likely nonspecific, question gastritis, question      H. pylori status post biopsy to evaluate its histology.  2. Small hiatal hernia.  3. Otherwise, normal EGD.   PLAN:  1. Follow up on path.  2. Further workup of anemia per primary care team.  Due to the      patient's normocytic heme negative anemia, I see no indication to      pursue a capsule endoscopy at this time.      [name] Friar, MD  Electronically Signed     VCS/MEDQ  D:  01/11/2007  T:  01/11/2007  Job:  [id number]

## 2010-11-08 NOTE — Consult Note (Signed)
[name] [name], [name] [name]              ACCOUNT NO.:  [id number]   MEDICAL RECORD NO.:  [id number]          PATIENT TYPE:  INP   LOCATION:  5015                         FACILITY:  [facility]   PHYSICIAN:  Leonides Grills, M.D.     DATE OF BIRTH:  1933/08/11   DATE OF CONSULTATION:  01/10/2007  DATE OF DISCHARGE:                                 CONSULTATION   EXAM TIME:  1615.   The patient is a 77 year old female with a history of mild left knee  pain who was admitted to the hospital on January 01, 2007 with left shin  cellulitis and acute renal failure.  Reported fevers of 101 at home.  She was seen at urgent care as an outpatient and started on doxycycline  when she failed outpatient treatment.  While in the hospital, she has  had a marked increase in her left knee pain.  She reports knee swelling  and increased warmth, but no erythema.  She is unable to bear weight due  to pain.  Cellulitis is improved, but the knee pain remains.  Uric acid  levels were found to be elevated at 8.1.  Her seg rate was elevated at  120.   PAST MEDICAL HISTORY:  1. Significant for type 2 diabetes.  2. Hypertension.  3. Hyperlipidemia.  4. Urinary frequency.  5. Osteoarthritis.  6. GERD.   ALLERGIES:  SHE IS ALLERGIC TO PENICILLIN.   AT EXAM TIME:  VITAL SIGNS:  Stable.  Blood pressure 144/69, pulse rate  was 70, respiratory rate 20 and nonlabored, oral temperature was 98.6.   PHYSICAL EXAMINATION:  GENERAL:  Well-developed, well-nourished female  laying in bed, no acute distress.  SKIN:  Warm and dry.  She has pitting edema in the lower extremities.  Her left knee is tender to palpation and range of motion.  There is  slight increase in warmth to touch, but there is no erythema.  She is  neurovascularly intact distally.  There is no evidence that this is a  septic joint.   MOST RECENT LABS:  White count was slightly elevated at 12.7, hemoglobin  was low at 7.8, hematocrit was slightly low at 23.6, platelet  count was  elevated at 710,000.  Sodium was 137, potassium was 4.6, chloride was  101, CO2 was 30, BUN was elevated at 34, creatinine was 1.3, glucose  slightly elevated at 130.  Uric acid elevated at 8.1.  Sed rate was  elevated at 120.   PLAN:  X-ray of her left knee shows mild degenerative joint disease with  a fusion.   PROCEDURE NOTE:  Left knee arthrocentesis, had a sterile prep and drape  with Betadine.  Lidocaine 2% was infiltrated into the area with a 25  gauge needle followed by a 20 gauge needle.  A 22 gauge final needle was  introduced into the joint space.  Synovial fluid with some traumatic  blood was aspirated.  Needle was removed.  Sterile dressing was applied.  The patient tolerated the procedure well without problems.   ASSESSMENT:  Left knee pain.  Probably gout due to increased uric  acid  and increased sed rate, and hypersensitivity to touch.  Synovial fluid  for crystals are pending.  If she does have crystals in her synovial  fluid, would recommend treatment with cultrazine and prednisone.  Would  not recommend NSAIDs due to her renal issues.  Will check her labs later  on this evening.      Evlyn Kanner, P.A.      Leonides Grills, M.D.  Electronically Signed    RA/MEDQ  D:  01/10/2007  T:  01/11/2007  Job:  [id number]

## 2010-11-08 NOTE — Discharge Summary (Signed)
NAMESYANNA, REMMERT              ACCOUNT NO.:  [id number]   MEDICAL RECORD NO.:  [id number]          PATIENT TYPE:  INP   LOCATION:  5015                         FACILITY:  [facility]   PHYSICIAN:  Leighton Roach McDiarmid, M.D.DATE OF BIRTH:  [birth year]   DATE OF ADMISSION:  01/01/2007  DATE OF DISCHARGE:                               DISCHARGE SUMMARY   ADDENDUM TO PREVIOUS DISCHARGE SUMMARY:   REVISED DISCHARGE MEDICATION LIST:  1. Tylenol 1,000 mg p.o. q.8 h.  2. Ativan 0.25 mg p.o. q.h.s.  3. Glipizide ER 10 mg p.o. daily.  4. Metoprolol 50 mg p.o. daily.  5. Aspirin 325 mg p.o. daily.  6. Simvastatin 20 mg p.o. q.h.s.  7. Prevacid 30 mg p.o. daily.  8. OxyIR 5 mg p.o. q.4-6 h. p.r.n. pain.  9. Enalapril 10 mg p.o. daily (This medication was held secondary to      elevated creatinine.  This should be restarted once the patient's      creatinine returns to a normal level.)  10.Metformin 850 mg p.o. t.i.d. with food (This medication was held      secondary to the patient's acute renal failure and concern for      lactic acidosis.  This should be restarted once the patient's      creatinine normalizes.)  11.HCTZ 12.5 mg p.o. daily.  (This medication was held secondary to      the patient's acute renal failure.  It should be restarted once the      patient's creatinine stabilizes.)  12.Actos 45 mg p.o. daily.  (This medicine was held secondary to the      patient volume overload and it was thought to potentially have been      a contributing factor.)  13.Lasix 40 mg p.o. daily.  (This medication was held secondary to the      patient's elevated creatinine.  It should be restarted once the      patient's creatinine normalizes.)  14.Tramadol 50 mg, two tabs p.o. q.i.d. p.r.n. (This medication was      held secondary to the patient's altered mental status on admission.      She has been restarted on alternative pain medications.)  15.Colace 200 mg p.o. daily.  16.Nasonex 1 spray per  nostril daily.  17.Senokot 2 tabs p.o. b.i.d.  18.Celexa 20 mg p.o. daily.  19.Lantus 15 units subcutaneous q.h.s.  20.Vitamin B12 injection 1,000 mcg IM daily x1 day, then 100 mcg IM      every month.  21.Humalog insulin 4 units subcutaneous t.i.d. with meals.  22.NovoLog insulin sliding scale.      Lauro Franklin, MD  Electronically Signed      Leighton Roach McDiarmid, M.D.  Electronically Signed    TCB/MEDQ  D:  01/21/2007  T:  01/21/2007  Job:  [id number]

## 2010-11-08 NOTE — Consult Note (Signed)
NAMEWILSON, SAMPLE              ACCOUNT NO.:  [id number]   MEDICAL RECORD NO.:  [id number]          PATIENT TYPE:  INP   LOCATION:  5015                         FACILITY:  [facility]   PHYSICIAN:  Nandi Friar, MDDATE OF BIRTH:  12/16/1933   DATE OF CONSULTATION:  01/10/2007  DATE OF DISCHARGE:                                 CONSULTATION   INDICATIONS:  Anemia.   HISTORY OF PRESENT ILLNESS:  [name] [name] is a 77 year old white female  being seen at the request of Dr. Sheffield Slider in regards to anemia.  She was  admitted secondary to left lower leg cellulitis with an acute renal  failure.  She has been medically managed for that.  On admission her  hemoglobin was 10.0 and has decreased down to the 8.0 range.  She is  heme negative with iron level less than 10.  She was given IV iron  without any significant response.  She denies any rectal bleeding or  hematemesis.  She also denies any abdominal pain, nausea, vomiting or  dysphagia.  She never had a colonoscopy.   PAST MEDICAL HISTORY:  1. Morbid obesity.  2. Diabetes.  3. Hypertension.  4. Hyperlipidemia.  5. Obstructive sleep apnea.  6. GERD.   MEDICATIONS:  See medical chart.   PHYSICAL EXAMINATION:  VITALS:  Temperature 97.3, pulse 79, blood  pressure 127/59, O2 sat 97% on room air. GENERAL:  Morbidly obese.  No  acute distress.  ABDOMEN:  Soft, nontender, nondistended.  Positive  bowel sounds.   LABORATORY DATA:  Hemoglobin 7.8, white blood count 12.7, platelet count  710, BUN 34, creatinine 1.3.  Fecal occult blood test negative.   IMPRESSION:  A 77 year old white female with anemia of chronic disease  and heme-negative stool.  She never had a colonoscopy and due to the  anemia despite being heme-positive would recommend a colonoscopy as well  as possible upper endoscopy.  I discussed risks and benefits regarding  these procedures and she agrees to proceed.  Plan to do a colonoscopy  and upper endoscopy on January 11, 2007  if she has adequate colon prep  done.  We plan to use conscious sedation.  Agree with Dr. Reynold Bowen that  her anemia is likely multifactorial but since she has never had a  colonoscopy she does need to have this done to check for any colorectal  malignancies as a possible source.     Ajah Friar, MD  Electronically Signed    VCS/MEDQ  D:  01/10/2007  T:  01/11/2007  Job:  [id number]

## 2010-11-08 NOTE — H&P (Signed)
[name], [name] NO.:  [id number]   MEDICAL RECORD NO.:  [id number]          PATIENT TYPE:  INP   LOCATION:  5015                         FACILITY:  [facility]   PHYSICIAN:  Nestor Ramp, MD        DATE OF BIRTH:  07/02/1933   DATE OF ADMISSION:  01/01/2007  DATE OF DISCHARGE:                              HISTORY & PHYSICAL   Primary care physician is Dr. Zachery Dauer of the [facility] please.   CHIEF COMPLAINT:  Left leg redness, swelling and pain.   HISTORY OF PRESENT ILLNESS:  This is a 77 year old white female who  presents with left lower extremity redness, swelling and pain since  Saturday.  The patient presents with her two daughters.  She initially  was seen at Urgent Care on Saturday and prescribed doxycycline orally  for pleurisy in her leg.  She was also seen yesterday in clinic by Dr.  Leveda Anna, see routine clinic note for details.  She returns today because  she feels her leg continues to worsen despite the oral doxycycline,  which she has now been on for 3 days.  She has also been having fevers  up to 101 at home and her daughters also reports she has been more  confused than normal over the past month.  She has not been able to walk  for the last few days due to pain.   REVIEW OF SYSTEMS:  Denies chills, loss of appetite, malaise.  Denies  chest pain or discomfort.  Denies shortness of breath.  Complains of  cough.  Denies abdominal pain, diarrhea, nausea or vomiting.  Denies  dysuria.  Positive for confusion.   PAST MEDICAL HISTORY:  1. Type 2 diabetes.  2. Hypertension.  3. Hyperlipidemia.  4. History of urinary frequency and incontinence.  5. Osteoarthritis.  6. Obesity.  7. Insomnia.  8. Depressive disorder.  9. Gastroesophageal reflux disease.  10.Sleep apnea.   FAMILY HISTORY:  The patient's daughter and son have diabetes.  The  patient's mother, daughters and sister have a heart valve problem.  The  patient's  sister had an MI.   SOCIAL HISTORY:  Her sister, [name] [name], died.  The patient is widowed as of  October 1997.  The patient has custody of [name] [name] and [name] [name], who are  the children of Berle Mull.  [name] [name] died in the patient's home of  an overdose in September 2002.  Denies tobacco, alcohol or drug use.   PHYSICAL EXAM:  VITAL SIGNS:  Temperature is 99.3, pulse rate 89,  respiratory rate 20, blood pressure 107/52, and oxygen saturation of 93%  on room air.  In general, the patient is obese, lying in a hospital bed in no acute  distress, oriented to person and place but not time.  Head is normocephalic and atraumatic with no abnormalities noted.  Eyes:  Pupils are equal, round, reactive to light and accommodation.  Conjunctivae and lids are clear.  No scleral icterus.  Nose has no  external deformities and nares are without discharge.  Mouth has dry  mucous membranes  but no erythema or exudate.  Lungs have distant breath sounds due to habitus but good air movement  and no audible wheezes, rales or rhonchi.  Normal work of breathing.  Heart has distant heart sounds due to habitus but regular rate and  rhythm with 2+ dorsalis pedis pulses.  Abdomen with normoactive bowel sounds, is soft, nontender and  nondistended.  Extremities show the left lower extremity with significant area of  erythema of from ankle to mid calf with several small abrasions with  surrounding erythema.  Good distal pulses and capillary refill.  NEUROLOGIC:  The patient is oriented to person and place only but  otherwise grossly intact.  The patient is unable to cooperate fully with  neurologic exam to pain and having to just received morphine.   LABORATORY FINDINGS:  CBC reveals a white blood cell count 11.7,  hemoglobin 10.0, hematocrit 29.6, platelets 312, MCV 85.6, and absolute  neutrophil count 10.4.  Urinalysis is clear with specific gravity of  1.016, moderate hemoglobin, 100 protein and trace leukocyte  esterase;  however, micro is negative with only rare bacteria.  I-STAT shows a  sodium of 131, potassium of 5.2, chloride of 106, bicarb of 19.3, BUN of  56, creatinine of 4.5, with previous creatinine of 1.42 in April 2008,  and glucose of 188.   Chest x-ray showed small fluid with increased markings suggestive of  early CHF.   Lower extremity Dopplers were negative for DVT.   ASSESSMENT/PLAN:  In brief, this is a 77 year old white female who  presents with lower extremity cellulitis, acute renal failure and  confusion.   1. Left lower extremity cellulitis:  Will start the patient on IV      doxycycline to cover methicillin-resistant Staphylococcus aureus      for now given the patient's significant acute renal failure and      desire to avoid being vancomycin or Bactrim in this setting.  If      unimproved or clinically deteriorates, will switch to vancomycin      and dose renally.  Morphine as needed for pain.  Transition to home      dose of as pain improves.  2. Acute renal failure:  This likely prerenal due to dehydration given      that the patient has not been eating or drinking well for several      days in addition to taking nonsteroidal anti-inflammatory drug of      ibuprofen.  We will hold nephrotoxic and potentially worsening      agents including metformin, enalapril, ibuprofen and diuretics, and      give gentle IV fluid hydration.  Post renal obstruction may also be      a potential contributing factor given the patient's history of      incontinence, so we will place Foley and obtain postvoid residual.  3. Subacute delirium:  With multiple possible etiologies, medication-      induced, infection, acute renal failure, dehydration and relative      hypotension most likely.  Acute cardiac event less likely given the      duration of approximately 1 month.  Will check blood cultures and      catheterized urinalysis and urine cultures to look for signs of       infection.  Will continue Ativan to prevent withdrawal but at a      decreased dose.  Will minimize additional medications that could      cause somnolence or  worsen delirium.  4. Diabetes mellitus type 2:  Will hold metformin in the setting of      acute renal failure.  Will continue home dose of Actos and      Glucotrol XL and cover with sliding scale insulin q.a.c./h.s.  We      will continue aspirin but at 81 mg daily for now since instead of      325 mg.  Will give the patient ADA diet as tolerated.  5. Hypertension:  The patient is currently with relative hypotension      compared to her baseline.  Will hold all blood pressure medicines      for now while hydrating until pressures increase and are more      stable.  We will also hold diuretics in the setting of acute renal      failure.  6. Gastroesophageal reflux disease:  Will continue the patient's home      dose of Prevacid.  7. Hyperlipidemia:  Will continue the patient's home dose of      simvastatin.   Disposition is pending resolution and improvement of the cellulitis,  acute renal failure and delirium.      Drue Dun, M.D.  Electronically Signed     ______________________________  Nestor Ramp, MD    EE/MEDQ  D:  01/02/2007  T:  01/02/2007  Job:  [id number]   cc:   Deniece Portela A. Sheffield Slider, M.D.

## 2010-11-15 ENCOUNTER — Ambulatory Visit: Payer: PRIVATE HEALTH INSURANCE | Admitting: Rehabilitative and Restorative Service Providers"

## 2010-11-17 ENCOUNTER — Ambulatory Visit: Payer: PRIVATE HEALTH INSURANCE | Admitting: Rehabilitative and Restorative Service Providers"

## 2010-11-22 ENCOUNTER — Other Ambulatory Visit: Payer: Self-pay | Admitting: Family Medicine

## 2010-11-22 ENCOUNTER — Encounter: Payer: PRIVATE HEALTH INSURANCE | Admitting: Rehabilitative and Restorative Service Providers"

## 2010-11-22 NOTE — Telephone Encounter (Signed)
Pt is calling for oxycodone refill

## 2010-11-24 ENCOUNTER — Ambulatory Visit: Payer: PRIVATE HEALTH INSURANCE | Admitting: Rehabilitative and Restorative Service Providers"

## 2010-11-24 ENCOUNTER — Other Ambulatory Visit: Payer: Self-pay | Admitting: Family Medicine

## 2010-11-24 ENCOUNTER — Ambulatory Visit (INDEPENDENT_AMBULATORY_CARE_PROVIDER_SITE_OTHER): Payer: PRIVATE HEALTH INSURANCE | Admitting: *Deleted

## 2010-11-24 DIAGNOSIS — M48061 Spinal stenosis, lumbar region without neurogenic claudication: Secondary | ICD-10-CM

## 2010-11-24 DIAGNOSIS — D510 Vitamin B12 deficiency anemia due to intrinsic factor deficiency: Secondary | ICD-10-CM

## 2010-11-24 MED ORDER — OXYCODONE HCL 20 MG PO TB12: 20.0000 mg | ORAL_TABLET | Freq: Two times a day (BID) | ORAL | Status: DC

## 2010-11-24 MED ORDER — OXYCODONE HCL 5 MG PO CAPS: 5.0000 mg | ORAL_CAPSULE | Freq: Two times a day (BID) | ORAL | Status: DC | PRN

## 2010-11-24 MED ORDER — CYANOCOBALAMIN 1000 MCG/ML IJ SOLN: 1000.0000 ug | INTRAMUSCULAR | Status: DC

## 2010-11-24 MED ORDER — CYANOCOBALAMIN 1000 MCG/ML IJ SOLN
1000.0000 ug | INTRAMUSCULAR | Status: DC
  Administered 2010-11-24: 1000 ug via INTRAMUSCULAR

## 2010-11-24 MED ORDER — CYANOCOBALAMIN 1000 MCG/ML IJ SOLN: 1000.0000 ug | Freq: Once | INTRAMUSCULAR | Status: DC

## 2010-11-24 NOTE — Telephone Encounter (Signed)
Prescriptions written and put in call box

## 2010-11-29 ENCOUNTER — Encounter: Payer: PRIVATE HEALTH INSURANCE | Admitting: Rehabilitative and Restorative Service Providers"

## 2010-11-29 NOTE — Telephone Encounter (Signed)
Rx written on 5/31, closing encounter.

## 2010-12-01 ENCOUNTER — Ambulatory Visit
Payer: PRIVATE HEALTH INSURANCE | Attending: Family Medicine | Admitting: Rehabilitative and Restorative Service Providers"

## 2010-12-01 DIAGNOSIS — M545 Low back pain, unspecified: Secondary | ICD-10-CM | POA: Insufficient documentation

## 2010-12-01 DIAGNOSIS — IMO0001 Reserved for inherently not codable concepts without codable children: Secondary | ICD-10-CM | POA: Insufficient documentation

## 2010-12-01 DIAGNOSIS — M6281 Muscle weakness (generalized): Secondary | ICD-10-CM | POA: Insufficient documentation

## 2010-12-01 DIAGNOSIS — R293 Abnormal posture: Secondary | ICD-10-CM | POA: Insufficient documentation

## 2010-12-14 ENCOUNTER — Encounter: Payer: PRIVATE HEALTH INSURANCE | Admitting: Rehabilitative and Restorative Service Providers"

## 2010-12-15 ENCOUNTER — Ambulatory Visit: Payer: PRIVATE HEALTH INSURANCE | Admitting: Rehabilitative and Restorative Service Providers"

## 2010-12-16 ENCOUNTER — Encounter: Payer: Self-pay | Admitting: Family Medicine

## 2010-12-16 DIAGNOSIS — M48061 Spinal stenosis, lumbar region without neurogenic claudication: Secondary | ICD-10-CM

## 2010-12-16 NOTE — Assessment & Plan Note (Signed)
Physical therapy certification was signed to improve ambulation using the walker and to decrease fall risk.

## 2010-12-19 ENCOUNTER — Ambulatory Visit: Payer: PRIVATE HEALTH INSURANCE | Admitting: Physical Therapy

## 2010-12-22 ENCOUNTER — Ambulatory Visit: Payer: PRIVATE HEALTH INSURANCE

## 2010-12-27 ENCOUNTER — Telehealth: Payer: Self-pay | Admitting: Family Medicine

## 2010-12-27 NOTE — Telephone Encounter (Signed)
Pt requesting monthly refill for pain medication.

## 2010-12-29 ENCOUNTER — Telehealth: Payer: Self-pay | Admitting: *Deleted

## 2010-12-29 ENCOUNTER — Other Ambulatory Visit: Payer: Self-pay | Admitting: Family Medicine

## 2010-12-29 NOTE — Telephone Encounter (Signed)
Pt called and request refill of oxycodone. She reports that she has called several times for her refills. I told the pt that I would ask Dr.Hale and call her back, once the Rx is written. Pt agreed. Fwd. To Dr.Hale .Tonie Griffith just came back to the nurses station and reports, that pt's son is here right now to pick up Rx. Told Larita Fife that I would asked Dr.Hale .Arlyss Repress

## 2010-12-29 NOTE — Telephone Encounter (Signed)
Patient called the other day for refill of Oxycodone, but it is not ready yet.  She would like a call when it is ready.

## 2010-12-29 NOTE — Telephone Encounter (Signed)
Called the other day and requested a refill of Oxycodone but it is not ready yet.  She would like a call when it is ready.

## 2010-12-30 ENCOUNTER — Other Ambulatory Visit: Payer: Self-pay | Admitting: Family Medicine

## 2010-12-30 ENCOUNTER — Telehealth: Payer: Self-pay | Admitting: *Deleted

## 2010-12-30 DIAGNOSIS — M48061 Spinal stenosis, lumbar region without neurogenic claudication: Secondary | ICD-10-CM

## 2010-12-30 MED ORDER — OXYCODONE HCL 5 MG PO CAPS: ORAL_CAPSULE | ORAL | Status: DC

## 2010-12-30 MED ORDER — OXYCODONE HCL 20 MG PO TB12: 20.0000 mg | ORAL_TABLET | Freq: Two times a day (BID) | ORAL | Status: DC

## 2010-12-30 NOTE — Telephone Encounter (Signed)
Prescriptions hand-written and given to [facility] to call Mrs Zamorano

## 2010-12-30 NOTE — Telephone Encounter (Signed)
Called pt to pick up RX today. Up front. Lorenda Hatchet, [name]

## 2011-01-02 ENCOUNTER — Encounter: Payer: PRIVATE HEALTH INSURANCE | Admitting: Physical Therapy

## 2011-01-03 ENCOUNTER — Ambulatory Visit
Payer: PRIVATE HEALTH INSURANCE | Attending: Family Medicine | Admitting: Rehabilitative and Restorative Service Providers"

## 2011-01-03 DIAGNOSIS — M545 Low back pain, unspecified: Secondary | ICD-10-CM | POA: Insufficient documentation

## 2011-01-03 DIAGNOSIS — R293 Abnormal posture: Secondary | ICD-10-CM | POA: Insufficient documentation

## 2011-01-03 DIAGNOSIS — IMO0001 Reserved for inherently not codable concepts without codable children: Secondary | ICD-10-CM | POA: Insufficient documentation

## 2011-01-03 DIAGNOSIS — M6281 Muscle weakness (generalized): Secondary | ICD-10-CM | POA: Insufficient documentation

## 2011-01-04 ENCOUNTER — Ambulatory Visit: Payer: PRIVATE HEALTH INSURANCE | Admitting: Physical Therapy

## 2011-01-09 ENCOUNTER — Telehealth: Payer: Self-pay | Admitting: Family Medicine

## 2011-01-09 NOTE — Telephone Encounter (Signed)
Discharged from PT for not showing up.

## 2011-01-21 ENCOUNTER — Encounter: Payer: Self-pay | Admitting: Family Medicine

## 2011-01-30 ENCOUNTER — Telehealth: Payer: Self-pay | Admitting: Family Medicine

## 2011-01-30 NOTE — Telephone Encounter (Signed)
Forward to Dr Hale for refill. 

## 2011-01-30 NOTE — Telephone Encounter (Signed)
[name] [name] has called back checking to see if the RX for Oxycodone was ready.  I told her that it wasn't and she asked for a call when it is.

## 2011-01-30 NOTE — Telephone Encounter (Signed)
[name] [name] is calling in for her refill for Oxycodone, she did receive her letter and when she needs another refill she knows she will need to make an appt.  Please call her when her refill is ready to be picked up.

## 2011-01-31 ENCOUNTER — Other Ambulatory Visit: Payer: Self-pay | Admitting: Family Medicine

## 2011-01-31 DIAGNOSIS — M48061 Spinal stenosis, lumbar region without neurogenic claudication: Secondary | ICD-10-CM

## 2011-01-31 MED ORDER — OXYCODONE HCL 20 MG PO TB12: 20.0000 mg | ORAL_TABLET | Freq: Two times a day (BID) | ORAL | Status: DC

## 2011-01-31 MED ORDER — OXYCODONE HCL 5 MG PO CAPS: ORAL_CAPSULE | ORAL | Status: DC

## 2011-01-31 NOTE — Telephone Encounter (Signed)
I gave the hand-written prescriptions to her son, but emphasized that future prescriptions will only be given at office visits.  I will do an oxycodone blood test on her next visit

## 2011-03-01 ENCOUNTER — Telehealth: Payer: Self-pay | Admitting: Family Medicine

## 2011-03-01 DIAGNOSIS — M48061 Spinal stenosis, lumbar region without neurogenic claudication: Secondary | ICD-10-CM

## 2011-03-01 NOTE — Telephone Encounter (Addendum)
[name] [name] but is out of her oxycodone.  Need enough until the appt.  Prescriptions for 10 days of both medications were printed and put on [facility] for her to be called to pick up

## 2011-03-02 MED ORDER — OXYCODONE HCL 20 MG PO TB12: 20.0000 mg | ORAL_TABLET | Freq: Two times a day (BID) | ORAL | Status: DC

## 2011-03-02 MED ORDER — OXYCODONE HCL 5 MG PO CAPS: ORAL_CAPSULE | ORAL | Status: DC

## 2011-03-09 ENCOUNTER — Encounter: Payer: Self-pay | Admitting: Family Medicine

## 2011-03-09 ENCOUNTER — Ambulatory Visit (INDEPENDENT_AMBULATORY_CARE_PROVIDER_SITE_OTHER): Payer: PRIVATE HEALTH INSURANCE | Admitting: Family Medicine

## 2011-03-09 VITALS — BP 121/68 | HR 73 | Wt 233.6 lb

## 2011-03-09 DIAGNOSIS — M1A00X0 Idiopathic chronic gout, unspecified site, without tophus (tophi): Secondary | ICD-10-CM

## 2011-03-09 DIAGNOSIS — E785 Hyperlipidemia, unspecified: Secondary | ICD-10-CM

## 2011-03-09 DIAGNOSIS — N183 Chronic kidney disease, stage 3 unspecified: Secondary | ICD-10-CM

## 2011-03-09 DIAGNOSIS — D510 Vitamin B12 deficiency anemia due to intrinsic factor deficiency: Secondary | ICD-10-CM

## 2011-03-09 DIAGNOSIS — M48061 Spinal stenosis, lumbar region without neurogenic claudication: Secondary | ICD-10-CM

## 2011-03-09 DIAGNOSIS — E1149 Type 2 diabetes mellitus with other diabetic neurological complication: Secondary | ICD-10-CM

## 2011-03-09 DIAGNOSIS — Z23 Encounter for immunization: Secondary | ICD-10-CM

## 2011-03-09 DIAGNOSIS — G2581 Restless legs syndrome: Secondary | ICD-10-CM

## 2011-03-09 DIAGNOSIS — R32 Unspecified urinary incontinence: Secondary | ICD-10-CM

## 2011-03-09 DIAGNOSIS — M1A9XX0 Chronic gout, unspecified, without tophus (tophi): Secondary | ICD-10-CM

## 2011-03-09 DIAGNOSIS — E669 Obesity, unspecified: Secondary | ICD-10-CM

## 2011-03-09 MED ORDER — OXYCODONE HCL 20 MG PO TB12: 20.0000 mg | ORAL_TABLET | Freq: Two times a day (BID) | ORAL | Status: DC

## 2011-03-09 MED ORDER — METFORMIN HCL 500 MG PO TABS: 1000.0000 mg | ORAL_TABLET | Freq: Two times a day (BID) | ORAL | Status: DC

## 2011-03-09 MED ORDER — ROPINIROLE HCL 4 MG PO TABS: 4.0000 mg | ORAL_TABLET | Freq: Every day | ORAL | Status: DC

## 2011-03-09 MED ORDER — OXYCODONE HCL 5 MG PO CAPS: ORAL_CAPSULE | ORAL | Status: DC

## 2011-03-09 MED ORDER — CYANOCOBALAMIN 1000 MCG/ML IJ SOLN
1000.0000 ug | Freq: Once | INTRAMUSCULAR | Status: AC
  Administered 2011-03-09: 1000 ug via INTRAMUSCULAR

## 2011-03-09 MED ORDER — SOLIFENACIN SUCCINATE 5 MG PO TABS: 5.0000 mg | ORAL_TABLET | Freq: Every day | ORAL | Status: DC

## 2011-03-09 NOTE — Patient Instructions (Signed)
Return to see Dr Sheffield Slider in one month  Your A1c was 7.5 which is above the goal of 7.0, so increase the Metformin to 2 tablets twice daily Bring your medicines and blood sugar results to the next visit.  Stop the Ditropan bladder medicine and start Vesicare to help incontinence  The Requip was increased to 4mg  and take it after supper

## 2011-03-10 LAB — LDL CHOLESTEROL, DIRECT: Direct LDL: 87 mg/dL

## 2011-03-10 LAB — COMPREHENSIVE METABOLIC PANEL
AST: 20 U/L (ref 0–37)
BUN: 31 mg/dL — ABNORMAL HIGH (ref 6–23)
CO2: 27 mEq/L (ref 19–32)
Calcium: 9.3 mg/dL (ref 8.4–10.5)
Chloride: 97 mEq/L (ref 96–112)
Creat: 1.47 mg/dL — ABNORMAL HIGH (ref 0.50–1.10)

## 2011-03-10 MED ORDER — METFORMIN HCL 500 MG PO TABS: ORAL_TABLET | ORAL | Status: DC

## 2011-03-10 MED ORDER — FUROSEMIDE 40 MG PO TABS: 20.0000 mg | ORAL_TABLET | Freq: Every day | ORAL | Status: DC

## 2011-03-10 MED ORDER — GLIPIZIDE ER 2.5 MG PO TB24: 5.0000 mg | ORAL_TABLET | Freq: Every day | ORAL | Status: DC

## 2011-03-10 NOTE — Assessment & Plan Note (Addendum)
She continues the need for pain medication. I and her son are encouraging her to be more active. I instructed her in standing up exercises to work up to 8 reps daily. She is to take the Acetaminophen regularly.

## 2011-03-10 NOTE — Progress Notes (Signed)
  Subjective:    Patient ID: [name] [name], female    DOB: [birth year], 77 y.o.   MRN: [id number]  HPIHer fasting blood sugars have been in the 200's at times but usually lower. No hypoglycemic episodes. No infection symptoms   She had neck and shoulder pain, but that has gotten better. Her chronic back pain and left knee pain continue, max 6/10 in the evenings.   No definite gout attacks, but her left great toe gets red at times. She saw the podiatrist who ordered special shoes.   She would like to try a different medicine for urge incontinence. Her daughter improved with Vesicare. She is wearing adult diapers.   Not getting much exercise. Hasn't fallen.   Review of Systems  Constitutional: Positive for unexpected weight change.  Respiratory: Negative for cough and shortness of breath.   Cardiovascular: Negative for chest pain and leg swelling.  Genitourinary: Positive for urgency.  Musculoskeletal: Positive for back pain, arthralgias and gait problem.  Psychiatric/Behavioral: Negative for confusion and decreased concentration.       Objective:   Physical Exam  Constitutional:       Generalized obesity  Cardiovascular: Normal rate and regular rhythm.   Pulmonary/Chest: Effort normal and breath sounds normal. She has no rales.  Musculoskeletal: She exhibits no edema.       She has mild hallux valgus with slightly overriding second toe worse on left. No significant calouses.   Neurological: She is alert.       Can stand up from sitting using her arms then steadying herself with her walker. Fatigues after 5 repetitions          Assessment & Plan:

## 2011-03-10 NOTE — Progress Notes (Signed)
  Subjective:    Patient ID: [name] [name], female    DOB: Dec 11, 1933, 77 y.o.   MRN: [id number]  HPI    Review of Systems     Objective:   Physical Exam        Assessment & Plan:

## 2011-03-10 NOTE — Assessment & Plan Note (Addendum)
Creatinine higher. Will decrease dose of furosemide to 20 mg daily and recheck BMET next week. potassium is high so she is to stop bananas for now.

## 2011-03-10 NOTE — Assessment & Plan Note (Signed)
Weight is increasing. She admits to dietary indiscretions. She will try to do better with diet.

## 2011-03-10 NOTE — Assessment & Plan Note (Addendum)
Inadequate control so will increase Metformin, but check creatinine.   Creatinine above 1.4 so will have to replace Metformin. I left her a message to go back to Metformin 2 in AM and 1 in PM. Increase Glipizide to 2 tabs daily. Will recheck BMET next week.

## 2011-03-10 NOTE — Progress Notes (Signed)
Addended by: Zachery Dauer on: 03/10/2011 03:36 PM   Modules accepted: Orders

## 2011-03-10 NOTE — Assessment & Plan Note (Signed)
No indications of acute gout

## 2011-03-17 ENCOUNTER — Other Ambulatory Visit: Payer: PRIVATE HEALTH INSURANCE

## 2011-03-17 LAB — BASIC METABOLIC PANEL
Chloride: 93 mEq/L — ABNORMAL LOW (ref 96–112)
Potassium: 5.2 mEq/L (ref 3.5–5.3)
Sodium: 140 mEq/L (ref 135–145)

## 2011-03-17 NOTE — Progress Notes (Signed)
LABS DRAWN FOR BMP CNEWSOME 

## 2011-03-24 ENCOUNTER — Encounter: Payer: Self-pay | Admitting: Family Medicine

## 2011-04-10 LAB — BASIC METABOLIC PANEL
BUN: 27 — ABNORMAL HIGH
BUN: 30 — ABNORMAL HIGH
BUN: 32 — ABNORMAL HIGH
BUN: 34 — ABNORMAL HIGH
CO2: 27
CO2: 30
CO2: 30
CO2: 31
CO2: 35 — ABNORMAL HIGH
CO2: 35 — ABNORMAL HIGH
Calcium: 8.4
Calcium: 8.6
Calcium: 8.7
Calcium: 8.7
Calcium: 8.8
Calcium: 9
Calcium: 9.2
Chloride: 101
Chloride: 101
Chloride: 104
Chloride: 93 — ABNORMAL LOW
Chloride: 93 — ABNORMAL LOW
Chloride: 95 — ABNORMAL LOW
Creatinine, Ser: 1.11
Creatinine, Ser: 1.3 — ABNORMAL HIGH
Creatinine, Ser: 1.33 — ABNORMAL HIGH
Creatinine, Ser: 1.42 — ABNORMAL HIGH
GFR calc Af Amer: 37 — ABNORMAL LOW
GFR calc Af Amer: 44 — ABNORMAL LOW
GFR calc Af Amer: 47 — ABNORMAL LOW
GFR calc Af Amer: 48 — ABNORMAL LOW
GFR calc Af Amer: 54 — ABNORMAL LOW
GFR calc Af Amer: 58 — ABNORMAL LOW
GFR calc non Af Amer: 36 — ABNORMAL LOW
GFR calc non Af Amer: 38 — ABNORMAL LOW
GFR calc non Af Amer: 41 — ABNORMAL LOW
GFR calc non Af Amer: 45 — ABNORMAL LOW
GFR calc non Af Amer: 48 — ABNORMAL LOW
Glucose, Bld: 143 — ABNORMAL HIGH
Glucose, Bld: 154 — ABNORMAL HIGH
Glucose, Bld: 167 — ABNORMAL HIGH
Glucose, Bld: 197 — ABNORMAL HIGH
Glucose, Bld: 262 — ABNORMAL HIGH
Potassium: 4.4
Potassium: 4.5
Potassium: 4.7
Potassium: 4.7
Potassium: 4.8
Potassium: 5.2 — ABNORMAL HIGH
Sodium: 131 — ABNORMAL LOW
Sodium: 136
Sodium: 137
Sodium: 139
Sodium: 139
Sodium: 139

## 2011-04-10 LAB — CBC
HCT: 23.3 — ABNORMAL LOW
HCT: 24.4 — ABNORMAL LOW
HCT: 24.9 — ABNORMAL LOW
HCT: 29 — ABNORMAL LOW
HCT: 29.6 — ABNORMAL LOW
HCT: 30.2 — ABNORMAL LOW
HCT: 31.7 — ABNORMAL LOW
HCT: 32.8 — ABNORMAL LOW
HCT: 33.3 — ABNORMAL LOW
Hemoglobin: 10.1 — ABNORMAL LOW
Hemoglobin: 10.1 — ABNORMAL LOW
Hemoglobin: 10.9 — ABNORMAL LOW
Hemoglobin: 11 — ABNORMAL LOW
Hemoglobin: 7.7 — CL
Hemoglobin: 8.5 — ABNORMAL LOW
Hemoglobin: 9.8 — ABNORMAL LOW
MCHC: 33
MCHC: 33.1
MCHC: 33.1
MCHC: 33.1
MCHC: 33.2
MCHC: 33.2
MCHC: 33.2
MCHC: 33.4
MCHC: 33.9
MCV: 86.2
MCV: 86.7
MCV: 86.8
MCV: 87.2
MCV: 87.4
MCV: 87.6
MCV: 88.1
MCV: 88.1
Platelets: 565 — ABNORMAL HIGH
Platelets: 682 — ABNORMAL HIGH
Platelets: 710 — ABNORMAL HIGH
Platelets: 748 — ABNORMAL HIGH
Platelets: 855 — ABNORMAL HIGH
RBC: 2.67 — ABNORMAL LOW
RBC: 2.76 — ABNORMAL LOW
RBC: 2.83 — ABNORMAL LOW
RBC: 2.88 — ABNORMAL LOW
RBC: 2.95 — ABNORMAL LOW
RBC: 3.32 — ABNORMAL LOW
RBC: 3.46 — ABNORMAL LOW
RBC: 3.49 — ABNORMAL LOW
RBC: 3.6 — ABNORMAL LOW
RBC: 3.8 — ABNORMAL LOW
RBC: 4.01
RDW: 15.5 — ABNORMAL HIGH
RDW: 16.1 — ABNORMAL HIGH
RDW: 16.2 — ABNORMAL HIGH
RDW: 16.3 — ABNORMAL HIGH
RDW: 16.3 — ABNORMAL HIGH
RDW: 16.5 — ABNORMAL HIGH
RDW: 16.6 — ABNORMAL HIGH
WBC: 10.5
WBC: 11.7 — ABNORMAL HIGH
WBC: 12.1 — ABNORMAL HIGH
WBC: 12.8 — ABNORMAL HIGH
WBC: 16.5 — ABNORMAL HIGH
WBC: 7

## 2011-04-10 LAB — CROSSMATCH
ABO/RH(D): A NEG
Antibody Screen: NEGATIVE

## 2011-04-10 LAB — GRAM STAIN

## 2011-04-10 LAB — BODY FLUID CULTURE: Culture: NO GROWTH

## 2011-04-10 LAB — RETICULOCYTES
RBC.: 3.02 — ABNORMAL LOW
RBC.: 4
Retic Count, Absolute: 172
Retic Ct Pct: 3.5 — ABNORMAL HIGH
Retic Ct Pct: 4.3 — ABNORMAL HIGH

## 2011-04-10 LAB — OCCULT BLOOD X 1 CARD TO LAB, STOOL: Fecal Occult Bld: NEGATIVE

## 2011-04-10 LAB — COMPREHENSIVE METABOLIC PANEL
ALT: 16
ALT: 30
AST: 15
AST: 29
Alkaline Phosphatase: 71
Alkaline Phosphatase: 79
CO2: 29
CO2: 32
Calcium: 8.8
Chloride: 101
Chloride: 91 — ABNORMAL LOW
Creatinine, Ser: 1.3 — ABNORMAL HIGH
GFR calc Af Amer: 49 — ABNORMAL LOW
GFR calc non Af Amer: 40 — ABNORMAL LOW
GFR calc non Af Amer: 47 — ABNORMAL LOW
Glucose, Bld: 131 — ABNORMAL HIGH
Potassium: 3.4 — ABNORMAL LOW
Sodium: 141
Total Bilirubin: 0.4
Total Bilirubin: 0.7

## 2011-04-10 LAB — METHYLMALONIC ACID, SERUM: Methylmalonic Acid, Quantitative: 740 nmol/L — ABNORMAL HIGH (ref 87–318)

## 2011-04-10 LAB — GLUCOSE, RANDOM: Glucose, Bld: 345 — ABNORMAL HIGH

## 2011-04-10 LAB — SEDIMENTATION RATE: Sed Rate: 120 — ABNORMAL HIGH

## 2011-04-10 LAB — CULTURE, BLOOD (ROUTINE X 2)
Culture: NO GROWTH
Culture: NO GROWTH

## 2011-04-10 LAB — URINALYSIS, ROUTINE W REFLEX MICROSCOPIC
Bilirubin Urine: NEGATIVE
Glucose, UA: NEGATIVE
Hgb urine dipstick: NEGATIVE
Ketones, ur: NEGATIVE
Protein, ur: NEGATIVE
Urobilinogen, UA: 0.2

## 2011-04-10 LAB — TECHNOLOGIST SMEAR REVIEW: Path Review: INCREASED

## 2011-04-10 LAB — SYNOVIAL CELL COUNT + DIFF, W/ CRYSTALS
Eosinophils-Synovial: 0
Monocyte-Macrophage-Synovial Fluid: 5 — ABNORMAL LOW
Other Cells-SYN: 0

## 2011-04-10 LAB — RHEUMATOID FACTOR: Rhuematoid fact SerPl-aCnc: <20

## 2011-04-10 LAB — CREATININE, SERUM
Creatinine, Ser: 1.49 — ABNORMAL HIGH
GFR calc non Af Amer: 34 — ABNORMAL LOW

## 2011-04-10 LAB — FOLATE: Folate: 6.9

## 2011-04-11 ENCOUNTER — Ambulatory Visit (INDEPENDENT_AMBULATORY_CARE_PROVIDER_SITE_OTHER): Payer: PRIVATE HEALTH INSURANCE | Admitting: Family Medicine

## 2011-04-11 ENCOUNTER — Other Ambulatory Visit: Payer: Self-pay | Admitting: Family Medicine

## 2011-04-11 ENCOUNTER — Encounter: Payer: Self-pay | Admitting: Family Medicine

## 2011-04-11 VITALS — BP 135/71 | HR 76 | Temp 98.0°F | Ht 60.0 in | Wt 231.0 lb

## 2011-04-11 DIAGNOSIS — M48061 Spinal stenosis, lumbar region without neurogenic claudication: Secondary | ICD-10-CM

## 2011-04-11 DIAGNOSIS — D510 Vitamin B12 deficiency anemia due to intrinsic factor deficiency: Secondary | ICD-10-CM

## 2011-04-11 DIAGNOSIS — E1149 Type 2 diabetes mellitus with other diabetic neurological complication: Secondary | ICD-10-CM

## 2011-04-11 DIAGNOSIS — I10 Essential (primary) hypertension: Secondary | ICD-10-CM

## 2011-04-11 DIAGNOSIS — R32 Unspecified urinary incontinence: Secondary | ICD-10-CM

## 2011-04-11 LAB — RENAL FUNCTION PANEL
Albumin: 1.7 — ABNORMAL LOW
BUN: 63 — ABNORMAL HIGH
CO2: 24
CO2: 26
CO2: 30
Calcium: 8.2 — ABNORMAL LOW
Calcium: 8.2 — ABNORMAL LOW
Calcium: 8.5
Chloride: 104
Chloride: 107
Chloride: 99
Creatinine, Ser: 1.56 — ABNORMAL HIGH
Creatinine, Ser: 1.97 — ABNORMAL HIGH
Creatinine, Ser: 2.91 — ABNORMAL HIGH
GFR calc Af Amer: 40 — ABNORMAL LOW
GFR calc Af Amer: 48 — ABNORMAL LOW
GFR calc non Af Amer: 33 — ABNORMAL LOW
GFR calc non Af Amer: 39 — ABNORMAL LOW
Glucose, Bld: 143 — ABNORMAL HIGH
Glucose, Bld: 195 — ABNORMAL HIGH
Glucose, Bld: 209 — ABNORMAL HIGH
Phosphorus: 5.6 — ABNORMAL HIGH
Potassium: 3.3 — ABNORMAL LOW
Potassium: 3.8
Sodium: 134 — ABNORMAL LOW
Sodium: 138
Sodium: 139

## 2011-04-11 LAB — POCT URINALYSIS DIP (DEVICE)
Bilirubin Urine: NEGATIVE
Glucose, UA: NEGATIVE
Ketones, ur: NEGATIVE
Nitrite: NEGATIVE
Operator id: 270961

## 2011-04-11 LAB — POCT I-STAT CREATININE
Creatinine, Ser: 4.5 — ABNORMAL HIGH
Operator id: 234501

## 2011-04-11 LAB — CBC
HCT: 23.5 — ABNORMAL LOW
HCT: 24.1 — ABNORMAL LOW
HCT: 27.2 — ABNORMAL LOW
Hemoglobin: 10 — ABNORMAL LOW
Hemoglobin: 11.5 — ABNORMAL LOW
Hemoglobin: 7.7 — CL
Hemoglobin: 7.8 — CL
Hemoglobin: 8.1 — ABNORMAL LOW
Hemoglobin: 8.2 — ABNORMAL LOW
Hemoglobin: 9.1 — ABNORMAL LOW
MCHC: 33.5
MCHC: 33.5
MCHC: 33.6
MCHC: 33.7
MCV: 85.6
MCV: 85.9
MCV: 86.9
Platelets: 312
Platelets: 320
RBC: 2.67 — ABNORMAL LOW
RBC: 2.67 — ABNORMAL LOW
RBC: 2.71 — ABNORMAL LOW
RBC: 2.8 — ABNORMAL LOW
RBC: 2.82 — ABNORMAL LOW
RBC: 3.46 — ABNORMAL LOW
RDW: 15.6 — ABNORMAL HIGH
RDW: 15.9 — ABNORMAL HIGH
RDW: 16.1 — ABNORMAL HIGH
RDW: 16.5 — ABNORMAL HIGH
WBC: 10.5
WBC: 11.7 — ABNORMAL HIGH
WBC: 13.6 — ABNORMAL HIGH
WBC: 14.3 — ABNORMAL HIGH
WBC: 9.8

## 2011-04-11 LAB — BASIC METABOLIC PANEL
BUN: 68 — ABNORMAL HIGH
Calcium: 8.2 — ABNORMAL LOW
Calcium: 8.4
GFR calc Af Amer: 11 — ABNORMAL LOW
GFR calc non Af Amer: 10 — ABNORMAL LOW
GFR calc non Af Amer: 9 — ABNORMAL LOW
Glucose, Bld: 150 — ABNORMAL HIGH
Glucose, Bld: 179 — ABNORMAL HIGH
Potassium: 5.5 — ABNORMAL HIGH
Sodium: 134 — ABNORMAL LOW
Sodium: 135

## 2011-04-11 LAB — I-STAT 8, (EC8 V) (CONVERTED LAB)
Acid-base deficit: 6 — ABNORMAL HIGH
BUN: 56 — ABNORMAL HIGH
Chloride: 106
Glucose, Bld: 188 — ABNORMAL HIGH
Potassium: 5.2 — ABNORMAL HIGH
pCO2, Ven: 34.5 — ABNORMAL LOW
pH, Ven: 7.356 — ABNORMAL HIGH

## 2011-04-11 LAB — URINE CULTURE: Special Requests: NEGATIVE

## 2011-04-11 LAB — FERRITIN: Ferritin: 255 (ref 10–291)

## 2011-04-11 LAB — DIFFERENTIAL
Basophils Absolute: 0
Basophils Relative: 0
Lymphocytes Relative: 13
Lymphs Abs: 0.7
Monocytes Absolute: 0.6
Monocytes Relative: 5
Neutro Abs: 10.4 — ABNORMAL HIGH
Neutro Abs: 7.3
Neutrophils Relative %: 81 — ABNORMAL HIGH
Neutrophils Relative %: 89 — ABNORMAL HIGH

## 2011-04-11 LAB — CROSSMATCH: ABO/RH(D): A NEG

## 2011-04-11 LAB — CREATININE, URINE, RANDOM
Creatinine, Urine: 115.8
Creatinine, Urine: 98.2

## 2011-04-11 LAB — URINALYSIS, ROUTINE W REFLEX MICROSCOPIC
Bilirubin Urine: NEGATIVE
Glucose, UA: NEGATIVE
Ketones, ur: NEGATIVE
Nitrite: NEGATIVE
Specific Gravity, Urine: 1.016
Urobilinogen, UA: 0.2
pH: 5
pH: 6

## 2011-04-11 LAB — POCT CARDIAC MARKERS
Operator id: 234501
Troponin i, poc: <0.05

## 2011-04-11 LAB — PROTEIN ELECTROPH W RFLX QUANT IMMUNOGLOBULINS
Albumin ELP: 43.3 — ABNORMAL LOW
Total Protein ELP: 5.9 — ABNORMAL LOW

## 2011-04-11 LAB — IRON: Iron: <10 — ABNORMAL LOW

## 2011-04-11 LAB — IRON AND TIBC
Saturation Ratios: 38
TIBC: 338
UIBC: 209

## 2011-04-11 LAB — SODIUM, URINE, RANDOM: Sodium, Ur: 38

## 2011-04-11 LAB — CULTURE, BLOOD (ROUTINE X 2)
Culture: NO GROWTH
Culture: NO GROWTH

## 2011-04-11 LAB — B-NATRIURETIC PEPTIDE (CONVERTED LAB): Pro B Natriuretic peptide (BNP): 83

## 2011-04-11 LAB — URINE MICROSCOPIC-ADD ON

## 2011-04-11 MED ORDER — OXYCODONE HCL 5 MG PO CAPS: ORAL_CAPSULE | ORAL | Status: DC

## 2011-04-11 MED ORDER — FUROSEMIDE 20 MG PO TABS: 20.0000 mg | ORAL_TABLET | Freq: Every day | ORAL | Status: DC

## 2011-04-11 MED ORDER — CYANOCOBALAMIN 1000 MCG/ML IJ SOLN
1000.0000 ug | Freq: Once | INTRAMUSCULAR | Status: AC
  Administered 2011-04-11: 1000 ug via INTRAMUSCULAR

## 2011-04-11 MED ORDER — OXYCODONE HCL 20 MG PO TB12: 20.0000 mg | ORAL_TABLET | Freq: Two times a day (BID) | ORAL | Status: DC

## 2011-04-11 MED ORDER — METOPROLOL SUCCINATE ER 50 MG PO TB24: 50.0000 mg | ORAL_TABLET | Freq: Every day | ORAL | Status: DC

## 2011-04-11 NOTE — Assessment & Plan Note (Signed)
Mild edema so we'll continue low-dose furosemide. Asked her to continue restricting salt intake

## 2011-04-11 NOTE — Progress Notes (Signed)
  Subjective:    Patient ID: [name] [name], female    DOB: [birth year], 77 y.o.   MRN: [id number]  HPI she continues to have a left leg discomfort particularly when she lies down to go to bed at night. Taking the Lyrica and adjusting her position she is able to sleep usually 8 hours at a time. She denies numbness in the leg. He had some increased swelling so she put the Lasix 40 mg up to a full tablet daily.  Her fasting blood sugars have been in the 110-125 range. She has had device as high as 158 later in the day.  Vesicare is helping decrease her urinary urgency and incontinence.  Review of her medications she is still taking the 25 mg capsules of metoprolol.       Review of Systems  Respiratory: Negative for cough and shortness of breath.   Cardiovascular: Positive for leg swelling. Negative for chest pain.  Gastrointestinal: Negative for constipation.  Musculoskeletal: Positive for back pain and gait problem.       Objective:   Physical Exam  Constitutional:       Generalized obesity not acutely ill   Cardiovascular: Normal rate and regular rhythm.   Pulmonary/Chest: Effort normal and breath sounds normal.  Musculoskeletal: Normal range of motion. She exhibits edema.       Edema ankles trace on left 1+ on right  Neurological:       Ambulates with walker          Assessment & Plan:

## 2011-04-11 NOTE — Patient Instructions (Signed)
Continue your medications the same except that the furosemide will be the 20 mg dose one daily. This smaller dose is to make sure your kidneys are getting enough blood flow.   Return to see Dr Sheffield Slider in 2 months and we'll check your diabetes control and kidney function then.

## 2011-04-11 NOTE — Assessment & Plan Note (Signed)
I sent in metoprolol 50 mg succinate with note toreplacement 25 mg she has

## 2011-04-11 NOTE — Assessment & Plan Note (Signed)
Monthly B12 shot given

## 2011-04-11 NOTE — Assessment & Plan Note (Signed)
Continue Vesicare which has improved this

## 2011-04-11 NOTE — Telephone Encounter (Signed)
Refill request

## 2011-04-11 NOTE — Assessment & Plan Note (Signed)
Encourage increased activity. She will continue the Lyrica just at nighttime since patient is sleepy in the daytime. Refill her oxycodone and OxyContin with other refills for one month.

## 2011-04-11 NOTE — Assessment & Plan Note (Signed)
Improved control of blood sugars with no hypoglycemic episodes.  Continue medications the same and repeat A1c in 2 months.

## 2011-05-02 ENCOUNTER — Other Ambulatory Visit: Payer: Self-pay | Admitting: Family Medicine

## 2011-05-02 MED ORDER — ALLOPURINOL 100 MG PO TABS: 200.0000 mg | ORAL_TABLET | Freq: Every day | ORAL | Status: DC

## 2011-05-14 ENCOUNTER — Telehealth: Payer: Self-pay | Admitting: Family Medicine

## 2011-05-14 NOTE — Telephone Encounter (Signed)
Patient's family member asking for refill on MS Contin.  He says pharmacy has misplaced prescription.  Told him that we do not refill medications over the emergency line.  He says that patient is in pain and needs something now.  Advised them to go to Urgent Care or ED to address pain.  If it can wait, advised patient to call PCP in the morning.

## 2011-05-15 ENCOUNTER — Telehealth: Payer: Self-pay | Admitting: Family Medicine

## 2011-05-15 NOTE — Telephone Encounter (Signed)
Spoke with [facility] regarding rx Oxycotting for Ms. Crisostomo.  Was informed she had not had anything filled since February.  Was told also that patient utilizes [facility] and [facility] as well.  Called [facility] and was told that last rx for Oxycodone was filled on 11/16 and 9/13 for Oxycottin.  Called [facility] and last time rx filled there was 9/7 for Oxycottin.   Also [facility] pharmacy person stated that patient's son was also using the meds.

## 2011-05-15 NOTE — Telephone Encounter (Signed)
Patients son is calling back to see if the Rx was ready, he had extremely slurred speech.  He said that his mother had knee surgery and is in a lot of pain.

## 2011-05-16 ENCOUNTER — Encounter: Payer: Self-pay | Admitting: Family Medicine

## 2011-05-16 ENCOUNTER — Ambulatory Visit (INDEPENDENT_AMBULATORY_CARE_PROVIDER_SITE_OTHER): Payer: PRIVATE HEALTH INSURANCE | Admitting: Family Medicine

## 2011-05-16 VITALS — BP 140/72 | HR 64 | Ht 60.0 in | Wt 225.0 lb

## 2011-05-16 DIAGNOSIS — D510 Vitamin B12 deficiency anemia due to intrinsic factor deficiency: Secondary | ICD-10-CM

## 2011-05-16 DIAGNOSIS — E669 Obesity, unspecified: Secondary | ICD-10-CM

## 2011-05-16 DIAGNOSIS — M48061 Spinal stenosis, lumbar region without neurogenic claudication: Secondary | ICD-10-CM

## 2011-05-16 DIAGNOSIS — E1149 Type 2 diabetes mellitus with other diabetic neurological complication: Secondary | ICD-10-CM

## 2011-05-16 DIAGNOSIS — I10 Essential (primary) hypertension: Secondary | ICD-10-CM

## 2011-05-16 DIAGNOSIS — M1710 Unilateral primary osteoarthritis, unspecified knee: Secondary | ICD-10-CM

## 2011-05-16 MED ORDER — CYANOCOBALAMIN 1000 MCG/ML IJ SOLN
1000.0000 ug | Freq: Once | INTRAMUSCULAR | Status: AC
  Administered 2011-05-16: 1000 ug via INTRAMUSCULAR

## 2011-05-16 MED ORDER — OXYCODONE HCL 5 MG PO CAPS: ORAL_CAPSULE | ORAL | Status: DC

## 2011-05-16 MED ORDER — OXYCODONE HCL 20 MG PO TB12: 20.0000 mg | ORAL_TABLET | Freq: Two times a day (BID) | ORAL | Status: DC

## 2011-05-16 MED ORDER — FUROSEMIDE 20 MG PO TABS: ORAL_TABLET | ORAL | Status: DC
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
## 2011-05-16 NOTE — Assessment & Plan Note (Signed)
unchanged

## 2011-05-16 NOTE — Assessment & Plan Note (Signed)
Has lost some weight 

## 2011-05-16 NOTE — Assessment & Plan Note (Signed)
well controlled  

## 2011-05-16 NOTE — Progress Notes (Signed)
  Subjective:    Patient ID: [name] [name], female    DOB: March 11, 1934, 77 y.o.   MRN: [id number]  HPI she's had her oxycodone but not her OxyContin since November 16. She and her son say that the pharmacy lost the prescription for her refill to be done that day. I checked the physicians control drug database and she has been getting her medicines from different drugstores, but there is no indication that she is getting up more often than I had prescribed ut or from other prescribers. Her son says that he is taking pain medication for his back is considering having surgery sitting been discharged from the pain clinic no her he was getting his medications. They deny that he uses any of her pain medicines. Her pain is primarily in her back and left lateral leg,with some in the left knee that she had replaced.  She asked for the furosemide to be increased because she feels she's picking up fluid in her abdomen. She does note some swelling in her ankles. She has bleeding from her left dorsal foot and we now notes some on her right posterior ankle. Her nails were trimmed by the podiatrist. She notes some numbness in her feet but cannot characterize it. She continues to use ropinirole 4 restless leg symptoms.   Blood sugars fasting have been 105 - 175 given by memory by the son   Review of Systems     Objective:   Physical ExamMarked central obesity. She walks slowly forward using a walker taking wide-based slow steps. Chest clear Heart regular rhythm without murmur She did not show pain with rotation of her left hip.  Ankles 2+ edema Punctate bleeding excoriation on the left dorsal foot and on the right posterior ankle.       Assessment & Plan:

## 2011-05-16 NOTE — Assessment & Plan Note (Signed)
Will try mild increase in furosemide and check creatinine next visit.

## 2011-05-16 NOTE — Patient Instructions (Signed)
Increase your furosemide to 2 of the 20 mg tablets on Monday, Weds and Friday and one tablet the remaining days.   Return to see Dr Sheffield Slider in one month. I will do a blood test then.

## 2011-05-17 ENCOUNTER — Other Ambulatory Visit: Payer: Self-pay | Admitting: Family Medicine

## 2011-05-17 DIAGNOSIS — M48061 Spinal stenosis, lumbar region without neurogenic claudication: Secondary | ICD-10-CM

## 2011-05-17 MED ORDER — OXYCODONE HCL 20 MG PO TB12: 20.0000 mg | ORAL_TABLET | Freq: Two times a day (BID) | ORAL | Status: DC

## 2011-05-17 NOTE — Telephone Encounter (Signed)
Handwritten since printed script still had refill in one month on it.

## 2011-05-23 ENCOUNTER — Other Ambulatory Visit: Payer: Self-pay | Admitting: Family Medicine

## 2011-05-23 ENCOUNTER — Ambulatory Visit: Payer: PRIVATE HEALTH INSURANCE | Admitting: Family Medicine

## 2011-05-23 DIAGNOSIS — G2581 Restless legs syndrome: Secondary | ICD-10-CM

## 2011-05-24 NOTE — Telephone Encounter (Signed)
Refill request

## 2011-06-12 ENCOUNTER — Other Ambulatory Visit: Payer: Self-pay | Admitting: Family Medicine

## 2011-06-12 DIAGNOSIS — F329 Major depressive disorder, single episode, unspecified: Secondary | ICD-10-CM

## 2011-06-12 NOTE — Telephone Encounter (Signed)
Refill request

## 2011-06-13 ENCOUNTER — Ambulatory Visit: Payer: PRIVATE HEALTH INSURANCE | Admitting: Family Medicine

## 2011-06-13 NOTE — Telephone Encounter (Signed)
Decreased her citalopram dose to 20 mg because of the change in recommendations for elderly. I left a message on her voicemail regarding this change.

## 2011-06-22 ENCOUNTER — Other Ambulatory Visit: Payer: Self-pay | Admitting: Family Medicine

## 2011-06-22 DIAGNOSIS — M48061 Spinal stenosis, lumbar region without neurogenic claudication: Secondary | ICD-10-CM

## 2011-06-22 NOTE — Telephone Encounter (Signed)
Will fwd. To Dr.Hale ... May need OV? Lorenda Hatchet, [name]

## 2011-06-22 NOTE — Telephone Encounter (Signed)
[name] [name] is calling for a refill on her Oxycodone.  Please call her when it is ready.

## 2011-06-23 ENCOUNTER — Other Ambulatory Visit: Payer: Self-pay | Admitting: Family Medicine

## 2011-06-23 DIAGNOSIS — Z1231 Encounter for screening mammogram for malignant neoplasm of breast: Secondary | ICD-10-CM

## 2011-06-23 MED ORDER — OXYCODONE HCL 20 MG PO TB12: 20.0000 mg | ORAL_TABLET | Freq: Two times a day (BID) | ORAL | Status: DC

## 2011-06-23 MED ORDER — OXYCODONE HCL 5 MG PO CAPS: ORAL_CAPSULE | ORAL | Status: DC

## 2011-06-23 NOTE — Telephone Encounter (Signed)
Patient notified  and Rx placed in file in front office for pick up.Marland Kitchen

## 2011-06-23 NOTE — Telephone Encounter (Signed)
Pt calling again to see if rx was ready

## 2011-06-23 NOTE — Telephone Encounter (Signed)
Oxycodone and Oxycontin handwritten and given to [employer]

## 2011-06-29 ENCOUNTER — Telehealth: Payer: Self-pay | Admitting: Family Medicine

## 2011-06-29 NOTE — Telephone Encounter (Signed)
Ms. Wheat needs a new Rx for a Dan Humphreys due to hers being broken.  It would need to go to [facility].  Please call the patient when this done.

## 2011-06-29 NOTE — Telephone Encounter (Signed)
Will ask Dr.Hale to write Rx and then I will fax it to [facility]. Lorenda Hatchet, [name]

## 2011-07-03 NOTE — Telephone Encounter (Signed)
Patient is calling back because she hasn't heard anything about this yet.  She did call [facility] and was told they haven't heard anything yet either.

## 2011-07-03 NOTE — Telephone Encounter (Signed)
Will fwd. To Dr.Hale for Rx .Arlyss Repress

## 2011-07-04 NOTE — Telephone Encounter (Signed)
Pt is asking again about her walker

## 2011-07-06 NOTE — Telephone Encounter (Signed)
Faxed. .Kam Kushnir  

## 2011-07-06 NOTE — Telephone Encounter (Signed)
Rx written for rolling walker. I don't have recommendations from PT for a specific type. Given to red team.

## 2011-07-11 ENCOUNTER — Ambulatory Visit: Payer: PRIVATE HEALTH INSURANCE | Admitting: Family Medicine

## 2011-07-13 ENCOUNTER — Ambulatory Visit (INDEPENDENT_AMBULATORY_CARE_PROVIDER_SITE_OTHER): Payer: Medicare Other | Admitting: Family Medicine

## 2011-07-13 ENCOUNTER — Ambulatory Visit
Admission: RE | Admit: 2011-07-13 | Discharge: 2011-07-13 | Disposition: A | Discharge status: Home / Self Care | Payer: Medicare Other | Source: Ambulatory Visit | Attending: Family Medicine | Admitting: Family Medicine

## 2011-07-13 VITALS — BP 128/80 | HR 68 | Temp 98.1°F | Ht 60.0 in | Wt 231.0 lb

## 2011-07-13 DIAGNOSIS — E1149 Type 2 diabetes mellitus with other diabetic neurological complication: Secondary | ICD-10-CM

## 2011-07-13 DIAGNOSIS — M48061 Spinal stenosis, lumbar region without neurogenic claudication: Secondary | ICD-10-CM

## 2011-07-13 DIAGNOSIS — N183 Chronic kidney disease, stage 3 unspecified: Secondary | ICD-10-CM

## 2011-07-13 DIAGNOSIS — I10 Essential (primary) hypertension: Secondary | ICD-10-CM

## 2011-07-13 DIAGNOSIS — D510 Vitamin B12 deficiency anemia due to intrinsic factor deficiency: Secondary | ICD-10-CM

## 2011-07-13 DIAGNOSIS — Z1231 Encounter for screening mammogram for malignant neoplasm of breast: Secondary | ICD-10-CM

## 2011-07-13 LAB — POCT GLYCOSYLATED HEMOGLOBIN (HGB A1C): Hemoglobin A1C: 6.9

## 2011-07-13 MED ORDER — PREGABALIN 150 MG PO CAPS: 150.0000 mg | ORAL_CAPSULE | Freq: Every day | ORAL | Status: DC

## 2011-07-13 MED ORDER — MORPHINE SULFATE 15 MG PO TABS: 15.0000 mg | ORAL_TABLET | ORAL | Status: DC | PRN

## 2011-07-13 MED ORDER — CYANOCOBALAMIN 1000 MCG/ML IJ SOLN
1000.0000 ug | Freq: Once | INTRAMUSCULAR | Status: AC
  Administered 2011-07-13: 1000 ug via INTRAMUSCULAR

## 2011-07-13 MED ORDER — FUROSEMIDE 40 MG PO TABS: 40.0000 mg | ORAL_TABLET | Freq: Every day | ORAL | Status: DC

## 2011-07-13 MED ORDER — MORPHINE SULFATE CR 30 MG PO TB12: 30.0000 mg | ORAL_TABLET | Freq: Two times a day (BID) | ORAL | Status: DC

## 2011-07-13 NOTE — Patient Instructions (Signed)
Please return to see Dr Sheffield Slider in 1 month  Your Oxycodone is changed to morphine so that your insurance will cover it. Please increase potassium foods like oranges  Your furosemide is increased to 40 mg daily to decrease swelling and blood pressure.

## 2011-07-13 NOTE — Assessment & Plan Note (Signed)
Bmet to check creatinine

## 2011-07-13 NOTE — Progress Notes (Signed)
  Subjective:    Patient ID: [name] [name], female    DOB: [birth year], 78 y.o.   MRN: [id number]  HPI Her back pain continues the same, sometimes radiating into the right leg. She's been out of her Oxycontin a couple days. Her insurance company is requiring her to change from [employer]. She can't sleep without Lyrica in the evening.   She requests an Rx for a wheeled walker with seat and brakes to replace the one that she has. My previous Rx only got her a walker with wheels.   She would like to increase her furosemide dose due to 40 mg daily.   She's had a couple hypoglycemia episodes when meals were delayed.   Admits to brief epispdes of chest pain at rest last 2 weekends after supper.   Review of Systems     Objective:   Physical Exam  Cardiovascular: Normal rate and regular rhythm.        Diminished pedal pulses  Pulmonary/Chest: Effort normal and breath sounds normal.  Musculoskeletal: She exhibits edema.       2+ edema  Psychiatric: She has a normal mood and affect. Her behavior is normal.          Assessment & Plan:

## 2011-07-13 NOTE — Assessment & Plan Note (Signed)
Not well controlled. Will increase furosemide to help this and edema

## 2011-07-14 ENCOUNTER — Encounter: Payer: Self-pay | Admitting: Family Medicine

## 2011-07-14 DIAGNOSIS — N183 Chronic kidney disease, stage 3 (moderate): Secondary | ICD-10-CM

## 2011-07-14 LAB — CBC
HCT: 37.1 % (ref 36.0–46.0)
Hemoglobin: 11.4 g/dL — ABNORMAL LOW (ref 12.0–15.0)
MCH: 30.1 pg (ref 26.0–34.0)
MCHC: 30.7 g/dL (ref 30.0–36.0)
RBC: 3.79 MIL/uL — ABNORMAL LOW (ref 3.87–5.11)

## 2011-07-14 LAB — BASIC METABOLIC PANEL
CO2: 28 mEq/L (ref 19–32)
Chloride: 100 mEq/L (ref 96–112)
Creat: 1.47 mg/dL — ABNORMAL HIGH (ref 0.50–1.10)
Potassium: 4.5 mEq/L (ref 3.5–5.3)
Sodium: 139 mEq/L (ref 135–145)

## 2011-07-21 ENCOUNTER — Other Ambulatory Visit: Payer: Self-pay | Admitting: Family Medicine

## 2011-07-21 DIAGNOSIS — Z1231 Encounter for screening mammogram for malignant neoplasm of breast: Secondary | ICD-10-CM

## 2011-08-03 ENCOUNTER — Other Ambulatory Visit: Payer: Self-pay | Admitting: Family Medicine

## 2011-08-03 DIAGNOSIS — I10 Essential (primary) hypertension: Secondary | ICD-10-CM

## 2011-08-03 NOTE — Telephone Encounter (Signed)
Refill request

## 2011-08-07 ENCOUNTER — Other Ambulatory Visit: Payer: Self-pay | Admitting: Family Medicine

## 2011-08-07 DIAGNOSIS — R928 Other abnormal and inconclusive findings on diagnostic imaging of breast: Secondary | ICD-10-CM

## 2011-08-14 ENCOUNTER — Other Ambulatory Visit: Payer: Medicare Other

## 2011-08-17 ENCOUNTER — Ambulatory Visit (INDEPENDENT_AMBULATORY_CARE_PROVIDER_SITE_OTHER): Payer: PRIVATE HEALTH INSURANCE | Admitting: Family Medicine

## 2011-08-17 ENCOUNTER — Ambulatory Visit: Payer: PRIVATE HEALTH INSURANCE

## 2011-08-17 VITALS — BP 116/67 | HR 80 | Temp 98.3°F | Ht 60.0 in | Wt 227.0 lb

## 2011-08-17 DIAGNOSIS — D510 Vitamin B12 deficiency anemia due to intrinsic factor deficiency: Secondary | ICD-10-CM

## 2011-08-17 DIAGNOSIS — E1149 Type 2 diabetes mellitus with other diabetic neurological complication: Secondary | ICD-10-CM

## 2011-08-17 DIAGNOSIS — E669 Obesity, unspecified: Secondary | ICD-10-CM

## 2011-08-17 DIAGNOSIS — E1142 Type 2 diabetes mellitus with diabetic polyneuropathy: Secondary | ICD-10-CM

## 2011-08-17 DIAGNOSIS — I10 Essential (primary) hypertension: Secondary | ICD-10-CM

## 2011-08-17 DIAGNOSIS — M48061 Spinal stenosis, lumbar region without neurogenic claudication: Secondary | ICD-10-CM

## 2011-08-17 MED ORDER — MORPHINE SULFATE CR 30 MG PO TB12: 30.0000 mg | ORAL_TABLET | Freq: Two times a day (BID) | ORAL | Status: AC

## 2011-08-17 MED ORDER — OXYCODONE HCL 5 MG PO CAPS: ORAL_CAPSULE | ORAL | Status: DC

## 2011-08-17 MED ORDER — OXYCODONE HCL 20 MG PO TB12: 20.0000 mg | ORAL_TABLET | Freq: Two times a day (BID) | ORAL | Status: DC

## 2011-08-17 MED ORDER — MORPHINE SULFATE 15 MG PO TABS: 15.0000 mg | ORAL_TABLET | ORAL | Status: AC | PRN

## 2011-08-17 MED ORDER — CYANOCOBALAMIN 1000 MCG/ML IJ SOLN
1000.0000 ug | Freq: Once | INTRAMUSCULAR | Status: AC
  Administered 2011-08-17: 1000 ug via INTRAMUSCULAR

## 2011-08-17 NOTE — Assessment & Plan Note (Signed)
Apparently can go some days without the pain medication. Morphine causing mild nausea. Given Rx's for one month of that, then for Oxycodone the following month. She is to ask the pharmacist to send a preAuth. Form soon. Informed that they may not approve the change, in which case they should bring back the oxycodone prescriptions and I will replace them with Morphine equivalents.

## 2011-08-17 NOTE — Assessment & Plan Note (Signed)
No improvement

## 2011-08-17 NOTE — Assessment & Plan Note (Signed)
well controlled  

## 2011-08-17 NOTE — Patient Instructions (Signed)
Return to see Dr Sheffield Slider in 2 months  Give all of the pain medicine prescriptions to the pharmacy. Ask them to send Dr Sheffield Slider the pre-authorization form for the Oxycodone and Oxycontin.   Stand up exercises twice daily x 8 repetitions. Goal is not needing to use arms to stand. Stretch your heel cords each time you stand.

## 2011-08-17 NOTE — Progress Notes (Signed)
  Subjective:    Patient ID: [name] [name], female    DOB: 1933/07/31, 78 y.o.   MRN: [id number]  HPIshe's been out of the morphine for pain in her back for the past few days. Period of of sciatic-type pain in the left leg but that is resolved. Causes nausea but no vomiting or significant constipation. She would  prefer to be  taking oxycodone medication. I informed her that we would have to send in a preauthorization to see if they would justify the based only on her nausea. She's not lost weight because of nausea.   She reports her fasting blood sugars are generally about 124. Test sugars at other times when she feels shaky and has had numbers in the 60's.  Her toes feel sore but she's not had any apparent gout attacks.  6 she is only taking the metoprolol 25 mg tablet daily ropinirole 4 mg at bedtime.  Does not believe she is taking simvastatin, senna or the Flonase nasal spray.      Review of Systems     Objective:   Physical Exam  Constitutional: No distress.       Generalized obesity   Cardiovascular: Normal rate and regular rhythm.   No murmur heard. Pulmonary/Chest: Effort normal and breath sounds normal. No respiratory distress. She has no wheezes. She has no rales.  Musculoskeletal: She exhibits no edema.       Stands from the chair using her arms, but can't stand with them crossed. Forward leaning posture.   Negative sitting root test bilaterally. Deep tendon reflexes decreased in the legs. Right Achilles tendons bilaterally.   Feet have well trim nails without signs of inflammation or dangerous callus.           Assessment & Plan:

## 2011-08-25 ENCOUNTER — Ambulatory Visit
Admission: RE | Admit: 2011-08-25 | Discharge: 2011-08-25 | Disposition: A | Discharge status: Home / Self Care | Payer: PRIVATE HEALTH INSURANCE | Source: Ambulatory Visit | Attending: Family Medicine | Admitting: Family Medicine

## 2011-08-25 DIAGNOSIS — R928 Other abnormal and inconclusive findings on diagnostic imaging of breast: Secondary | ICD-10-CM

## 2011-09-26 ENCOUNTER — Other Ambulatory Visit: Payer: Self-pay | Admitting: Family Medicine

## 2011-10-03 ENCOUNTER — Other Ambulatory Visit: Payer: Self-pay | Admitting: Family Medicine

## 2011-10-03 NOTE — Telephone Encounter (Signed)
Risk of serotonin syndrome

## 2011-10-11 ENCOUNTER — Telehealth: Payer: Self-pay | Admitting: Family Medicine

## 2011-10-11 ENCOUNTER — Other Ambulatory Visit: Payer: Self-pay | Admitting: Family Medicine

## 2011-10-11 NOTE — Telephone Encounter (Signed)
Form faxed back to the pharmacy denying the Tramadol.

## 2011-10-11 NOTE — Telephone Encounter (Signed)
Fax request from [facility] for Tramadol denied. Not in prescription history and concern for serotonin syndrome with SSRI use. Faxed back.

## 2011-10-17 ENCOUNTER — Telehealth: Payer: Self-pay | Admitting: Family Medicine

## 2011-10-17 ENCOUNTER — Ambulatory Visit: Payer: PRIVATE HEALTH INSURANCE | Admitting: Family Medicine

## 2011-10-17 NOTE — Telephone Encounter (Signed)
Patient is calling back about her pain medication.

## 2011-10-17 NOTE — Telephone Encounter (Signed)
Pt was supposed to come for appt today but had car trouble and was rescheduled for Thurs AM.  She called back to say her car was now working and wanted to come today.  There was nothing left on sched and she is asking if she can come pick up her pain meds.

## 2011-10-17 NOTE — Telephone Encounter (Signed)
Fwd to Dr.Hale. Lorenda Hatchet, [name]

## 2011-10-18 ENCOUNTER — Other Ambulatory Visit: Payer: Self-pay | Admitting: Family Medicine

## 2011-10-18 DIAGNOSIS — M48061 Spinal stenosis, lumbar region without neurogenic claudication: Secondary | ICD-10-CM

## 2011-10-18 MED ORDER — OXYCODONE HCL 20 MG PO TB12: 20.0000 mg | ORAL_TABLET | Freq: Two times a day (BID) | ORAL | Status: DC

## 2011-10-18 MED ORDER — OXYCODONE HCL 5 MG PO CAPS: ORAL_CAPSULE | ORAL | Status: DC

## 2011-10-18 NOTE — Telephone Encounter (Signed)
Hand written and left up front to be picked up.

## 2011-10-18 NOTE — Telephone Encounter (Signed)
Called patient and told her Rx is ready at front desk, also she still needs to keep her appointment tomorrow.[name] [name], [name]

## 2011-10-19 ENCOUNTER — Telehealth: Payer: Self-pay | Admitting: *Deleted

## 2011-10-19 ENCOUNTER — Ambulatory Visit (INDEPENDENT_AMBULATORY_CARE_PROVIDER_SITE_OTHER): Payer: PRIVATE HEALTH INSURANCE | Admitting: Family Medicine

## 2011-10-19 ENCOUNTER — Other Ambulatory Visit: Payer: Self-pay | Admitting: Family Medicine

## 2011-10-19 VITALS — BP 108/62 | HR 76 | Ht 60.0 in | Wt 226.8 lb

## 2011-10-19 DIAGNOSIS — E1142 Type 2 diabetes mellitus with diabetic polyneuropathy: Secondary | ICD-10-CM

## 2011-10-19 DIAGNOSIS — I10 Essential (primary) hypertension: Secondary | ICD-10-CM

## 2011-10-19 DIAGNOSIS — D510 Vitamin B12 deficiency anemia due to intrinsic factor deficiency: Secondary | ICD-10-CM

## 2011-10-19 DIAGNOSIS — E1149 Type 2 diabetes mellitus with other diabetic neurological complication: Secondary | ICD-10-CM

## 2011-10-19 DIAGNOSIS — E669 Obesity, unspecified: Secondary | ICD-10-CM

## 2011-10-19 DIAGNOSIS — M48061 Spinal stenosis, lumbar region without neurogenic claudication: Secondary | ICD-10-CM

## 2011-10-19 LAB — POCT UA - MICROALBUMIN
Albumin/Creatinine Ratio, Urine, POC: <30
Creatinine, POC: 50 mg/dL
Microalbumin Ur, POC: 10 mg/dL

## 2011-10-19 LAB — POCT GLYCOSYLATED HEMOGLOBIN (HGB A1C): Hemoglobin A1C: 7.1

## 2011-10-19 MED ORDER — CYANOCOBALAMIN 1000 MCG/ML IJ SOLN
1000.0000 ug | Freq: Once | INTRAMUSCULAR | Status: AC
  Administered 2011-10-19: 1000 ug via INTRAMUSCULAR

## 2011-10-19 NOTE — Assessment & Plan Note (Signed)
Reasonable control. 

## 2011-10-19 NOTE — Progress Notes (Signed)
  Subjective:    Patient ID: [name] [name], female    DOB: 10/27/1933, 78 y.o.   MRN: [id number]  HPI She drove herself here after taking a half of an Oxycodone tablet. Pain in her back is currently 4-5/10. The [facility] has declined to fill the Oxycontin Rx in the past because it was presented by her son and grandson who they suspect of abusing medications. The [facility] did not fill it due to the need for pre-authorization. She admits to using several pharmacies, but denies diversion of the medication. I couldn't find evidence of narcotic prescriptions filled under her son's name and hers were at the appropriate times and all written by me.  Taking it enables her to walk short distances with her walker and to shop at [place] using one of their scooters. Since she had another grandson who OD'd in her home, not on her medications, she is aware of the risks of these medications. keratoses, telangiectasia, purpura consistent with age and solar changes   She continues wearing adult diapers due to urge incontinence which the Vesicare has only slightly improved. She denies dysuria or nocturia.   Mild increase in ankle edema. The right ankle has been less stable since she fell, last time over a year ago. She doesn't feel lightheaded standing.  She didn't bring her medications, so we reviewed them by memory. She hasn't been taking her Alendronate.  Review of Systems     Objective:   Physical Exam  Constitutional:       Generalized obesity  Cardiovascular: Normal rate and regular rhythm.   No murmur heard. Pulmonary/Chest: Effort normal and breath sounds normal.  Musculoskeletal: She exhibits edema.       1+ right and 2+ left ankle edema Superficial varicosities greater on left foot. Hallux valgus with overriding 2nd toe greater on left foot  Neurological: She is alert.       Gait using a rolling walker is stooped forward and wide based.   Skin:       Small keratoses, telangiectasia, purpura  consistent with age and solar changes  Psychiatric:       Dysthymic affect as usual          Assessment & Plan:

## 2011-10-19 NOTE — Assessment & Plan Note (Signed)
No improvement

## 2011-10-19 NOTE — Telephone Encounter (Signed)
Called the pharmacist in re: Narcotics Rx for pt. [name] [name] reports, that the pharmacy does not dispense pain medications for the pt again. They suspect drug abuse by pt's son and grandson. [name] [name] also reports, that the pt get her narcotics from different pharmacies in town. Fwd. To Dr.Hale for info. Lorenda Hatchet, [name]

## 2011-10-19 NOTE — Telephone Encounter (Signed)
Form completed and place on [employer]

## 2011-10-19 NOTE — Telephone Encounter (Signed)
Faxed to insurance

## 2011-10-19 NOTE — Telephone Encounter (Signed)
Received  call from [facility] on [address] stating Dr. Sheffield Slider did not put date on RX  for Oxycontin  that they received today.Marland Kitchen Consulted with Dr. Sheffield Slider and he states he wrote RX  today. He gave me the the RX that was written for patient yesterday for Oxycontin 20 mg # 60 . This is torn in pieces and put in shred box.

## 2011-10-19 NOTE — Patient Instructions (Signed)
Please return to see Dr Sheffield Slider in 3 months.   Contact me for further oxycodone prescriptions when you find out the problem at the pharmacy  Pick one pharmacy and always get the refills yourself rather than have family members get them.

## 2011-10-19 NOTE — Assessment & Plan Note (Signed)
On low end so will not increase her furosemide. Encouraged ambulation

## 2011-10-19 NOTE — Assessment & Plan Note (Signed)
Needs the narcotic to keep her active. Advised to pick-up the medication herself and to use only one pharmacy as specified in her pain contract. I rewrote the Oxycontin Rx and we destroyed the one written yesterday. I later received a preauthorization form from [facility] which I completed noting failure of Tramadol and side effects of Morphine.

## 2011-10-19 NOTE — Telephone Encounter (Signed)
PA required for oxycontin, Form placed in MD box.

## 2011-10-25 ENCOUNTER — Encounter: Payer: Self-pay | Admitting: Family Medicine

## 2011-10-26 LAB — OXYCODONE, UA, CONFIRM BY GC/MS: Oxycodone, Conf, Urine: NEGATIVE NG/ML

## 2011-11-09 ENCOUNTER — Other Ambulatory Visit: Payer: Self-pay | Admitting: Family Medicine

## 2011-11-14 ENCOUNTER — Telehealth: Payer: Self-pay | Admitting: Family Medicine

## 2011-11-14 NOTE — Telephone Encounter (Signed)
Patient is calling for refills on her Oxycontin.  Please give her a call when it is ready.

## 2011-11-14 NOTE — Telephone Encounter (Signed)
Forward to PCP for refill on pain meds.[name] [name], [name] [name]

## 2011-11-15 ENCOUNTER — Other Ambulatory Visit: Payer: Self-pay | Admitting: Family Medicine

## 2011-11-15 DIAGNOSIS — M48061 Spinal stenosis, lumbar region without neurogenic claudication: Secondary | ICD-10-CM

## 2011-11-15 MED ORDER — OXYCODONE HCL 5 MG PO CAPS: ORAL_CAPSULE | ORAL | Status: DC

## 2011-11-15 MED ORDER — OXYCODONE HCL 20 MG PO TB12: 20.0000 mg | ORAL_TABLET | Freq: Two times a day (BID) | ORAL | Status: DC

## 2011-11-15 NOTE — Telephone Encounter (Signed)
I hand wrote the prescriptions and placed them out front. The staff has informed her that she needs to personally pickup the prescriptions and only she should be calling for refills. Last visit I reviewed the pain contract and the clause that she should always use the same pharmacy.

## 2011-11-15 NOTE — Telephone Encounter (Signed)
Handwritten and left at front for patient to pick-up

## 2011-11-23 ENCOUNTER — Telehealth: Payer: Self-pay | Admitting: Family Medicine

## 2011-11-23 NOTE — Telephone Encounter (Signed)
Forms dropped off to be filled out for diabetic shoes.  Also need clinical notes.  Please fax forms when completed.  Placed in dr's box.

## 2011-11-23 NOTE — Telephone Encounter (Signed)
A form was completed for [facility] supply for diabetic shoes with foot deformity, bunions listed as the reason that they are needed.

## 2011-11-24 NOTE — Telephone Encounter (Signed)
Diabetic shoe form completed by Dr Sheffield Slider and faxed to [phone].  [name] [name]

## 2011-12-14 ENCOUNTER — Telehealth: Payer: Self-pay | Admitting: Family Medicine

## 2011-12-14 DIAGNOSIS — M48061 Spinal stenosis, lumbar region without neurogenic claudication: Secondary | ICD-10-CM

## 2011-12-14 MED ORDER — OXYCODONE HCL 20 MG PO TB12: 20.0000 mg | ORAL_TABLET | Freq: Two times a day (BID) | ORAL | Status: DC

## 2011-12-14 MED ORDER — OXYCODONE HCL 5 MG PO CAPS: ORAL_CAPSULE | ORAL | Status: DC

## 2011-12-14 NOTE — Telephone Encounter (Signed)
Daughter needs to speak to Dr. Sheffield Slider about what she needs to do about Power of Attorney paperwork.

## 2011-12-14 NOTE — Telephone Encounter (Signed)
appt with dr.hale 12-26-11 at 3:15 pm. Informed pt's daughter of appt and she agreed to keep it. Lorenda Hatchet, [name]

## 2011-12-14 NOTE — Telephone Encounter (Signed)
Pt is calling for refill on her pain meds - pls call when ready

## 2011-12-14 NOTE — Telephone Encounter (Signed)
Prescriptions for Oxycodone and Oxycontin written for the next two months so she'll have enough until visit with me 7/30. I will do a blood test for Oxycodone at that time.   I subsequently spoke with her daughter who is concerned that the pain medications are being diverted. I destroyed the above prescriptions and rewrote them for two weeks. Her daughter will bring her to the July 2nd appointment when I will do the blood test if she says she needs to continue taking them after informing her of the negative test last visit. I will give her the medical POA form to be completed as she desires.

## 2011-12-14 NOTE — Telephone Encounter (Signed)
Forward to PCP for pain med request. 

## 2011-12-26 ENCOUNTER — Encounter: Payer: Self-pay | Admitting: Family Medicine

## 2011-12-26 ENCOUNTER — Ambulatory Visit (INDEPENDENT_AMBULATORY_CARE_PROVIDER_SITE_OTHER): Payer: PRIVATE HEALTH INSURANCE | Admitting: Family Medicine

## 2011-12-26 VITALS — BP 116/62 | HR 65 | Ht 60.0 in | Wt 226.0 lb

## 2011-12-26 DIAGNOSIS — M48061 Spinal stenosis, lumbar region without neurogenic claudication: Secondary | ICD-10-CM

## 2011-12-26 DIAGNOSIS — D510 Vitamin B12 deficiency anemia due to intrinsic factor deficiency: Secondary | ICD-10-CM

## 2011-12-26 DIAGNOSIS — Z79899 Other long term (current) drug therapy: Secondary | ICD-10-CM

## 2011-12-26 DIAGNOSIS — I10 Essential (primary) hypertension: Secondary | ICD-10-CM

## 2011-12-26 LAB — CBC
HCT: 38.3 % (ref 36.0–46.0)
Hemoglobin: 12.4 g/dL (ref 12.0–15.0)
MCH: 28.6 pg (ref 26.0–34.0)
MCHC: 32.4 g/dL (ref 30.0–36.0)
MCV: 88.2 fL (ref 78.0–100.0)
RDW: 17.3 % — ABNORMAL HIGH (ref 11.5–15.5)

## 2011-12-26 LAB — BASIC METABOLIC PANEL
BUN: 22 mg/dL (ref 6–23)
Calcium: 10 mg/dL (ref 8.4–10.5)
Creat: 1.37 mg/dL — ABNORMAL HIGH (ref 0.50–1.10)
Glucose, Bld: 70 mg/dL (ref 70–99)

## 2011-12-26 MED ORDER — CYANOCOBALAMIN 1000 MCG/ML IJ SOLN
1000.0000 ug | Freq: Once | INTRAMUSCULAR | Status: AC
  Administered 2011-12-26: 1000 ug via INTRAMUSCULAR

## 2011-12-26 MED ORDER — CYANOCOBALAMIN 1000 MCG/ML IJ SOLN: 1000.0000 ug | Freq: Once | INTRAMUSCULAR | Status: DC

## 2011-12-26 NOTE — Assessment & Plan Note (Signed)
Give B12 shot, check CBC

## 2011-12-26 NOTE — Progress Notes (Signed)
  Subjective:    Patient ID: [name] [name], female    DOB: 1933-11-20, 78 y.o.   MRN: [id number]  HPI Back pain - She comes to the clinic with Duwayne Heck who is married to her grandson Viviann Spare. Received a note from her daughter Tia Alert in saying that in this young lady is on the persons who is taking her oxycodone. Sustained a says the pain in her back is about the same. She last took a oxycodone last evening and did not take an OxyContin this morning.  Wheezing - comes occasionally and she needs albuterol.  Depression - her son Viviann Spare died 10-28-11 at home at age 80. He denies being depressed currently.  Hypertension - she did not bring her medications but says that she is taking all of those. She denies chest pain or shortness of breath.   Review of Systems     Objective:   Physical Exam  Constitutional:       Generalized obesity  Cardiovascular: Normal rate and regular rhythm.   Pulmonary/Chest: Effort normal and breath sounds normal. No respiratory distress. She has no wheezes. She has no rales.  Musculoskeletal: She exhibits edema.       1+bilateral ankle edema  Neurological: She is alert.  Psychiatric:       Subdued affect          Assessment & Plan:

## 2011-12-26 NOTE — Assessment & Plan Note (Signed)
Supposedly requiring Oxycontin, but took none this morning. Will check urine for Oxycodone which she last took last PM.

## 2011-12-26 NOTE — Patient Instructions (Addendum)
I will let you know your lab results.  We will schedule follow up when I have the results.

## 2011-12-26 NOTE — Assessment & Plan Note (Signed)
Systolic is high. Will check CMET

## 2011-12-27 ENCOUNTER — Telehealth: Payer: Self-pay | Admitting: Family Medicine

## 2011-12-27 DIAGNOSIS — R062 Wheezing: Secondary | ICD-10-CM

## 2011-12-27 DIAGNOSIS — J441 Chronic obstructive pulmonary disease with (acute) exacerbation: Secondary | ICD-10-CM | POA: Insufficient documentation

## 2011-12-27 LAB — DRUG SCREEN, URINE
Benzodiazepines.: NEGATIVE
Creatinine,U: 152.76 mg/dL
Methadone: NEGATIVE
Phencyclidine (PCP): NEGATIVE
Propoxyphene: NEGATIVE

## 2011-12-27 MED ORDER — ALBUTEROL SULFATE HFA 108 (90 BASE) MCG/ACT IN AERS: 2.0000 | INHALATION_SPRAY | Freq: Four times a day (QID) | RESPIRATORY_TRACT | Status: DC | PRN

## 2011-12-27 NOTE — Telephone Encounter (Signed)
I called [name] [name] to inform her that I will no longer be prescribing Oxycodone and Oxycontin because there are multiple indications that some of the medication is being diverted. Since she had not taken the Oxycontin the morning of the last office visit, and she did not have signs of significant pain on exam, I do not expect her to have withdrawal problems. She had reported taking Oxycodone the evening before and opiate did appear on her urine drug screen.   It is notable that a grandson died of an overdose a few years ago while living with her and her son, [name] [name], recently was found dead in bed while living with her. She has another grandson, [name] [name] who are currently living with her.   I didn't share with [name] [name] that her daughter, [name] [name], [phone] who had called me with a concern that family living with [name] [name] were taking her medications. She said that one of them was going to bring her to the appointment with me which turned out to be the grandson's [name].   I offered [name] [name] to come in to see me next week to discuss non-narcotic treatments for her chronic pain.

## 2011-12-27 NOTE — Assessment & Plan Note (Signed)
Subjective, not heard on exam yesterday

## 2011-12-27 NOTE — Telephone Encounter (Signed)
Need rx for pain med.  Please call when ready for pick up.

## 2011-12-27 NOTE — Telephone Encounter (Signed)
She called back to remind me that she had requested Albuterol that she says she used in the past though I can't find it in the history. Her chest was clear. I sent in one MDI, but wonder if she is getting if for a family member.

## 2012-01-19 ENCOUNTER — Other Ambulatory Visit: Payer: Self-pay | Admitting: Family Medicine

## 2012-01-19 DIAGNOSIS — M48061 Spinal stenosis, lumbar region without neurogenic claudication: Secondary | ICD-10-CM

## 2012-01-19 MED ORDER — PREGABALIN 150 MG PO CAPS: 150.0000 mg | ORAL_CAPSULE | Freq: Every day | ORAL | Status: DC

## 2012-01-23 ENCOUNTER — Ambulatory Visit: Payer: PRIVATE HEALTH INSURANCE | Admitting: Family Medicine

## 2012-02-08 ENCOUNTER — Ambulatory Visit (INDEPENDENT_AMBULATORY_CARE_PROVIDER_SITE_OTHER): Payer: PRIVATE HEALTH INSURANCE | Admitting: Family Medicine

## 2012-02-08 ENCOUNTER — Encounter: Payer: Self-pay | Admitting: Family Medicine

## 2012-02-08 VITALS — BP 123/71 | HR 74 | Ht 60.0 in | Wt 227.0 lb

## 2012-02-08 DIAGNOSIS — D510 Vitamin B12 deficiency anemia due to intrinsic factor deficiency: Secondary | ICD-10-CM

## 2012-02-08 DIAGNOSIS — E1142 Type 2 diabetes mellitus with diabetic polyneuropathy: Secondary | ICD-10-CM

## 2012-02-08 DIAGNOSIS — B372 Candidiasis of skin and nail: Secondary | ICD-10-CM

## 2012-02-08 DIAGNOSIS — R269 Unspecified abnormalities of gait and mobility: Secondary | ICD-10-CM

## 2012-02-08 DIAGNOSIS — S0510XA Contusion of eyeball and orbital tissues, unspecified eye, initial encounter: Secondary | ICD-10-CM

## 2012-02-08 DIAGNOSIS — E785 Hyperlipidemia, unspecified: Secondary | ICD-10-CM

## 2012-02-08 DIAGNOSIS — E1149 Type 2 diabetes mellitus with other diabetic neurological complication: Secondary | ICD-10-CM

## 2012-02-08 DIAGNOSIS — Z79899 Other long term (current) drug therapy: Secondary | ICD-10-CM

## 2012-02-08 LAB — POCT GLYCOSYLATED HEMOGLOBIN (HGB A1C): Hemoglobin A1C: 7.4

## 2012-02-08 MED ORDER — SIMVASTATIN 20 MG PO TABS: 20.0000 mg | ORAL_TABLET | Freq: Every day | ORAL | Status: DC

## 2012-02-08 MED ORDER — CYANOCOBALAMIN 1000 MCG/ML IJ SOLN
1000.0000 ug | Freq: Once | INTRAMUSCULAR | Status: AC
  Administered 2012-02-08: 1000 ug via INTRAMUSCULAR

## 2012-02-08 MED ORDER — NYSTATIN 100000 UNIT/GM EX POWD: Freq: Four times a day (QID) | CUTANEOUS | Status: DC

## 2012-02-08 NOTE — Assessment & Plan Note (Signed)
Pt has not been taking Zocor 20 mg qd.  Will restart this today.

## 2012-02-08 NOTE — Patient Instructions (Addendum)
Please return to see Dr Sheffield Slider in 1 month   Subdural Hematoma A subdural hematoma is a collection of blood between the brain and its tough outer covering membrane (the dura). This is caused by bleeding (hemorrhage) from a ruptured blood vessel. There are two types of subdural hematomas.  Acute subdural hemorrhage. This is subdural bleeding that develops shortly after a serious blow to the head. Blood collects very fast in this injury and may cause the pressure to rise within the brain. If not diagnosed and treated promptly, severe brain injury or death can occur.   Chronic subdural hemorrhage. This is when bleeding develops slowly, over weeks to months.  The brain takes up all the space in the skull, so there is no room for blood clots. The clots will push down on the brain and the bigger the clot, the more it pushes on the brain. The brain gets very irritated when touched by blood and can stop working. Moreover, depending on the part of the brain that stops working, that is the function the patient will lose. It can compress the brain and eventually cause death. CAUSES  Some kind of a trauma causing an injury to the head:   Motor vehicle-related accidents.   Falls in the elderly (more than 76 years old).  SYMPTOMS  The length of time it takes symptoms to develop and improve varies:  An acute subdural hemorrhage develops over minutes to hours. Symptoms can include:   Temporary loss of consciousness (concussion).   Weakness of arms or legs on one side of the body.   Changes in vision or speech.   Severe headache.   Seizures.   Nausea and vomiting.   Increased sleepiness.   A chronic subdural hemorrhage develops over weeks to months. Symptoms may develop slowly and produce less noticeable problems or changes. These patients will have many small bleeds that over a period of weeks to months, add up to become a big enough clot to start having symptoms, which may include:   Mild  headache.   Change in personality.   Loss of balance or difficulty walking.   Weakness, numbness, or tingling in the arms or legs.   Nausea or vomiting.   Memory loss.   Double vision.   Increased sleepiness.  DIAGNOSIS  All head injuries should be checked out quickly by a caregiver. This is especially true if there has been any loss of consciousness. If you are able, the caregiver will usually ask a list of questions. Your caregiver will perform a thorough physical and neurological exam. If the caregiver suspects there is bleeding within the head, he/she will order a CT scan. Although it is very easy to see blood on a CT scan, this is not a good way to see damage to the brain itself. If there is blood on the scan, its color will help the caregiver figure out how old it is. Utilizing the information from the scan, the history and the physical exam, the caregiver can figure out why this person is not feeling well. TREATMENT   Acute subdural hemorrhage:   Requires medical attention right away! In many cases, emergency surgery must be performed. The blood clot must be removed. The purpose of having an operation and having the clot removed is to make room and let the brain rest comfortably.   You may be placed in an intensive care unit (ICU) where a nurse can watch you very closely and look for any changes in your function  or behavior. Careful attention will be paid to your breathing, blood pressure and neurological function.   Sometimes, medications or controlled breathing through a ventilator is needed may be needed to decrease the pressure in the brain. This is especially true if there is any swelling of the brain itself.   If the patient is not awake, they will need a monitor placed in their head that will tell the doctors and nurses how much pressure there is in the brain. A sudden increase in the pressure may mean that the bleeding has started again.   Chronic subdural hemorrhages:    May require emergency treatment. Most physicians will recommend surgery for larger hemorrhages and those that cause neurological symptoms.   Others do not require treatment at all. Simple treatment with bed rest, medications and observation may be reasonable for smaller hematomas that cause minimal or no symptoms.   People who develop a subdural hemorrhage are at risk of developing seizures, even after the hematoma has been treated. To prevent seizures, some caregivers will prescribe anti-seizure (anticonvulsant) medications for a year or longer.  PREVENTION  Accidents, including head injuries, are the leading cause of death in young people. Many others could be prevented with simple precautions or safety equipment. To help prevent head injuries:  Avoid drinking and driving or doing drugs and driving.   Practice job safety, especially if your job involves working high above ground.   Have your vision checked regularly. Poor vision can increase your risk of falls and other accidents.   Clear your home or apartment of hazards. For example, throw rugs and extension cords can cause you to trip and fall. If you feel unsteady on your feet, consider using a cane or walker.   If you play a contact sport such as football, hockey or soccer and you experience a significant head injury, allow enough time for healing before you start playing again (up to 15 days). A repeated injury that occurs during this fragile repair period is likely to result in hemorrhage. This is called the second impact syndrome.   If you take blood thinners (warfarin [Coumadin], aspirin and other anti-inflammatories), make sure that you have close medical supervision with regular monitoring. If you are on any blood thinners then even a very small fall or trauma can cause a subdural bleed. You should not hesitate to seek medical attention regardless of how minor you think your symptoms are.   Wear a helmet when bicycling or  snow-boarding.  SEEK IMMEDIATE MEDICAL CARE IF:  You experience a head injury with any of the following symptoms:   Drowsiness or a decrease in alertness.   Confusion or forgetfulness.   Slurred speech.   Irrational or aggressive behavior.   Numbness or paralysis in any part of the body.   Has a bleeding disorder.   Feel sick to your stomach (nauseous) or throw up (vomit).   Difficulty walking or poor coordination.   Double vision.   Seizures.   Has a history of heavy alcohol use.   Takes medications to thin the blood.  FOR MORE INFORMATION National Institute of Neurological Disorders and Stroke: [employer].co.uk American Association of Neurological Surgeons: www.neurosurgerytoday.org American Academy of Neurology (AAN): ComparePet.cz Brain Injury Association of America: www.biausa.org Document Released: 04/29/2004 Document Revised: 06/01/2011 Document Reviewed: 01/15/2009 [facility], [facility], [facility] Patient Information 2012 [place], [place].

## 2012-02-08 NOTE — Assessment & Plan Note (Addendum)
Pt has had trouble walking and had a fall onto her L side of the head.  Evidence of ecchymosis around her L eye but no focal neurologic deficits.  MMS exam today 26/30 as well.  Will not CT her head as low likelihood of intracranial bleed.  Gave information on what to look for in regards to bleeding inside her head in regards to going to the ED.

## 2012-02-08 NOTE — Progress Notes (Signed)
[name] [name] is a 78 y.o. who presents today for fall this AM after trying to ambulate from bed.  She was able to take a few steps with her walker, felt like her feet wouldn't move, and fell into her walker, landing on the L side of her face.  Pt did not have LOC, dizziness, HA, changes in vision, changes in mentation, loss of motor strength, numbness, burning.  Pt's daughter came into the room, had to call her daughter in law to help her get up.  Over the last 4 hours, pt has not had nausea, vomiting, HA, lightheaded, weakness, loss of bowel or bladder.      Pt's daughter has noticed pt has become more disoriented over the past several months.  She is looking to have her move closer to her, maybe living with her for closer care.     Past Medical History  Diagnosis Date  . History of cystoscopy 08/2005    normal    History   Social History  . Marital Status: Widowed    Spouse Name: Onalee Hua    Number of Children: 6  . Years of Education: 8    Social History Narrative   Widowed 1997Son living with her, also grandson, Farrell Ours, died in her home of a drug OD 9/01Her daughter and his mother died of alcoholism    Family History  Problem Relation Age of Onset  . Alcohol abuse Son   . Alcohol abuse Daughter     died  . Diabetes Mother   . Diabetes Daughter   . Diabetes Sister   . Coronary artery disease Sister     MI  . Alzheimer's disease Sister     died of heart proble  . Diabetes Daughter   . Diabetes Daughter   . Diabetes Son   . Diabetes Son     Current outpatient prescriptions:albuterol (PROVENTIL HFA;VENTOLIN HFA) 108 (90 BASE) MCG/ACT inhaler, Inhale 2 puffs into the lungs every 6 (six) hours as needed for wheezing., Disp: 1 Inhaler, Rfl: 0;  alendronate (FOSAMAX) 70 MG tablet, Take 70 mg by mouth every 7 (seven) days.  Take in the morning with a full glass of water, on an empty stomach, and do not take anything else by mouth or lie down for the next 30  min., Disp: , Rfl:  allopurinol (ZYLOPRIM) 100 MG tablet, Take 2 tablets (200 mg total) by mouth daily., Disp: 180 tablet, Rfl: 3;  aspirin (BAYER ASPIRIN) 325 MG tablet, Take 325 mg by mouth daily. , Disp: , Rfl: ;  calcium-vitamin D (CALCIUM 500/D) 500-200 MG-UNIT per tablet, Take 1 tablet by mouth 3 (three) times daily. , Disp: , Rfl: ;  citalopram (CELEXA) 20 MG tablet, Take 40 mg by mouth daily. , Disp: , Rfl:  COLCRYS 0.6 MG tablet, TAKE ONE (1) TABLET(S) BY MOUTH DAILY, Disp: 90 tablet, Rfl: 3;  fluticasone (FLONASE) 50 MCG/ACT nasal spray, 2 sprays by Nasal route daily. , Disp: , Rfl: ;  furosemide (LASIX) 40 MG tablet, Take 1 tablet (40 mg total) by mouth daily., Disp: 30 tablet, Rfl: 11;  glipiZIDE (GLUCOTROL) 5 MG tablet, TAKE ONE (1) TABLET(S) DAILY, Disp: 32 tablet, Rfl: 11 glucose blood test strip, Use as instructed; patient needs the Accu-check plus strips, Disp: 100 each, Rfl: 12;  metFORMIN (GLUCOPHAGE) 500 MG tablet, Take 2 tabs in AM and one in PM, Disp: 120 tablet, Rfl: 11;  metoprolol succinate (TOPROL-XL) 25 MG 24 hr tablet, TAKE ONE (1)  TABLET(S) BY MOUTH DAILY, Disp: 90 tablet, Rfl: 3;  Omega-3 Fatty Acids (FISH OIL) 1000 MG CAPS, Ran out (no other instructions) , Disp: , Rfl:  oxycodone (OXY-IR) 5 MG capsule, Take one tab twice daily as needed for pain, Disp: 30 capsule, Rfl: 0;  oxyCODONE (OXYCONTIN) 20 MG 12 hr tablet, Take 1 tablet (20 mg total) by mouth every 12 (twelve) hours. for pain, Disp: 30 tablet, Rfl: 0;  pregabalin (LYRICA) 150 MG capsule, Take 1 capsule (150 mg total) by mouth daily., Disp: 30 capsule, Rfl: 11 rOPINIRole (REQUIP) 4 MG tablet, Take 1 tablet (4 mg total) by mouth at bedtime., Disp: 30 tablet, Rfl: 11;  senna (SENOKOT) 8.6 MG tablet, Take 1-2 tablets by mouth daily. for constipation, Disp: , Rfl: ;  simvastatin (ZOCOR) 20 MG tablet, Take 20 mg by mouth at bedtime. , Disp: , Rfl: ;  solifenacin (VESICARE) 5 MG tablet, Take 1 tablet (5 mg total) by mouth  daily., Disp: 30 tablet, Rfl: 11 Current facility-administered medications:cyanocobalamin ((VITAMIN B-12)) injection 1,000 mcg, 1,000 mcg, Intramuscular, Once, Tobin Chad, MD  Review of Systems - Negative except for HPI  Physical Exam Filed Vitals:   02/08/12 1056  BP: 123/71  Pulse: 74    Gen: NAD, Well nourished, Well developed HEENT: PERLA, EOMI, + 2 cm x 2 cm ecchymosis around L orbital lateral region. Neck: no JVD Cardio: RRR, No murmurs/gallops/rubs Lungs: CTA, no wheezes, rhonchi, crackles Abd: NABS, soft nontender nondistended MSK: ROM normal  Neuro: CN 2-12 intact, MS 5/5 B/L UE and LE, +2 patellar and achilles relfex b/l  Psych: AAO x 3.  MMS exam 26/30   Results for orders placed in visit on 02/08/12 (from the past 72 hour(s))  POCT GLYCOSYLATED HEMOGLOBIN (HGB A1C)     Status: Normal   Collection Time   02/08/12 10:54 AM      Component Value Range Comment   Hemoglobin A1C 7.4

## 2012-02-17 ENCOUNTER — Other Ambulatory Visit: Payer: Self-pay | Admitting: Family Medicine

## 2012-02-22 ENCOUNTER — Telehealth: Payer: Self-pay | Admitting: Family Medicine

## 2012-02-22 NOTE — Telephone Encounter (Signed)
Form signed to fax back for diapers to [facility]

## 2012-03-15 ENCOUNTER — Other Ambulatory Visit: Payer: Self-pay | Admitting: Family Medicine

## 2012-03-21 ENCOUNTER — Encounter: Payer: Self-pay | Admitting: Family Medicine

## 2012-03-21 ENCOUNTER — Ambulatory Visit (INDEPENDENT_AMBULATORY_CARE_PROVIDER_SITE_OTHER): Payer: PRIVATE HEALTH INSURANCE | Admitting: Family Medicine

## 2012-03-21 VITALS — BP 143/83 | HR 74 | Temp 98.9°F | Ht 60.0 in | Wt 229.0 lb

## 2012-03-21 DIAGNOSIS — I10 Essential (primary) hypertension: Secondary | ICD-10-CM

## 2012-03-21 DIAGNOSIS — D510 Vitamin B12 deficiency anemia due to intrinsic factor deficiency: Secondary | ICD-10-CM

## 2012-03-21 DIAGNOSIS — Z23 Encounter for immunization: Secondary | ICD-10-CM

## 2012-03-21 DIAGNOSIS — E669 Obesity, unspecified: Secondary | ICD-10-CM

## 2012-03-21 DIAGNOSIS — G2581 Restless legs syndrome: Secondary | ICD-10-CM

## 2012-03-21 DIAGNOSIS — R269 Unspecified abnormalities of gait and mobility: Secondary | ICD-10-CM

## 2012-03-21 MED ORDER — TRAMADOL-ACETAMINOPHEN 37.5-325 MG PO TABS: 1.0000 | ORAL_TABLET | Freq: Four times a day (QID) | ORAL | Status: DC | PRN

## 2012-03-21 MED ORDER — CYANOCOBALAMIN 1000 MCG/ML IJ SOLN
1000.0000 ug | Freq: Once | INTRAMUSCULAR | Status: AC
  Administered 2012-03-21: 1000 ug via INTRAMUSCULAR

## 2012-03-21 NOTE — Assessment & Plan Note (Signed)
Will give Vitamin B12 shot today

## 2012-03-21 NOTE — Assessment & Plan Note (Signed)
Continues to respond to Requip

## 2012-03-21 NOTE — Assessment & Plan Note (Signed)
I encouraged weight loss 

## 2012-03-21 NOTE — Assessment & Plan Note (Addendum)
Rx written for scooter, with agreement that she will do standing up exercises daily when someone is there to spot her. Concern for cervical myelopathy, but not a candidate for surgery without more definitive symptoms, so I deferred MRI, but reviewed red flags with her.

## 2012-03-21 NOTE — Patient Instructions (Signed)
When someone is home, stand up 6-8 times daily to decrease fall risk.   Please return to see Dr Sheffield Slider in 3months.We'll do blood tests that visit
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
## 2012-03-22 ENCOUNTER — Other Ambulatory Visit: Payer: Self-pay | Admitting: Family Medicine

## 2012-04-05 ENCOUNTER — Inpatient Hospital Stay (HOSPITAL_COMMUNITY)
Admission: EM | Admit: 2012-04-05 | Discharge: 2012-04-09 | DRG: 871 | Disposition: A | Discharge status: Home Health | Payer: Medicare HMO | Attending: Family Medicine | Admitting: Family Medicine

## 2012-04-05 ENCOUNTER — Encounter (HOSPITAL_COMMUNITY): Payer: Self-pay

## 2012-04-05 ENCOUNTER — Emergency Department (HOSPITAL_COMMUNITY): Payer: Medicare HMO

## 2012-04-05 DIAGNOSIS — G4700 Insomnia, unspecified: Secondary | ICD-10-CM | POA: Diagnosis present

## 2012-04-05 DIAGNOSIS — R269 Unspecified abnormalities of gait and mobility: Secondary | ICD-10-CM | POA: Diagnosis present

## 2012-04-05 DIAGNOSIS — R7881 Bacteremia: Secondary | ICD-10-CM

## 2012-04-05 DIAGNOSIS — J159 Unspecified bacterial pneumonia: Secondary | ICD-10-CM | POA: Insufficient documentation

## 2012-04-05 DIAGNOSIS — E875 Hyperkalemia: Secondary | ICD-10-CM | POA: Diagnosis not present

## 2012-04-05 DIAGNOSIS — A4151 Sepsis due to Escherichia coli [E. coli]: Principal | ICD-10-CM | POA: Diagnosis present

## 2012-04-05 DIAGNOSIS — M1710 Unilateral primary osteoarthritis, unspecified knee: Secondary | ICD-10-CM | POA: Diagnosis present

## 2012-04-05 DIAGNOSIS — M810 Age-related osteoporosis without current pathological fracture: Secondary | ICD-10-CM | POA: Diagnosis present

## 2012-04-05 DIAGNOSIS — E1142 Type 2 diabetes mellitus with diabetic polyneuropathy: Secondary | ICD-10-CM | POA: Diagnosis present

## 2012-04-05 DIAGNOSIS — M48061 Spinal stenosis, lumbar region without neurogenic claudication: Secondary | ICD-10-CM

## 2012-04-05 DIAGNOSIS — N10 Acute tubulo-interstitial nephritis: Secondary | ICD-10-CM | POA: Diagnosis present

## 2012-04-05 DIAGNOSIS — N390 Urinary tract infection, site not specified: Secondary | ICD-10-CM

## 2012-04-05 DIAGNOSIS — N179 Acute kidney failure, unspecified: Secondary | ICD-10-CM | POA: Diagnosis present

## 2012-04-05 DIAGNOSIS — A419 Sepsis, unspecified organism: Secondary | ICD-10-CM | POA: Diagnosis present

## 2012-04-05 DIAGNOSIS — E785 Hyperlipidemia, unspecified: Secondary | ICD-10-CM | POA: Diagnosis present

## 2012-04-05 DIAGNOSIS — N183 Chronic kidney disease, stage 3 unspecified: Secondary | ICD-10-CM | POA: Diagnosis present

## 2012-04-05 DIAGNOSIS — I10 Essential (primary) hypertension: Secondary | ICD-10-CM | POA: Diagnosis present

## 2012-04-05 DIAGNOSIS — R062 Wheezing: Secondary | ICD-10-CM

## 2012-04-05 DIAGNOSIS — E669 Obesity, unspecified: Secondary | ICD-10-CM | POA: Diagnosis present

## 2012-04-05 DIAGNOSIS — I872 Venous insufficiency (chronic) (peripheral): Secondary | ICD-10-CM | POA: Diagnosis present

## 2012-04-05 DIAGNOSIS — N189 Chronic kidney disease, unspecified: Secondary | ICD-10-CM | POA: Diagnosis present

## 2012-04-05 DIAGNOSIS — G2581 Restless legs syndrome: Secondary | ICD-10-CM | POA: Diagnosis present

## 2012-04-05 DIAGNOSIS — W19XXXA Unspecified fall, initial encounter: Secondary | ICD-10-CM | POA: Diagnosis present

## 2012-04-05 DIAGNOSIS — J189 Pneumonia, unspecified organism: Secondary | ICD-10-CM | POA: Diagnosis present

## 2012-04-05 DIAGNOSIS — E1149 Type 2 diabetes mellitus with other diabetic neurological complication: Secondary | ICD-10-CM | POA: Diagnosis present

## 2012-04-05 HISTORY — DX: Unspecified asthma, uncomplicated: J45.909

## 2012-04-05 HISTORY — DX: Type 2 diabetes mellitus without complications: E11.9

## 2012-04-05 HISTORY — DX: Polyneuropathy, unspecified: G62.9

## 2012-04-05 HISTORY — DX: Essential (primary) hypertension: I10

## 2012-04-05 LAB — URINALYSIS, ROUTINE W REFLEX MICROSCOPIC
Glucose, UA: 250 mg/dL — AB
Ketones, ur: 15 mg/dL — AB
Nitrite: POSITIVE — AB
Protein, ur: 30 mg/dL — AB
Specific Gravity, Urine: 1.02 (ref 1.005–1.030)
Urobilinogen, UA: 1 mg/dL (ref 0.0–1.0)
pH: 5 (ref 5.0–8.0)

## 2012-04-05 LAB — COMPREHENSIVE METABOLIC PANEL
ALT: 40 U/L — ABNORMAL HIGH (ref 0–35)
AST: 55 U/L — ABNORMAL HIGH (ref 0–37)
Albumin: 3.9 g/dL (ref 3.5–5.2)
Alkaline Phosphatase: 87 U/L (ref 39–117)
BUN: 28 mg/dL — ABNORMAL HIGH (ref 6–23)
CO2: 31 mEq/L (ref 19–32)
Calcium: 10.2 mg/dL (ref 8.4–10.5)
Chloride: 96 mEq/L (ref 96–112)
Creatinine, Ser: 1.92 mg/dL — ABNORMAL HIGH (ref 0.50–1.10)
GFR calc Af Amer: 28 mL/min — ABNORMAL LOW (ref >90)
GFR calc non Af Amer: 24 mL/min — ABNORMAL LOW (ref >90)
Glucose, Bld: 314 mg/dL — ABNORMAL HIGH (ref 70–99)
Potassium: 4.7 mEq/L (ref 3.5–5.1)
Sodium: 139 mEq/L (ref 135–145)
Total Bilirubin: 0.4 mg/dL (ref 0.3–1.2)
Total Protein: 7.4 g/dL (ref 6.0–8.3)

## 2012-04-05 LAB — CREATININE, SERUM
Creatinine, Ser: 2.22 mg/dL — ABNORMAL HIGH (ref 0.50–1.10)
GFR calc Af Amer: 24 mL/min — ABNORMAL LOW (ref >90)
GFR calc non Af Amer: 20 mL/min — ABNORMAL LOW (ref >90)

## 2012-04-05 LAB — CBC WITH DIFFERENTIAL/PLATELET
Basophils Absolute: 0 10*3/uL (ref 0.0–0.1)
Basophils Relative: 0 % (ref 0–1)
Eosinophils Absolute: 0 10*3/uL (ref 0.0–0.7)
Eosinophils Relative: 0 % (ref 0–5)
HCT: 37.3 % (ref 36.0–46.0)
Hemoglobin: 11.9 g/dL — ABNORMAL LOW (ref 12.0–15.0)
Lymphocytes Relative: 9 % — ABNORMAL LOW (ref 12–46)
Lymphs Abs: 1.1 10*3/uL (ref 0.7–4.0)
MCH: 29.7 pg (ref 26.0–34.0)
MCHC: 31.9 g/dL (ref 30.0–36.0)
MCV: 93 fL (ref 78.0–100.0)
Monocytes Absolute: 0.1 10*3/uL (ref 0.1–1.0)
Monocytes Relative: 1 % — ABNORMAL LOW (ref 3–12)
Neutro Abs: 10.2 10*3/uL — ABNORMAL HIGH (ref 1.7–7.7)
Neutrophils Relative %: 89 % — ABNORMAL HIGH (ref 43–77)
Platelets: 186 10*3/uL (ref 150–400)
RBC: 4.01 MIL/uL (ref 3.87–5.11)
RDW: 16.8 % — ABNORMAL HIGH (ref 11.5–15.5)
WBC: 11.4 10*3/uL — ABNORMAL HIGH (ref 4.0–10.5)

## 2012-04-05 LAB — GLUCOSE, CAPILLARY: Glucose-Capillary: 282 mg/dL — ABNORMAL HIGH (ref 70–99)

## 2012-04-05 LAB — URINE MICROSCOPIC-ADD ON

## 2012-04-05 LAB — CBC
HCT: 32.3 % — ABNORMAL LOW (ref 36.0–46.0)
Hemoglobin: 10 g/dL — ABNORMAL LOW (ref 12.0–15.0)
MCH: 29.2 pg (ref 26.0–34.0)
MCHC: 31 g/dL (ref 30.0–36.0)
MCV: 94.2 fL (ref 78.0–100.0)
Platelets: 144 K/uL — ABNORMAL LOW (ref 150–400)
RBC: 3.43 MIL/uL — ABNORMAL LOW (ref 3.87–5.11)
RDW: 17 % — ABNORMAL HIGH (ref 11.5–15.5)
WBC: 15.6 10*3/uL — ABNORMAL HIGH (ref 4.0–10.5)

## 2012-04-05 LAB — LACTIC ACID, PLASMA: Lactic Acid, Venous: 4.3 mmol/L — ABNORMAL HIGH (ref 0.5–2.2)

## 2012-04-05 MED ORDER — ACETAMINOPHEN 650 MG RE SUPP: 650.0000 mg | Freq: Four times a day (QID) | RECTAL | Status: DC | PRN

## 2012-04-05 MED ORDER — ALENDRONATE SODIUM 70 MG PO TABS: 70.0000 mg | ORAL_TABLET | ORAL | Status: DC

## 2012-04-05 MED ORDER — CALCIUM CARBONATE-VITAMIN D 500-200 MG-UNIT PO TABS
1.0000 | ORAL_TABLET | Freq: Three times a day (TID) | ORAL | Status: DC
  Administered 2012-04-05 – 2012-04-09 (×12): 1 via ORAL
  Filled 2012-04-05 (×13): qty 1

## 2012-04-05 MED ORDER — PREGABALIN 50 MG PO CAPS
100.0000 mg | ORAL_CAPSULE | ORAL | Status: AC
  Administered 2012-04-05: 100 mg via ORAL
  Filled 2012-04-05: qty 2

## 2012-04-05 MED ORDER — CITALOPRAM HYDROBROMIDE 40 MG PO TABS
40.0000 mg | ORAL_TABLET | Freq: Every day | ORAL | Status: DC
  Administered 2012-04-05 – 2012-04-09 (×5): 40 mg via ORAL
  Filled 2012-04-05 (×5): qty 1

## 2012-04-05 MED ORDER — ONDANSETRON HCL 4 MG/2ML IJ SOLN: 4.0000 mg | Freq: Four times a day (QID) | INTRAMUSCULAR | Status: DC | PRN

## 2012-04-05 MED ORDER — ALBUTEROL SULFATE HFA 108 (90 BASE) MCG/ACT IN AERS
2.0000 | INHALATION_SPRAY | Freq: Four times a day (QID) | RESPIRATORY_TRACT | Status: DC | PRN
  Administered 2012-04-05 – 2012-04-09 (×7): 2 via RESPIRATORY_TRACT
  Filled 2012-04-05 (×2): qty 6.7

## 2012-04-05 MED ORDER — HEPARIN SODIUM (PORCINE) 5000 UNIT/ML IJ SOLN
5000.0000 [IU] | Freq: Three times a day (TID) | INTRAMUSCULAR | Status: DC
  Administered 2012-04-05 – 2012-04-09 (×12): 5000 [IU] via SUBCUTANEOUS
  Filled 2012-04-05 (×15): qty 1

## 2012-04-05 MED ORDER — LEVOFLOXACIN IN D5W 750 MG/150ML IV SOLN
750.0000 mg | INTRAVENOUS | Status: DC
  Administered 2012-04-06: 750 mg via INTRAVENOUS
  Filled 2012-04-05: qty 150

## 2012-04-05 MED ORDER — METOPROLOL SUCCINATE ER 25 MG PO TB24
25.0000 mg | ORAL_TABLET | Freq: Every day | ORAL | Status: DC
  Administered 2012-04-06 – 2012-04-09 (×4): 25 mg via ORAL
  Filled 2012-04-05 (×5): qty 1

## 2012-04-05 MED ORDER — INSULIN ASPART 100 UNIT/ML ~~LOC~~ SOLN
0.0000 [IU] | Freq: Three times a day (TID) | SUBCUTANEOUS | Status: DC
  Administered 2012-04-06: 5 [IU] via SUBCUTANEOUS
  Administered 2012-04-06: 2 [IU] via SUBCUTANEOUS
  Administered 2012-04-06: 5 [IU] via SUBCUTANEOUS
  Administered 2012-04-07: 7 [IU] via SUBCUTANEOUS
  Administered 2012-04-07 (×2): 3 [IU] via SUBCUTANEOUS
  Administered 2012-04-08: 5 [IU] via SUBCUTANEOUS
  Administered 2012-04-08 – 2012-04-09 (×3): 3 [IU] via SUBCUTANEOUS
  Administered 2012-04-09: 5 [IU] via SUBCUTANEOUS
  Administered 2012-04-09: 3 [IU] via SUBCUTANEOUS

## 2012-04-05 MED ORDER — ALLOPURINOL 100 MG PO TABS
200.0000 mg | ORAL_TABLET | Freq: Every day | ORAL | Status: DC
Start: 2012-04-05 — End: 2012-04-09
  Administered 2012-04-05 – 2012-04-09 (×5): 200 mg via ORAL
  Filled 2012-04-05 (×5): qty 2

## 2012-04-05 MED ORDER — FLUTICASONE PROPIONATE 50 MCG/ACT NA SUSP
2.0000 | Freq: Every day | NASAL | Status: DC
  Administered 2012-04-06 – 2012-04-09 (×4): 2 via NASAL
  Filled 2012-04-05: qty 16

## 2012-04-05 MED ORDER — ONDANSETRON HCL 4 MG PO TABS
4.0000 mg | ORAL_TABLET | Freq: Four times a day (QID) | ORAL | Status: DC | PRN
  Filled 2012-04-05: qty 1

## 2012-04-05 MED ORDER — SENNOSIDES 8.6 MG PO TABS: 1.0000 | ORAL_TABLET | Freq: Every day | ORAL | Status: DC

## 2012-04-05 MED ORDER — SIMVASTATIN 20 MG PO TABS
20.0000 mg | ORAL_TABLET | Freq: Every day | ORAL | Status: DC
  Administered 2012-04-05 – 2012-04-08 (×4): 20 mg via ORAL
  Filled 2012-04-05 (×5): qty 1

## 2012-04-05 MED ORDER — SENNA 8.6 MG PO TABS
1.0000 | ORAL_TABLET | Freq: Every day | ORAL | Status: DC
  Administered 2012-04-05 – 2012-04-09 (×5): 8.6 mg via ORAL
  Filled 2012-04-05 (×6): qty 1

## 2012-04-05 MED ORDER — LEVOFLOXACIN IN D5W 750 MG/150ML IV SOLN
750.0000 mg | INTRAVENOUS | Status: DC
  Administered 2012-04-05: 750 mg via INTRAVENOUS
  Filled 2012-04-05: qty 150

## 2012-04-05 MED ORDER — ASPIRIN 325 MG PO TABS
325.0000 mg | ORAL_TABLET | Freq: Every day | ORAL | Status: DC
Start: 2012-04-05 — End: 2012-04-09
  Administered 2012-04-05 – 2012-04-09 (×5): 325 mg via ORAL
  Filled 2012-04-05 (×5): qty 1

## 2012-04-05 MED ORDER — ACETAMINOPHEN 325 MG PO TABS
650.0000 mg | ORAL_TABLET | Freq: Four times a day (QID) | ORAL | Status: DC | PRN
  Administered 2012-04-07 – 2012-04-09 (×2): 650 mg via ORAL
  Filled 2012-04-05 (×4): qty 2

## 2012-04-05 MED ORDER — ACETAMINOPHEN 325 MG PO TABS
975.0000 mg | ORAL_TABLET | Freq: Once | ORAL | Status: AC
  Administered 2012-04-05: 975 mg via ORAL
  Filled 2012-04-05: qty 3

## 2012-04-05 MED ORDER — PREGABALIN 75 MG PO CAPS
150.0000 mg | ORAL_CAPSULE | Freq: Every day | ORAL | Status: DC
  Filled 2012-04-05: qty 2

## 2012-04-05 MED ORDER — TRAMADOL-ACETAMINOPHEN 37.5-325 MG PO TABS
1.0000 | ORAL_TABLET | Freq: Four times a day (QID) | ORAL | Status: DC | PRN
  Administered 2012-04-08: 1 via ORAL
  Filled 2012-04-05 (×2): qty 1

## 2012-04-05 MED ORDER — OMEGA-3-ACID ETHYL ESTERS 1 G PO CAPS
1.0000 g | ORAL_CAPSULE | Freq: Every day | ORAL | Status: DC
  Administered 2012-04-05 – 2012-04-09 (×5): 1 g via ORAL
  Filled 2012-04-05 (×5): qty 1

## 2012-04-05 MED ORDER — SODIUM CHLORIDE 0.45 % IV SOLN
INTRAVENOUS | Status: DC
  Administered 2012-04-05 – 2012-04-07 (×3): via INTRAVENOUS

## 2012-04-05 MED ORDER — INSULIN ASPART 100 UNIT/ML ~~LOC~~ SOLN
0.0000 [IU] | Freq: Every day | SUBCUTANEOUS | Status: DC
  Administered 2012-04-05: 4 [IU] via SUBCUTANEOUS
  Administered 2012-04-06: 2 [IU] via SUBCUTANEOUS
  Administered 2012-04-07: 4 [IU] via SUBCUTANEOUS
  Administered 2012-04-08: 2 [IU] via SUBCUTANEOUS

## 2012-04-05 MED ORDER — ROPINIROLE HCL 1 MG PO TABS
4.0000 mg | ORAL_TABLET | ORAL | Status: AC
Start: 2012-04-05 — End: 2012-04-05
  Administered 2012-04-05: 4 mg via ORAL
  Filled 2012-04-05 (×2): qty 4

## 2012-04-05 NOTE — Progress Notes (Signed)
PHARMACIST - PHYSICIAN COMMUNICATION  CONCERNING: P&T Medication Policy Regarding Oral Bisphosphonates  RECOMMENDATION: Your order for alendronate (Fosamax), ibandronate (Boniva), or risedronate (Actonel) has been discontinued at this time.  If the patient's post-hospital medical condition warrants safe use of this class of drugs, please resume the pre-hospital regimen upon discharge.  DESCRIPTION:  Alendronate (Fosamax), ibandronate (Boniva), and risedronate (Actonel) can cause severe esophageal erosions in patients who are unable to remain upright at least 30 minutes after taking this medication.   Since brief interruptions in therapy are thought to have minimal impact on bone mineral density, the Pharmacy & Therapeutics Committee has established that bisphosphonate orders should be routinely discontinued during hospitalization.   To override this safety policy and permit administration of Boniva, Fosamax, or Actonel in the hospital, prescribers must write "DO NOT HOLD" in the comments section when placing the order for this class of medications.  Isiah Scheel, PharmD, BCPS Clinical Pharmacist [phone]  

## 2012-04-05 NOTE — ED Provider Notes (Signed)
Medical screening examination/treatment/procedure(s) were conducted as a shared visit with non-physician practitioner(s) and myself.  I personally evaluated the patient during the encounter   PT with temperature, early sepsis with elevated lactic acid of 2.9.  UTI, possibly infiltrate on CXR with coughing at home, will need IV abx.  No sig mentation changes.  Will call code sepsis, but appropriate for step down initially.  Normotensive.    Gavin Pound. Keisi Eckford, MD 04/05/12 1534

## 2012-04-05 NOTE — ED Notes (Signed)
Pt shaking and saying she is having chills. Temperature rechecked. Daughter at the bedside. CBG rechecked 282.

## 2012-04-05 NOTE — ED Notes (Signed)
Per [facility], pt presents with laceration to left lower extremity after attempting to try to get into the shower. Pt lost balance d/t neuropathy in both feet and cut her leg on something metal in the shower.

## 2012-04-05 NOTE — H&P (Signed)
Family Medicine Teaching [facility] Admission History and Physical  Patient name: [name] [name] Medical record number: [id number] Date of birth: 1933/08/14 Age: 78 y.o. Gender: female  Primary Care Provider: Tobin Chad, MD  Chief Complaint: Fall  History of Present Illness: [name] [name] is a 78 y.o. female presenting with a witnessed fall at home and fever in the ED.  Patient appears anxious during encounter, so history is provided by daughter.  Yesterday patient experienced chills, diaphoresis, nausea. Last night she "slipped out of bed". Around 2:50am the daughter found her on the bedroom floor. Patient states that she had to go to the bathroom and her legs became weak and she slid on to the floor. Patient denies any LOC.  Denies any head injury.  She has a history of restless leg syndrome and chronic lower extremity weakness, but daughter says weakness has been getting worse.  Denies any bowel incontinence, but she does have bladder incontinence at baseline.    Around 9:00am this morning her daughter tried to help her get into the shower but her feet gave out and she fell. Patient and her daughter state that she had difficulty lifting her feet and ambulating. Daughter states that this has been a chronic problem and that it is becoming more frequent.  She injured her RT leg in the shower, but denies any activity bleeding or pain at this time.  Patient ambulates with a walker. Patient lives with her 3 daughters who help to care for her and alternates between their homes. Daughter states that patient is usually much more aware and alert than she appears in the ED. At baseline, daughter states that patient is able to answer questions and speak in full sentences which she is not able to do in the ED.  Patient endorses a "wet" sounding cough and wheezing. Denies any productive cough. Patient also endorses rhinorrhea, chills, diaphoresis, nausea, and dry heaving. Patient denies any  chest pain, abdominal pain, back pain, dysuria, polyuria, diarrhea, consitpation, or vomiting.   Patient Active Problem List  Diagnosis  . DIABETES MELLITUS, TYPE II, CONTROLLED, W/NEURO COMPS  . HYPERLIPIDEMIA  . OBESITY, NOS  . ANEMIA, PERNICIOUS  . DEPRESSIVE DISORDER, NOS  . RESTLESS LEGS SYNDROME  . HYPERTENSION, BENIGN SYSTEMIC  . VENOUS INSUFFICIENCY, CHRONIC  . GASTROESOPHAGEAL REFLUX, NO ESOPHAGITIS  . Rosacea  . OSTEOARTHRITIS, LOWER LEG  . SPINAL STENOSIS, LUMBAR  . OSTEOPOROSIS  . INSOMNIA NOS  . APNEA, SLEEP  . GAIT DISTURBANCE  . URINARY INCONTINENCE  . CHRONIC GOUTY ARTHROPATHY W/O MENTION TOPHUS  . Kidney disease, chronic, stage III (GFR 30-59 ml/min)   Past Medical History: Past Medical History  Diagnosis Date  . History of cystoscopy 08/2005    normal  . Asthma   . Hypertension   . Diabetes mellitus without complication   . Neuropathy    Past Surgical History: Past Surgical History  Procedure Date  . Cataract extraction w/ intraocular lens implant 2012    bilateral  . Total knee arthroplasty 02/2010    left   Social History: History   Social History  . Marital Status: Widowed    Spouse Name: Onalee Hua    Number of Children: 6  . Years of Education: 8   Occupational History  .     Social History Main Topics  . Smoking status: Passive Smoke Exposure - Never Smoker  . Smokeless tobacco: None  . Alcohol Use: No  . Drug Use: No  . Sexually Active: None  Other Topics Concern  . None   Social History Narrative   Widowed 1997Son living with her, also grandson, Farrell Ours, died in her home of a drug OD 9/01Her daughter and his mother died of alcoholism   Family History: Family History  Problem Relation Age of Onset  . Alcohol abuse Son   . Alcohol abuse Daughter     died  . Diabetes Mother   . Diabetes Daughter   . Diabetes Sister   . Coronary artery disease Sister     MI  . Alzheimer's disease Sister     died of heart  proble  . Diabetes Daughter   . Diabetes Daughter   . Diabetes Son   . Diabetes Son    Allergies: Allergies  Allergen Reactions  . Cephalexin     REACTION: oral ulcerations, vomiting diarrhea  . Enalapril Maleate     REACTION: Facial swelling and urticaria and blisters on lips  . Mirtazapine     REACTION: edema per patient   Current Facility-Administered Medications  Medication Dose Route Frequency Provider Last Rate Last Dose  . acetaminophen (TYLENOL) tablet 975 mg  975 mg Oral Once [employer], PA-C   975 mg at 04/05/12 1415  . cyanocobalamin ((VITAMIN B-12)) injection 1,000 mcg  1,000 mcg Intramuscular Once Tobin Chad, MD      . levofloxacin [facility]) IVPB 750 mg  750 mg Intravenous Q24H Carlyle Dolly, PA-C       Current Outpatient Prescriptions  Medication Sig Dispense Refill  . albuterol (PROVENTIL HFA;VENTOLIN HFA) 108 (90 BASE) MCG/ACT inhaler Inhale 2 puffs into the lungs every 6 (six) hours as needed for wheezing.  1 Inhaler  0  . alendronate (FOSAMAX) 70 MG tablet Take 70 mg by mouth every 7 (seven) days.  Take in the morning with a full glass of water, on an empty stomach, and do not take anything else by mouth or lie down for the next 30 min.      Marland Kitchen allopurinol (ZYLOPRIM) 100 MG tablet Take 2 tablets (200 mg total) by mouth daily.  180 tablet  3  . aspirin (BAYER ASPIRIN) 325 MG tablet Take 325 mg by mouth daily.       . calcium-vitamin D (CALCIUM 500/D) 500-200 MG-UNIT per tablet Take 1 tablet by mouth 3 (three) times daily.       . citalopram (CELEXA) 40 MG tablet Take 40 mg by mouth daily.      Marland Kitchen COLCRYS 0.6 MG tablet TAKE ONE (1) TABLET(S) BY MOUTH DAILY  90 tablet  3  . fluticasone (FLONASE) 50 MCG/ACT nasal spray 2 sprays by Nasal route daily.       . furosemide (LASIX) 40 MG tablet Take 1 tablet (40 mg total) by mouth daily.  30 tablet  11  . glipiZIDE (GLUCOTROL) 5 MG tablet TAKE ONE (1) TABLET(S) DAILY  32 tablet  11  . metFORMIN  (GLUCOPHAGE) 500 MG tablet TAKE TWO TABLETS BY MOUTH TWICE DAILY WITH MEALS IN THE MORNING & EVENING  120 tablet  5  . metoprolol succinate (TOPROL-XL) 25 MG 24 hr tablet TAKE ONE (1) TABLET(S) BY MOUTH DAILY  90 tablet  3  . Omega-3 Fatty Acids (FISH OIL) 1000 MG CAPS Ran out (no other instructions)       . pregabalin (LYRICA) 150 MG capsule Take 1 capsule (150 mg total) by mouth daily.  30 capsule  11  . rOPINIRole (REQUIP) 4 MG tablet TAKE ONE TABLET  BY MOUTH AT BEDTIME  30 tablet  11  . senna (SENOKOT) 8.6 MG tablet Take 1-2 tablets by mouth daily. for constipation      . simvastatin (ZOCOR) 20 MG tablet Take 1 tablet (20 mg total) by mouth at bedtime.  30 tablet  6  . traMADol-acetaminophen (ULTRACET) 37.5-325 MG per tablet Take 1 tablet by mouth every 6 (six) hours as needed. For pain      . VESICARE 5 MG tablet TAKE ONE TABLET BY MOUTH ONE TIME DAILY  30 tablet  11  . DISCONTD: metFORMIN (GLUCOPHAGE) 500 MG tablet Take 2 tabs in AM and one in PM  120 tablet  11    Review Of Systems: Per HPI   Otherwise 12 point review of systems was performed and was unremarkable.  Physical Exam: Filed Vitals:   04/05/12 1507  BP:   Pulse:   Temp: 100.3 F (37.9 C)  Resp:    Gen:  Elderly female anxious appearing laying in bed HEENT: Moist mucous membranes. EOMI. No scleral icterus.  CV: Regular rate and rhythm, no murmurs rubs or gallops PULM: Clear to auscultation bilaterally. No wheezes/rales/rhonchi. ?diminished breath sounds of R lower base? ABD: Soft, non tender, mildly distended, normal bowel sounds. EXT: Bruising of the dorsum of R hand. Diffuse ecchymosis of R shin. 5cm superficial linear abrasion of L shin. No edema.  Neuro: Alert and oriented x3. No focal motor or sensory deficits.  Labs and Imaging: Lab Results  Component Value Date/Time   NA 139 04/05/2012 12:31 PM   K 4.7 04/05/2012 12:31 PM   CL 96 04/05/2012 12:31 PM   CO2 31 04/05/2012 12:31 PM   BUN 28* 04/05/2012 12:31  PM   CREATININE 1.92* 04/05/2012 12:31 PM   CREATININE 1.37* 12/26/2011  4:16 PM   GLUCOSE 314* 04/05/2012 12:31 PM   Lab Results  Component Value Date   WBC 11.4* 04/05/2012   HGB 11.9* 04/05/2012   HCT 37.3 04/05/2012   MCV 93.0 04/05/2012   PLT 186 04/05/2012    Assessment and Plan: [name] [name] is a 78 y.o.  female presenting with fever in ED, cough, congestion and witnessed fall at home.  Found to have possible consolidation on CXR and UTI. 1. Infectious Disease - Patient is febrile in the ED with a max temp of 102.3 and WBC 11.4. Patient was started on levofloxacin 750mg  qd. Patient has positive sick contacts (son-in-law has pneumonia). Patient's UA was positive nitrites, moderate leukocytes, and 11-20 WBC. A portable CXR showed atelectasis and/or consolidation. The source of this infection is UTI versus pneumonia. We will continue levofloxacin until the source of the infection is clear.       -- Continue levofloxacin to cover both presumed CAP and UTI       -- am CXR 2V       -- Urine culture and blood culture (obtained in ED) pending       -- Foley placed in ED due to urinary incontinence 2. Fall - Patient has a history of falls which are attributed to having difficulty moving her legs. The patient has a history of restless leg syndrome and chronic weakness which are followed by her PCP. There was concern for cervical myelopathy.       -- Consider MRI to evaluate for cervical myelopathy per previous PCP office visit.       -- Consult PT/OT for deconditioning and discharge planning       -- Will continue Requip  and Lyrica (home medications) for Restless Leg Syndrome       -- Up with assistance only 3. Acute on Chronic Kidney Disease - Patient has CKD stage 3. On admission she has a creatinine of 1.92 which is above her baseline of 1.3-1.4. Her EF is 60-65%. Likely due to dehydration/fever.       -- MIVF 1/2NS @ 193mL/hr       -- Repeat BMET in AM 4. Diabetes Type 2 - Patient is  on glipizide and metformin at home.  Last A1c in clinic was 7.4.        -- Hold home glipizide and metformin        -- Sensitive SSI 5. Hyperlipidemia: Patient is on simvastatin 20mg  daily       -- Continue home simvastatin 20mg  daily 6. Depressive Disorder       -- Continue home celexa 40mg  daily 7. Restless leg syndrome       -- Continue home Requip and lyrica       -- Follow up PT recommendations 8. Hypertension       -- Continue home metoprolol succinate 25mg  qd 9. Sleep Apnea: will consider adding CPAP if needed 10. FEN/GI: carb modified, 1/2 NS @ 125 cc/hr 11. Prophylaxis: Hep sub q 12. Disposition: Admit to med-surg floor; to home vs. assisted living pending PT/OT recommendations.    Regino Bellow   PGY 3 ADDENDUM:  Patient seen, examined independently.  Available data reviewed. Agree with findings, assessment, and plan as outlined by Regino Bellow MS IV.  My additional findings are documented and highlighted above.  Gen:  Elderly female anxious appearing laying in bed, moving bilateral lower extremities HEENT: Moist mucous membranes. EOMI. No scleral icterus.  CV: Regular rate and rhythm, no murmurs rubs or gallops PULM: CTAB. No wheezes/rales/rhonchi. Diminished breath sounds of R lower base, but no rales or rhonchi. ABD: Soft, non tender, mildly distended, + BS. EXT: Bruising of the dorsum of R hand. Diffuse ecchymosis of R shin. 5cm superficial linear abrasion of L shin.  No cyanosis, clubbing, or edema. Neuro: Alert and oriented x3. No focal motor or sensory deficits.  Assessment/Plan: As above.  Tarri [employer], DO 04/05/2012 4:39 PM

## 2012-04-05 NOTE — ED Provider Notes (Signed)
History     CSN: [id number]  Arrival date & time 04/05/12  1100   First MD Initiated Contact with Patient 04/05/12 1121      Chief Complaint  Patient presents with  . Extremity Laceration    (Consider location/radiation/quality/duration/timing/severity/associated sxs/prior treatment) HPI The patient presents to the emergency department with a skin tear following a fall this morning.  The history is obtained by the patient and her daughter who witnessed the fall.  The patient has a history of peripheral neuropathy, leg weakness, and falls.  The patient was stepping into the shower and was unable to lift her leg.  Her daughter tried to catch her fall but the patient hit her left anterior leg on the metal frame of the shower.  She reports leg weakness at the time of the fall.  She denies head injury, loc, dizziness, syncope, change in vision, leg pain, and numbness.     Past Medical History  Diagnosis Date  . History of cystoscopy 08/2005    normal  . Asthma   . Hypertension   . Diabetes mellitus without complication   . Neuropathy     Past Surgical History  Procedure Date  . Cataract extraction w/ intraocular lens implant 2012    bilateral  . Total knee arthroplasty 02/2010    left    Family History  Problem Relation Age of Onset  . Alcohol abuse Son   . Alcohol abuse Daughter     died  . Diabetes Mother   . Diabetes Daughter   . Diabetes Sister   . Coronary artery disease Sister     MI  . Alzheimer's disease Sister     died of heart proble  . Diabetes Daughter   . Diabetes Daughter   . Diabetes Son   . Diabetes Son     History  Substance Use Topics  . Smoking status: Passive Smoke Exposure - Never Smoker  . Smokeless tobacco: Not on file  . Alcohol Use: No    OB History    Grav Para Term Preterm Abortions TAB SAB Ect Mult Living                  Review of Systems All pertinent positives and negatives in the history of present illness.  Allergies    Cephalexin; Enalapril maleate; and Mirtazapine  Home Medications   Current Outpatient Rx  Name Route Sig Dispense Refill  . ALBUTEROL SULFATE HFA 108 (90 BASE) MCG/ACT IN AERS Inhalation Inhale 2 puffs into the lungs every 6 (six) hours as needed for wheezing. 1 Inhaler 0  . ALENDRONATE SODIUM 70 MG PO TABS Oral Take 70 mg by mouth every 7 (seven) days.  Take in the morning with a full glass of water, on an empty stomach, and do not take anything else by mouth or lie down for the next 30 min.    Marland Kitchen ALLOPURINOL 100 MG PO TABS Oral Take 2 tablets (200 mg total) by mouth daily. 180 tablet 3  . ASPIRIN 325 MG PO TABS Oral Take 325 mg by mouth daily.     Marland Kitchen CALCIUM CARBONATE-VITAMIN D 500-200 MG-UNIT PO TABS Oral Take 1 tablet by mouth 3 (three) times daily.     Marland Kitchen CITALOPRAM HYDROBROMIDE 40 MG PO TABS Oral Take 40 mg by mouth daily.    Marland Kitchen COLCRYS 0.6 MG PO TABS  TAKE ONE (1) TABLET(S) BY MOUTH DAILY 90 tablet 3  . FLUTICASONE PROPIONATE 50 MCG/ACT NA SUSP Nasal 2  sprays by Nasal route daily.     . FUROSEMIDE 40 MG PO TABS Oral Take 1 tablet (40 mg total) by mouth daily. 30 tablet 11  . GLIPIZIDE 5 MG PO TABS  TAKE ONE (1) TABLET(S) DAILY 32 tablet 11  . METFORMIN HCL 500 MG PO TABS  TAKE TWO TABLETS BY MOUTH TWICE DAILY WITH MEALS IN THE MORNING & EVENING 120 tablet 5  . METOPROLOL SUCCINATE ER 25 MG PO TB24  TAKE ONE (1) TABLET(S) BY MOUTH DAILY 90 tablet 3  . FISH OIL 1000 MG PO CAPS  Ran out (no other instructions)     . PREGABALIN 150 MG PO CAPS Oral Take 1 capsule (150 mg total) by mouth daily. 30 capsule 11  . ROPINIROLE HCL 4 MG PO TABS  TAKE ONE TABLET BY MOUTH AT BEDTIME 30 tablet 11  . SENNOSIDES 8.6 MG PO TABS Oral Take 1-2 tablets by mouth daily. for constipation    . SIMVASTATIN 20 MG PO TABS Oral Take 1 tablet (20 mg total) by mouth at bedtime. 30 tablet 6  . TRAMADOL-ACETAMINOPHEN 37.5-325 MG PO TABS Oral Take 1 tablet by mouth every 6 (six) hours as needed. For pain    . VESICARE 5  MG PO TABS  TAKE ONE TABLET BY MOUTH ONE TIME DAILY 30 tablet 11    BP 121/49  Pulse 77  Temp 98.3 F (36.8 C) (Oral)  Resp 20  SpO2 95%  Physical Exam  Constitutional: She is oriented to person, place, and time. She appears well-developed and well-nourished.  HENT:  Head: Normocephalic and atraumatic.  Eyes: Conjunctivae normal and EOM are normal. Pupils are equal, round, and reactive to light.  Neck: Normal range of motion. Neck supple.  Cardiovascular: Normal rate and regular rhythm.   Pulmonary/Chest: Effort normal and breath sounds normal.  Neurological: She is alert and oriented to person, place, and time. She has normal strength. No sensory deficit.  Skin: Skin is warm and dry. Abrasion noted. No rash noted. No erythema. No pallor.       3 cm skin tear on lower left leg    ED Course  Procedures (including critical care time)   The patient will be admitted to the hospital for possible pneumonia and UTI.  Patient's been stable here in the emergency Department other than a fever.  MDM  MDM Reviewed: vitals and nursing note Interpretation: labs, ECG and x-ray Consults: admitting MD    Date: 04/05/2012  Rate: 108  Rhythm: sinus tachycardia  QRS Axis: normal  Intervals: normal  ST/T Wave abnormalities: nonspecific ST/T changes  Conduction Disutrbances:none  Narrative Interpretation:   Old EKG Reviewed: unchanged          Carlyle Dolly, PA-C 04/05/12 1524

## 2012-04-06 ENCOUNTER — Inpatient Hospital Stay (HOSPITAL_COMMUNITY): Payer: Medicare HMO

## 2012-04-06 ENCOUNTER — Encounter (HOSPITAL_COMMUNITY): Payer: Self-pay

## 2012-04-06 DIAGNOSIS — N179 Acute kidney failure, unspecified: Secondary | ICD-10-CM | POA: Diagnosis present

## 2012-04-06 DIAGNOSIS — N390 Urinary tract infection, site not specified: Secondary | ICD-10-CM

## 2012-04-06 DIAGNOSIS — N10 Acute tubulo-interstitial nephritis: Secondary | ICD-10-CM | POA: Diagnosis present

## 2012-04-06 DIAGNOSIS — R7881 Bacteremia: Secondary | ICD-10-CM

## 2012-04-06 LAB — BASIC METABOLIC PANEL WITH GFR
BUN: 36 mg/dL — ABNORMAL HIGH (ref 6–23)
Calcium: 9.3 mg/dL (ref 8.4–10.5)
Creatinine, Ser: 2.56 mg/dL — ABNORMAL HIGH (ref 0.50–1.10)
GFR calc Af Amer: 20 mL/min — ABNORMAL LOW (ref >90)
GFR calc non Af Amer: 17 mL/min — ABNORMAL LOW (ref >90)
Glucose, Bld: 186 mg/dL — ABNORMAL HIGH (ref 70–99)
Potassium: 4.4 meq/L (ref 3.5–5.1)

## 2012-04-06 LAB — BASIC METABOLIC PANEL
CO2: 28 mEq/L (ref 19–32)
Chloride: 99 mEq/L (ref 96–112)
Sodium: 138 mEq/L (ref 135–145)

## 2012-04-06 LAB — CBC
HCT: 30.1 % — ABNORMAL LOW (ref 36.0–46.0)
Hemoglobin: 9.2 g/dL — ABNORMAL LOW (ref 12.0–15.0)
MCH: 28.7 pg (ref 26.0–34.0)
MCHC: 30.6 g/dL (ref 30.0–36.0)
MCV: 93.8 fL (ref 78.0–100.0)
Platelets: 133 10*3/uL — ABNORMAL LOW (ref 150–400)
RBC: 3.21 MIL/uL — ABNORMAL LOW (ref 3.87–5.11)
RDW: 17 % — ABNORMAL HIGH (ref 11.5–15.5)
WBC: 13 10*3/uL — ABNORMAL HIGH (ref 4.0–10.5)

## 2012-04-06 LAB — GLUCOSE, CAPILLARY
Glucose-Capillary: 184 mg/dL — ABNORMAL HIGH (ref 70–99)
Glucose-Capillary: 280 mg/dL — ABNORMAL HIGH (ref 70–99)
Glucose-Capillary: 266 mg/dL — ABNORMAL HIGH (ref 70–99)

## 2012-04-06 LAB — SODIUM, URINE, RANDOM: Sodium, Ur: 17 mEq/L

## 2012-04-06 LAB — CREATININE, URINE, RANDOM: Creatinine, Urine: 179.99 mg/dL

## 2012-04-06 MED ORDER — ROPINIROLE HCL 1 MG PO TABS
4.0000 mg | ORAL_TABLET | Freq: Every day | ORAL | Status: DC
  Administered 2012-04-06 – 2012-04-08 (×3): 4 mg via ORAL
  Filled 2012-04-06 (×4): qty 4

## 2012-04-06 MED ORDER — SODIUM CHLORIDE 0.9 % IV BOLUS (SEPSIS)
500.0000 mL | Freq: Once | INTRAVENOUS | Status: AC
  Administered 2012-04-06: 500 mL via INTRAVENOUS

## 2012-04-06 MED ORDER — DEXTROSE 5 % IV SOLN
1.0000 g | INTRAVENOUS | Status: DC
  Administered 2012-04-06 – 2012-04-07 (×2): 1 g via INTRAVENOUS
  Filled 2012-04-06 (×3): qty 1

## 2012-04-06 MED ORDER — BENZONATATE 100 MG PO CAPS
200.0000 mg | ORAL_CAPSULE | Freq: Three times a day (TID) | ORAL | Status: DC | PRN
  Administered 2012-04-06: 200 mg via ORAL
  Filled 2012-04-06 (×2): qty 2

## 2012-04-06 MED ORDER — PNEUMOCOCCAL VAC POLYVALENT 25 MCG/0.5ML IJ INJ
0.5000 mL | INJECTION | INTRAMUSCULAR | Status: AC
  Administered 2012-04-07: 0.5 mL via INTRAMUSCULAR
  Filled 2012-04-06: qty 0.5

## 2012-04-06 MED ORDER — PREGABALIN 75 MG PO CAPS
150.0000 mg | ORAL_CAPSULE | Freq: Every day | ORAL | Status: DC
  Administered 2012-04-06 – 2012-04-08 (×3): 150 mg via ORAL
  Filled 2012-04-06 (×3): qty 2

## 2012-04-06 NOTE — Evaluation (Signed)
Occupational Therapy Evaluation Patient Details Name: [name] [name] MRN: [id number] DOB: 1933-12-04 Today's Date: 04/06/2012 Time: 7829-5621 OT Time Calculation (min): 34 min  OT Assessment / Plan / Recommendation Clinical Impression  Pt admitted with falls and weakness.  Pt presents with decreased balance and activity tolerance and generalized weakness interfering with ADL and mobility.  Pt was fairly dependent on her family at baseline for ADL/IADL.  Will follow acutely.    OT Assessment  Patient needs continued OT Services    Follow Up Recommendations  Inpatient Rehab    Barriers to Discharge      Equipment Recommendations  None recommended by OT    Recommendations for Other Services Rehab consult  Frequency  Min 2X/week    Precautions / Restrictions Precautions Precautions: Fall Restrictions Weight Bearing Restrictions: No   Pertinent Vitals/Pain No c/o pain    ADL  Eating/Feeding: Simulated;Set up Where Assessed - Eating/Feeding: Chair Grooming: Performed;Wash/dry face;Wash/dry hands;Minimal assistance Where Assessed - Grooming: Supported sitting Upper Body Bathing: Simulated;Maximal assistance Where Assessed - Upper Body Bathing: Unsupported sitting Lower Body Bathing: Simulated;+1 Total assistance Where Assessed - Lower Body Bathing: Supported sit to stand Upper Body Dressing: Simulated;Moderate assistance Where Assessed - Upper Body Dressing: Unsupported sitting Lower Body Dressing: Simulated;+1 Total assistance Where Assessed - Lower Body Dressing: Supported sit to stand Equipment Used: Gait belt;Rolling walker ADL Comments: pt is interested in adaptive equipment to increase independence in self care.    OT Diagnosis: Generalized weakness  OT Problem List: Decreased strength;Decreased activity tolerance;Impaired balance (sitting and/or standing);Decreased knowledge of use of DME or AE;Cardiopulmonary status limiting activity;Obesity;Impaired UE functional  use OT Treatment Interventions: Self-care/ADL training;Energy conservation;DME and/or AE instruction;Patient/family education;Balance training   OT Goals Acute Rehab OT Goals OT Goal Formulation: With patient Time For Goal Achievement: 04/20/12 Potential to Achieve Goals: Good ADL Goals Pt Will Perform Grooming: with supervision;Standing at sink ADL Goal: Grooming - Progress: Goal set today Pt Will Perform Upper Body Bathing: with min assist;Sitting, edge of bed ADL Goal: Upper Body Bathing - Progress: Goal set today Pt Will Perform Lower Body Bathing: with min assist;Sit to stand from bed;with adaptive equipment ADL Goal: Lower Body Bathing - Progress: Goal set today Pt Will Perform Upper Body Dressing: with min assist;Sitting, bed ADL Goal: Upper Body Dressing - Progress: Goal set today Pt Will Perform Lower Body Dressing: with adaptive equipment;Sit to stand from bed;with min assist ADL Goal: Lower Body Dressing - Progress: Goal set today Pt Will Transfer to Toilet: with supervision;Ambulation;Comfort height toilet ADL Goal: Toilet Transfer - Progress: Goal set today Pt Will Perform Toileting - Clothing Manipulation: with supervision;Standing ADL Goal: Toileting - Clothing Manipulation - Progress: Goal set today Pt Will Perform Toileting - Hygiene: with supervision;Sit to stand from 3-in-1/toilet ADL Goal: Toileting - Hygiene - Progress: Goal set today Pt Will Perform Tub/Shower Transfer: Shower transfer;Ambulation;with supervision;Shower seat with back ADL Goal: Web designer - Progress: Goal set today Miscellaneous OT Goals Miscellaneous OT Goal #1: Pt will generalize energy conservation strategies in ADL with min reminders. OT Goal: Miscellaneous Goal #1 - Progress: Goal set today  Visit Information  Last OT Received On: 04/06/12 Assistance Needed: +1    Subjective Data  Subjective: "They help me at home." Patient Stated Goal: Return to PLOF.   Prior  Functioning     Home Living Lives With: Daughter Available Help at Discharge: Family;Available 24 hours/day Type of Home: House Home Access: Ramped entrance Home Layout: One level Bathroom Shower/Tub: Heritage manager  Toilet: Standard Home Adaptive Equipment: Shower chair with back;Walker - rolling;Grab bars in shower Additional Comments: daughters rotate staying at home wtih her to help  Prior Function Level of Independence: Needs assistance Needs Assistance: Bathing;Dressing;Meal Prep;Light Housekeeping;Gait;Transfers Bath: Maximal (pt is unable to bathe her LEs and is assist for peri area) Dressing: Maximal (pt is unable to dress her LEs) Meal Prep: Total Light Housekeeping: Total Gait Assistance: ambulate with RW around her home with supervision Transfer Assistance: min-mingaurdA Able to Take Stairs?: No Driving: No Vocation: Retired Musician: No difficulties Dominant Hand: Right         Vision/Perception     Cognition  Overall Cognitive Status: Appears within functional limits for tasks assessed/performed Arousal/Alertness: Awake/alert Orientation Level: Oriented X4 / Intact Behavior During Session: [facility] for tasks performed    Extremity/Trunk Assessment Right Upper Extremity Assessment RUE ROM/Strength/Tone: Deficits RUE ROM/Strength/Tone Deficits: Longstanding shoulder limitations of unknown origin.  Pt must raise her R UE with her L in order to apply deodorant, wash underneath, dress. RUE Sensation: WFL - Light Touch Left Upper Extremity Assessment LUE ROM/Strength/Tone: WFL for tasks assessed LUE Sensation: WFL - Light Touch   Mobility Bed Mobility  Supine to Sit: 3: Mod assist Details for Bed Mobility Assistance: for LEs, extra time due to fatigue Transfers Transfers: Sit to Stand;Stand to Sit Sit to Stand: 3: Mod assist;Without upper extremity assist;From chair/3-in-1 Stand to Sit: 4: Min assist;To bed;With upper extremity  assist Details for Transfer Assistance: cues for safe hand placement and facilitation to assist in bringing trunk over BOS as well as power up into trunk extension, assist for stability until pt able to get hands onto RW     Shoulder Instructions     Exercise     Balance Balance Balance Assessed: Yes Static Sitting Balance Static Sitting - Balance Support: Bilateral upper extremity supported;No upper extremity supported Static Sitting - Level of Assistance: 5: Stand by assistance     End of Session OT - End of Session Equipment Utilized During Treatment: Other (comment) (02) Activity Tolerance: Patient limited by fatigue Patient left: in bed;with call bell/phone within reach;with bed alarm set;with family/visitor present  GO     Evern Bio 04/06/2012, 3:12 PM [phone]

## 2012-04-06 NOTE — H&P (Signed)
I have seen and examined this patient. I have discussed with Dr Tye Savoy and MS IV Peggye Pitt.  I agree with their findings and plans as documented in their admit note.  Acute Issues 1. Community Acquired Pneumonia, bronchopneumonia - Agree with empiric Levaquin 2. Possible Pyelonephritis - Agree with empiric levaquin -Follow up Urine culture.  3. Falls - History of abnormal gait - Increased tone in legs - Increased reflex in right knee DTR, Zero DTR left knee (S/P TKR) - palpable focal motor movements in distal limbs - Question of possible central or upper motor neuron neurodegenerative process impairing motor function.    -While Patient has known chronic degenrative changes at L1 thru L5 that   may be causing some lumbar spinal stenosis with leg weakness, low   back nor leg pain are prominent patient features of case currently.   - Cervical CT spine 08/11 showed Multilevel cervical spondylosis - Plan:   --Re-evaluate neurologic exam including gait once acute infectious processes are resolving, including sensory testing of limbs, proprioception testing, resting/postural/intentional tremor testing, Clonus & Asterixis testing,  Babinski, Rhomberg, and Lhermitte's sign to look for cervical cord myelopathy.    If there are findings concerning for upper motor neuron findings in legs then consider brain and cervical MRI.  If concern for lower motor neuron findings in legs then consider repeat Lumbar spine MRI to assess for progressive spinal lumbar stenosis.  -- PT consult for gait abnormality assessment and treatment.

## 2012-04-06 NOTE — Progress Notes (Signed)
Saw that Cefepime was ordered for patient, noticed that pt had an allergy listed in chart to Cephalexin (nausea, vomiting, oral ulcers).Jeanene Erb MD [place] just to be safe and clarify that Cefepime is ok to give to patient. MD Durene Cal said to just monitor the patient for the above stated side effects, and call him if any problems arise.   Minor, Yvette Rack

## 2012-04-06 NOTE — Progress Notes (Signed)
See my admit note for today

## 2012-04-06 NOTE — Evaluation (Signed)
Physical Therapy Evaluation Patient Details Name: [name] [name] MRN: [id number] DOB: 1934/05/29 Today's Date: 04/06/2012 Time: 0865-7846 PT Time Calculation (min): 26 min  PT Assessment / Plan / Recommendation Clinical Impression  [name] [name] is 78 y/o female admitted s/p fall with reports of progressive weakness and found to have fever in the ED being treated with antibiotics. Presents to PT today with generalized weakness and decreased activity tolerance impacting her safety and independence with mobility, ADLs and gait. Pt at high risk of falls with 2 in the past 2 weeks. Will benefit physical therapy in the acute setting to address the below deficits so as to maxmize mobility  and facilitate safe d/c.     PT Assessment  Patient needs continued PT services    Follow Up Recommendations  Post acute inpatient    Does the patient have the potential to tolerate intense rehabilitation   Yes, Recommend IP Rehab Screening  Barriers to Discharge        Equipment Recommendations  None recommended by PT    Recommendations for Other Services OT consult   Frequency Min 3X/week    Precautions / Restrictions Precautions Precautions: Fall Restrictions Weight Bearing Restrictions: No   Pertinent Vitals/Pain On 2 L O2 throughout session, increased DOE with all mobility      Mobility  Bed Mobility Bed Mobility: Rolling Left;Left Sidelying to Sit;Sitting - Scoot to [place] of Bed Rolling Left: 4: Min guard Left Sidelying to Sit: 3: Mod assist Sitting - Scoot to Edge of Bed: 4: Min assist (with facilitation at hip) Details for Bed Mobility Assistance: cues for efficiency as pt with increased effort and work of breathing needing cues for rest breaks and to catch her breath, once on her side pt holding onto therapist and facilitation at trunk to bring trunk upright, min facilitation to get hips scooted squarely EOB and several attempts with increased work of breathing Transfers Transfers: Sit  to Stand;Stand to [employer] to Stand: 3: Mod assist;With upper extremity assist;From bed Stand to Sit: To chair/3-in-1;4: Min assist;With armrests Details for Transfer Assistance: cues for safe hand placement and facilitation to assist in bringing trunk over BOS as well as power up into trunk extension, assist for stability until pt able to get hands onto RW Ambulation/Gait Ambulation/Gait Assistance: 4: Min assist Ambulation Distance (Feet): 5 Feet Assistive device: Rolling walker Ambulation/Gait Assistance Details: cues for tall posture and sequencing with RW, did not attempt much ambulation today as it would have been safer to have a chair follow secondary to patient fatigue Gait Pattern: Trunk flexed;Shuffle;Decreased step length - left;Decreased step length - right General Gait Details: downward gaze with short steps              PT Diagnosis: Difficulty walking;Abnormality of gait;Generalized weakness;Acute pain  PT Problem List: Decreased strength;Decreased activity tolerance;Decreased balance;Decreased mobility;Pain;Cardiopulmonary status limiting activity PT Treatment Interventions: DME instruction;Gait training;Functional mobility training;Therapeutic activities;Therapeutic exercise;Balance training;Neuromuscular re-education;Patient/family education   PT Goals Acute Rehab PT Goals PT Goal Formulation: With patient Time For Goal Achievement: 04/13/12 Potential to Achieve Goals: Good Pt will Roll Supine to Right Side: with modified independence PT Goal: Rolling Supine to Right Side - Progress: Goal set today Pt will Roll Supine to Left Side: with modified independence PT Goal: Rolling Supine to Left Side - Progress: Goal set today Pt will go Supine/Side to Sit: with modified independence PT Goal: Supine/Side to Sit - Progress: Goal set today Pt will go Sit to Supine/Side: with modified independence PT  Goal: Sit to Supine/Side - Progress: Goal set today Pt will go Sit to Stand:  with supervision PT Goal: Sit to Stand - Progress: Goal set today Pt will go Stand to Sit: with supervision PT Goal: Stand to Sit - Progress: Goal set today Pt will Transfer Bed to Chair/Chair to Bed: with supervision PT Transfer Goal: Bed to Chair/Chair to Bed - Progress: Goal set today Pt will Ambulate: 51 - 150 feet;with supervision;with least restrictive assistive device PT Goal: Ambulate - Progress: Goal set today Pt will Perform Home Exercise Program: with supervision, verbal cues required/provided PT Goal: Perform Home Exercise Program - Progress: Goal set today  Visit Information  Last PT Received On: 04/06/12 Assistance Needed: +1    Subjective Data  Subjective: I was trying to step into the tub with my daughter's help and I started to go down and couldn't catch myself.  Patient Stated Goal: to be stronger   Prior Functioning  Home Living Lives With: Daughter Available Help at Discharge: Family;Available 24 hours/day Type of Home: House Home Access: Ramped entrance Home Layout: One level Bathroom Shower/Tub: Engineer, manufacturing systems: Standard Home Adaptive Equipment: Walker - rolling Additional Comments: daughters rotate staying at home wtih her to help  Prior Function Level of Independence: Needs assistance Needs Assistance: Bathing;Dressing;Meal Prep;Light Housekeeping;Gait;Transfers Bath: Minimal Dressing: Minimal Meal Prep: Total Light Housekeeping: Total Gait Assistance: ambulate with RW around her home with supervision Transfer Assistance: min-mingaurdA Able to Take Stairs?: No Driving: No Vocation: Retired Musician: No difficulties    Cognition  Overall Cognitive Status: Appears within functional limits for tasks assessed/performed Arousal/Alertness: Awake/alert Orientation Level: Oriented X4 / Intact Behavior During Session: [facility] for tasks performed    Extremity/Trunk Assessment Right Upper Extremity Assessment RUE  ROM/Strength/Tone: WFL for tasks assessed RUE Sensation: WFL - Light Touch Left Upper Extremity Assessment LUE ROM/Strength/Tone: WFL for tasks assessed LUE Sensation: WFL - Light Touch Right Lower Extremity Assessment RLE ROM/Strength/Tone: Deficits RLE ROM/Strength/Tone Deficits: generally weak, grossly 4+/5 RLE Sensation: History of peripheral neuropathy Left Lower Extremity Assessment LLE ROM/Strength/Tone: Deficits LLE ROM/Strength/Tone Deficits: grossly 4-4+/5 (slightly weaker than right) LLE Sensation: History of peripheral neuropathy   Balance Static Sitting Balance Static Sitting - Balance Support: Bilateral upper extremity supported;No upper extremity supported Static Sitting - Level of Assistance: 5: Stand by assistance Static Sitting - Comment/# of Minutes: pt relying heavily on upper extremities to support her sitting EOB, able to sit with hands in lap for approx 30 seconds before wanting to use upper extremities again Static Standing Balance Static Standing - Balance Support: Bilateral upper extremity supported Static Standing - Level of Assistance: 4: Min assist Static Standing - Comment/# of Minutes: minA for stability initially, cues for tall posture Dynamic Standing Balance Dynamic Standing - Balance Support: Bilateral upper extremity supported Dynamic Standing - Level of Assistance: 4: Min assist;3: Mod assist Dynamic Standing - Comments: pt attempting standing march with need of min-modA for stability as pt tending to lose balance laterally with weight shifting and attempts at SLS regardless of using RW (pt also having difficulty sequencing RW use to stabilize)  End of Session PT - End of Session Equipment Utilized During Treatment: Gait belt Activity Tolerance: Patient tolerated treatment well;Patient limited by fatigue Patient left: in chair;with call bell/phone within reach Nurse Communication: Mobility status  GP     [facility] HELEN 04/06/2012, 12:18  PM

## 2012-04-06 NOTE — Progress Notes (Signed)
Patient ID: [name] [name], female   DOB: 04/04/1934, 78 y.o.   MRN: [id number]  Family Medicine Teaching Service Daily Progress Note Service Page: [phone]  Subjective: No acute events overnight.  Still coughing, but otherwise has no complaints at this time.  She has eaten her entire breakfast without difficulty.  Denies any pain.  Objective: Temp:  [97.7 F (36.5 C)-102.3 F (39.1 C)] 98.7 F (37.1 C) (10/12 0927) Pulse Rate:  [68-109] 79  (10/12 0927) Resp:  [18-27] 20  (10/12 0927) BP: (108-149)/(47-73) 109/50 mmHg (10/12 0927) SpO2:  [89 %-100 %] 97 % (10/12 0007) Weight:  [225 lb (102.059 kg)] 225 lb (102.059 kg) (10/12 0300)  Exam: General: pleasant, sitting up in bed eating breakfast, obese, in no acute distress Cardiovascular: RRR, no murmur appreciated Respiratory: clear to auscultation bilaterally, ? Scattered crackles, but no wheezes Abdomen: mild distension, but non-tender, active BS  Extremities: bruising and healing laceration on LT shin  I have reviewed the patient's medications, labs, imaging, and diagnostic testing.  Notable results are summarized below.  CBC BMET   Lab 04/06/12 0645 04/05/12 2007 04/05/12 1231  WBC 13.0* 15.6* 11.4*  HGB 9.2* 10.0* 11.9*  HCT 30.1* 32.3* 37.3  PLT 133* 144* 186    Lab 04/06/12 0645 04/05/12 2007 04/05/12 1231  NA 138 -- 139  K 4.4 -- 4.7  CL 99 -- 96  CO2 28 -- 31  BUN 36* -- 28*  CREATININE 2.56* 2.22* 1.92*  GLUCOSE 186* -- 314*  CALCIUM 9.3 -- 10.2     Imaging/Diagnostic Tests: CHEST - 2 VIEW  Comparison: Yesterday  Findings: Low lung volumes. Patchy minimal densities at the left  base and right midlung zone laterally have increased. Small  pleural effusions are suspected. Cardiomegaly. No pneumothorax.  IMPRESSION:  New minimal bilateral patchy airspace opacities. Small pleural  effusions are suspected.   Urine culture: Pending  Plan: ZAINEB NOWACZYK is a 78 y.o. female presenting with fever in ED,  cough, congestion and witnessed fall at home. Found to have possible consolidation on CXR and UTI.  # Infectious Disease - Patient is febrile in the ED with a max temp of 102.3 and WBC 11.4. Patient was started on levofloxacin 750mg  qd. Patient has positive sick contacts (son-in-law has pneumonia). Patient's UA was positive nitrites, moderate leukocytes, and 11-20 WBC. A portable CXR showed atelectasis and/or consolidation. The source of this infection is UTI versus pneumonia.  Most recent CXR reveals patchy minimal bilateral patchy airspace opacities. -- Continue levofloxacin to cover both presumed CAP and UTI   -- Urine culture and blood culture (obtained in ED) pending  -- Foley placed for urinary incontinence  # Fall - Patient has a history of falls which are attributed to having difficulty moving her legs. The patient has a history of restless leg syndrome and chronic weakness which are followed by her PCP. There was concern for cervical myelopathy. -- Consider MRI to evaluate for cervical myelopathy per previous PCP office visit.  -- Follow up PT/OT for deconditioning and discharge planning  -- Will continue Requip and Lyrica (home medications) for Restless Leg Syndrome  -- Up with assistance only  # Acute on Chronic Kidney Disease - Patient has CKD stage 3. On admission she has a creatinine of 1.92.  This is trending up to 2.56.  Baseline of 1.3-1.4.  Likely pre-renal. -- MIVF 1/2NS @ 119mL/hr  -- Repeat BMET in AM  -- Will order FeNa  # Diabetes Type 2 -  Patient is on glipizide and metformin at home. Last A1c in clinic was 7.4.  CBG stable. -- Holding home glipizide and metformin  -- Sensitive SSI  # Hyperlipidemia: Patient is on simvastatin 20mg  daily -- Continue home simvastatin 20mg  daily  # Depressive Disorder -- Continue home celexa 40mg  daily  # Restless leg syndrome -- Continue home Requip and lyrica  -- Follow up PT recommendations  # Hypertension -- Continue home metoprolol  succinate 25mg  qd  # Sleep Apnea: will consider adding CPAP if needed FEN/GI: carb modified, 1/2 NS @ 125 cc/hr Prophylaxis: Hep sub Q Disposition: Admit to med-surg floor; to home vs. assisted living pending PT/OT recommendations.  DE LA CRUZ,IVY, DO 04/06/2012, 9:43 AM

## 2012-04-06 NOTE — Progress Notes (Signed)
Informed of positive blood cultures x 2 with gram negative rods.  Patient currently on levofloxacin which has only about 40% coverage for pseudomonas per pharmacy and 75% for E. Coli. We will advance patient to Cefepime 1g q24 adjusted for renal function. Does have allergy to Keflex in past with presumed connection to oral ulcerations but it is unclear per records if true allergy. Zosyn as a PCN would also be potential risk so will continue with cefepime for now.   Additionally, able to calculate FENA at this time and noted at 0.18. Patient currently being hydrated at 125 ml/hr. Will give 500 cc bolus in addition to fluids (last echo 2008 without signs of heart failure) and follow up in AM with BMET

## 2012-04-06 NOTE — Progress Notes (Signed)
Rehab Admissions Coordinator Note:  Patient was screened by Clois Dupes for appropriateness for an Inpatient Acute Rehab Consult. [employer] Medicare will not approve inpt rehab for this diagnosis.  At this time, we are recommending Skilled Nursing Facility.  Clois Dupes 04/06/2012, 6:46 PM  I can be reached at [phone].Marland Kitchen

## 2012-04-06 NOTE — Progress Notes (Signed)
[name] [name] from [employer] called with a critical value: "Blood cultures in two aerobic bottles have gram negative rods."  This result relayed to MD Hunter. No orders relayed over the phone. Will cont to monitor.  Minor, [name] [name]

## 2012-04-07 LAB — BASIC METABOLIC PANEL
GFR calc Af Amer: 25 mL/min — ABNORMAL LOW (ref >90)
GFR calc non Af Amer: 22 mL/min — ABNORMAL LOW (ref >90)
Glucose, Bld: 190 mg/dL — ABNORMAL HIGH (ref 70–99)
Potassium: 5 mEq/L (ref 3.5–5.1)
Sodium: 134 mEq/L — ABNORMAL LOW (ref 135–145)

## 2012-04-07 LAB — URINE CULTURE: Colony Count: >=100000

## 2012-04-07 LAB — AMMONIA: Ammonia: 46 umol/L (ref 11–60)

## 2012-04-07 LAB — GLUCOSE, CAPILLARY
Glucose-Capillary: 201 mg/dL — ABNORMAL HIGH (ref 70–99)
Glucose-Capillary: 313 mg/dL — ABNORMAL HIGH (ref 70–99)

## 2012-04-07 MED ORDER — SODIUM CHLORIDE 0.9 % IV SOLN
INTRAVENOUS | Status: DC
  Administered 2012-04-07 – 2012-04-08 (×2): via INTRAVENOUS
  Administered 2012-04-08: 125 mL/h via INTRAVENOUS
  Administered 2012-04-09: 11:00:00 via INTRAVENOUS

## 2012-04-07 NOTE — Progress Notes (Signed)
CSW spoke with pt's dtr, Enid Derry [phone], re: possible SNF as pt not eligible for CIR. Pt's dtr refusing SNF and reports plan is to take pt home with her.  Pt's dtr able to provide 24 hour supervision/assist and is agreeable to [facility] will need Case Management Consult for [facility] services. CSW signing off as no other CSW needs identified at this time.  Dellie Burns, MSW, LCSWA [phone] (Weekends 8:00am-4:30pm)

## 2012-04-07 NOTE — Progress Notes (Addendum)
Patient ID: [name] [name], female   DOB: January 27, 1934, 78 y.o.   MRN: [id number]  Family Medicine Teaching Service Daily Progress Note Service Page: [phone]  Subjective: No overnight events. She reports feeling better. Her last bowel movement was the morning of admission (10/11). She denies abdominal pain and nausea and is eating without difficulty.   Objective: Temp:  [97.8 F (36.6 C)-98.7 F (37.1 C)] 97.8 F (36.6 C) (10/13 0600) Pulse Rate:  [65-74] 74  (10/13 0600) Resp:  [20] 20  (10/13 0600) BP: (115-146)/(49-86) 144/86 mmHg (10/13 0600) SpO2:  [96 %-98 %] 98 % (10/13 0600)  Exam: General: pleasant, sitting up in bed eating breakfast, obese, in no acute distress Cardiovascular: RRR, no murmur appreciated Respiratory: NI WOB; fair air movement, no crackles, occasional wheeze Abdomen: NABS, soft, NT, obese  Extremities: no edema  I have reviewed the patient's medications, labs, imaging, and diagnostic testing.  Notable results are summarized below.  CBC BMET   Lab 04/06/12 0645 04/05/12 2007 04/05/12 1231  WBC 13.0* 15.6* 11.4*  HGB 9.2* 10.0* 11.9*  HCT 30.1* 32.3* 37.3  PLT 133* 144* 186    Lab 04/07/12 0500 04/06/12 0645 04/05/12 2007 04/05/12 1231  NA 134* 138 -- 139  K 5.0 4.4 -- 4.7  CL 100 99 -- 96  CO2 26 28 -- 31  BUN 35* 36* -- 28*  CREATININE 2.09* 2.56* 2.22* --  GLUCOSE 190* 186* -- 314*  CALCIUM 9.3 9.3 -- 10.2     Imaging/Diagnostic Tests: Renal U/S: no evidence of hydronephrosis to suggest urinary tract obstruction.   Plan: KAINA ORENGO is a 78 y.o. female presenting with fever in ED, cough, congestion and witnessed fall at home. Found to have possible consolidation on CXR and E. coli UTI.   # Infectious Disease - Patient is febrile in the ED with a max temp of 102.3 and WBC 11.4. Patient was started on levofloxacin 750mg  qd. Patient has positive sick contacts (son-in-law has pneumonia). A portable CXR showed atelectasis and/or  consolidation and 10/12 CXR reveals patchy minimal bilateral patchy airspace opacities. She also has E. Coli UTI, sensitivities pending.  Her blood cultures x 2 are positive for GNR that appear to be E. coli after speaking with lab technician.  -- Continue Cefepime until blood culture sensitivities (anticipate tomorrow). She is tolerating the antibiotic; she has allergy for Keflex.  -- Foley placed for urinary incontinence>>>discontinue   # Fall - Patient has a history of falls which are attributed to having difficulty moving her legs. The patient has a history of restless leg syndrome and chronic weakness which are followed by her PCP. There was concern for cervical myelopathy. -- Consider MRI to evaluate for cervical myelopathy per previous PCP office visit -- Will continue Requip and Lyrica (home medications) for Restless Leg Syndrome  -- Up with assistance only  -- PT/OT: recommending IPR>>>insurance will not cover. Will arrange for SNF placement.  # Acute on Chronic Kidney Disease - Patient has CKD stage 3 (baseline 1.3-1.4). On admission she has a creatinine of 1.92.   Likely pre-renal: FENA 0.18 and renal ultrasound normal.  Cr improving with hydration.  -- MIVF 1/2NS @ 195mL/hr>>>Will change to NS since Na dropping.  -- Repeat BMET in AM   # Diabetes Type 2 - Patient is on glipizide and metformin at home. Last A1c in clinic was 7.4.  CBG stable. -- Holding home glipizide and metformin  -- Sensitive SSI   # Hyperlipidemia: Patient is on  simvastatin 20mg  daily -- Continue home simvastatin 20mg  daily   # Depressive Disorder -- Continue home celexa 40mg  daily   # Restless leg syndrome -- Continue home Requip and Lyrica   # Hypertension -- Continue home metoprolol succinate 25mg  qd   # Sleep Apnea: will consider adding CPAP if needed>>>she did not need overnight  FEN/GI: carb modified, MIVF @ 125 cc/hr  Prophylaxis: Hep sub Q  Disposition:  -- Consult CSW for SNF  placement -- Her daughter Okey Regal [phone] helps make her medical decisions. We will call her today and update her.>>>As per LCSW note, patient's daughter would like her to go home with home health and will provide 24-hour assistance. Rx for bed rails, hand rails, and shower seat will be printed and given at time of discharge.    OH PARK, ANGELA, MD 04/07/2012, 11:17 AM

## 2012-04-07 NOTE — Progress Notes (Signed)
I have seen and examined this patient. I have discussed with Dr Maryclare Bean.  I agree with their findings and plans as documented in their progress note.  Acute Issues 1. E. Coli sepsis - Urinary origin - hemodynamically stable. - Improved symptoms today. - Still with Asterixis-like movement in hands and feet. Need follow this up when patient is improved from the possible  Sepsis-related  encephalopathy.  Slightly elevated transaminases on admission.  Will check serum Ammonia and recheck LFTs in morning.

## 2012-04-08 DIAGNOSIS — R7881 Bacteremia: Secondary | ICD-10-CM | POA: Diagnosis present

## 2012-04-08 LAB — CBC
HCT: 29.1 % — ABNORMAL LOW (ref 36.0–46.0)
Hemoglobin: 9.1 g/dL — ABNORMAL LOW (ref 12.0–15.0)
MCHC: 31.3 g/dL (ref 30.0–36.0)
MCV: 93.6 fL (ref 78.0–100.0)
RDW: 16.7 % — ABNORMAL HIGH (ref 11.5–15.5)
WBC: 6.1 10*3/uL (ref 4.0–10.5)

## 2012-04-08 LAB — GLUCOSE, CAPILLARY
Glucose-Capillary: 228 mg/dL — ABNORMAL HIGH (ref 70–99)
Glucose-Capillary: 239 mg/dL — ABNORMAL HIGH (ref 70–99)
Glucose-Capillary: 266 mg/dL — ABNORMAL HIGH (ref 70–99)

## 2012-04-08 LAB — CULTURE, BLOOD (ROUTINE X 2)

## 2012-04-08 LAB — COMPREHENSIVE METABOLIC PANEL
ALT: 31 U/L (ref 0–35)
Albumin: 2.7 g/dL — ABNORMAL LOW (ref 3.5–5.2)
Alkaline Phosphatase: 66 U/L (ref 39–117)
Calcium: 9.5 mg/dL (ref 8.4–10.5)
Potassium: 5.2 mEq/L — ABNORMAL HIGH (ref 3.5–5.1)
Sodium: 136 mEq/L (ref 135–145)
Total Protein: 5.7 g/dL — ABNORMAL LOW (ref 6.0–8.3)

## 2012-04-08 MED ORDER — LEVOFLOXACIN 750 MG PO TABS
750.0000 mg | ORAL_TABLET | ORAL | Status: DC
  Administered 2012-04-08: 750 mg via ORAL
  Filled 2012-04-08: qty 1

## 2012-04-08 MED ORDER — INSULIN GLARGINE 100 UNIT/ML ~~LOC~~ SOLN
10.0000 [IU] | Freq: Every day | SUBCUTANEOUS | Status: DC
  Administered 2012-04-08: 10 [IU] via SUBCUTANEOUS

## 2012-04-08 NOTE — Progress Notes (Signed)
SATURATION QUALIFICATIONS:  Patient Saturations on Room Air at Rest = 84%  Patient Saturations on Room Air while Ambulating = 84%  Patient Saturations on 2 Liters of oxygen while Ambulating = 92-94%  Statement of medical necessity for home oxygen:  This patient desaturates with mobility on RA.  Her O2 sats return to the 90s with 2 L O2 [place].    Rollene Rotunda Deneise Getty, PT, DPT [phone]

## 2012-04-08 NOTE — Clinical Documentation Improvement (Signed)
BMI DOCUMENTATION CLARIFICATION QUERY  THIS DOCUMENT IS NOT A PERMANENT PART OF THE MEDICAL RECORD         04/08/12  Dear Dr. Tye Savoy,  In an effort to better capture your patient's severity of illness, reflect appropriate length of stay and utilization of resources, a review of the patient medical record has revealed the following indicators.   Based on your clinical judgment, please clarify and document in a progress note and/or discharge summary the clinical condition associated with the following supporting information: In responding to this query please exercise your independent judgment.  The fact that a query is asked, does not imply that any particular answer is desired or expected.   According to the documented Height and Weight in CHL/EPIC, the patients BMI is 44. If your clinical findings/judgment agrees with this,if possible could you please help clarify the suspected diagnosis in the progress note and discharge summary. THANK YOU!    BEST PRACTICE: A diagnosis of UNDERWEIGHT or MORBID OBESITY should have the BMI documented along with it.  Possible Clinical Conditions?  - Morbid Obesity  - Other condition (please document in the progress notes and/or discharge summary)  - Cannot Clinically determine at this time   Supporting Information:  Body mass index is 43.94 kg/(m^2). Filed Weights   04/06/12 0300  Weight: 225 lb (102.059 kg)  Height: 5'0"  On 10/12      Reviewed: additional documentation in the medical record   Thank Darden Palmer  Clinical Documentation Specialist: [phone] Pager  Health Information Management [place]

## 2012-04-08 NOTE — Progress Notes (Signed)
Family Medicine Teaching Service                                                                 [facility] Progress Note     Patient name: [name] [name] Medical record number: [id number] Date of birth: 1933-11-02 Age: 78 y.o. Gender: female    LOS: 3 days   Primary Care Provider: Tobin Chad, MD  78 y.o. female presenting with fever in ED, cough, congestion and witnessed fall at home. Found to have possible consolidation on CXR and E. coli UTI.  Overnight Events: No acute events overnight. Patient states that she does have more difficulty feeding herself this morning due to her arms feeling weak. She states that this is a chronic problem but is worse this morning when compared to baseline.   Objective: Vital signs in last 24 hours: BP 142/59  Pulse 68  Temp 99.2 F (37.3 C) (Oral)  Resp 20  Ht 5' (1.524 m)  Wt 225 lb (102.059 kg)  BMI 43.94 kg/m2  SpO2 98%    Physical Exam: Gen: Elderly woman laying in bed in NAD HEENT:  Mucosa moist. EOMI.  CV: RRR. No murmurs. Res: Clear breath sounds. No rales. No wheezes. Abd: Soft. Non tender. Non distended. Ext/Musc: No edema Neuro: Alert. 5/5 strength in upper extremities. Patient not able to complete finger to nose as patient drops her arms when they are outstretched. No sensory deficits.  Labs/Studies: Chem: Na 136  K 5.2  Cl 103  CO2 30  Creatinie 1.78  Calcium 9.5  Glucose 254   CBC: WBC 6.1  Hgb 3.11  Hct 29.1  Platelets 124  Alk phos 66 Albumin 2.7  AST 35  ALT 31  Total Protein 5.7  Total bili 0.2  Urine Culture - E. Coli  AMPICILLIN Resistant  CEFAZOLIN Sensitive  CEFTRIAXONE Sensitive  CIPROFLOXACIN Resistant GENTAMICIN Sensitive  LEVOFLOXACIN Intermediate  NITROFURANTOIN Intermediate  PIP/TAZO Sensitive TOBRAMYCIN Sensitive  TRIMETH/SULFA Sensitive  Blood Culture - E. Coli AMPICILLIN Sensitive AMPICILLIN/SULBACTAM Sensitive  CEFAZOLIN Sensitive    CEFEPIME Sensitive  CEFOXITIN Sensitive CEFTAZIDIME Sensitive CEFTRIAXONE Sensitive  CIPROFLOXACIN Sensitive  GENTAMICIN Sensitive IMIPENEM Sensitive  PIP/TAZO Sensitive  TOBRAMYCIN Sensitive  TRIMETH/SULFA Sensitive   Medications: Scheduled Meds:   . allopurinol  200 mg Oral Daily  . aspirin  325 mg Oral Daily  . calcium-vitamin D  1 tablet Oral TID  . ceFEPime (MAXIPIME) IV  1 g Intravenous Q24H  . citalopram  40 mg Oral Daily  . fluticasone  2 spray Each Nare Daily  . heparin  5,000 Units Subcutaneous Q8H  . insulin aspart  0-5 Units Subcutaneous QHS  . insulin aspart  0-9 Units Subcutaneous TID WC  . metoprolol succinate  25 mg Oral Daily  . omega-3 acid ethyl esters  1 g Oral Daily  . pneumococcal 23 valent vaccine  0.5 mL Intramuscular Tomorrow-1000  . pregabalin  150 mg Oral QHS  . rOPINIRole  4 mg Oral QHS  . senna  1 tablet Oral Daily  . simvastatin  20 mg Oral QHS   Continuous Infusions:   . sodium chloride 125 mL/hr at 04/08/12 0733  . DISCONTD: sodium chloride 125 mL/hr at 04/07/12 0919   PRN Meds:.acetaminophen, acetaminophen, albuterol, benzonatate, ondansetron (ZOFRAN) IV,  ondansetron, traMADol-acetaminophen   Assessment/Plan:  78 y.o. female presenting with fever in ED, cough, congestion and witnessed fall at home. Found to have possible consolidation on CXR and E. coli UTI.  Infectious Disease - Patient was febrile in the ED with a max temp of 102.3 and WBC 11.4. Patient was started on levofloxacin 750mg  qd. Patient had positive sick contacts (son-in-law has pneumonia). A portable CXR showed atelectasis and/or consolidation and 10/12 CXR revealed patchy minimal bilateral patchy airspace opacities. She also has E. Coli UTI, sensitive to CEFAZOLIN, CEFTRIAXONE, GENTAMICIN, PIP/TAZO, TOBRAMYCIN, and TRIMETH/SULFA. Blood cultures x 2 positive for E. Coli as well. she has allergy for Keflex. -- Transition to PO Bactrim 160/800mg  bid for 3 days  Fall -  Patient has a history of falls which are attributed to having difficulty moving her legs. The patient has a history of restless leg syndrome and chronic weakness which are followed by her PCP. There was concern for cervical myelopathy.  -- Could consider MRI as outpatient to evaluate for cervical myelopathy per previous PCP office visit  -- Will continue Requip and Lyrica (home medications) for Restless Leg Syndrome  -- Up with assistance only  -- PT/OT: recommending IPR but insurance will not cover. Daughter refused SNF. Daughter will provide 24/7 care at home.   Acute on Chronic Kidney Disease - Patient has CKD stage 3 (baseline 1.3-1.4). On admission she has a creatinine of 1.92.  Likely pre-renal: FENA 0.18 and renal ultrasound normal. Cr improving with hydration and has trended down to 1.78 -- d/c fluids   Diabetes Type 2 - Patient is on glipizide and metformin at home. Last A1c in clinic was 7.4. CBG stable.  -- Holding home glipizide and metformin  -- Sensitive SSI   Hyperlipidemia: Patient is on simvastatin 20mg  daily  -- Continue home simvastatin 20mg  daily   Depressive Disorder  -- Continue home celexa 40mg  daily   Restless leg syndrome  -- Continue home Requip and Lyrica   Hypertension  -- Continue home metoprolol succinate 25mg  qd   FEN/GI: carb modified Prophylaxis: Hep sub Q  Disposition: home with daughter.   -- Her daughter Okey Regal [phone] helps make her medical decisions. As per LCSW note, patient's daughter would like her to go home with home health and will provide 24-hour assistance. Rx for bed rails, hand rails, and shower seat will be printed and given at time of discharge.      Regino Bellow                PGY-3 ADDENDUM:  Patient seen, examined independently. Available data reviewed. Agree with findings, assessment, and plan as outlined by Regino Bellow.    S: Patient says she feels tired and has had difficulty drinking/eating foods because she is  weak.  Physical Exam: Gen: laying in bed comfortably, in NAD CV: RRR. No murmurs. Res: Clear breath sounds. No rales. No wheezes. Abd: Soft. Non tender. Non distended. Ext/Musc: No edema Neuro: Alert. 5/5 strength in upper extremities.  No sensory deficits.  Hypertonicity of bilateral lower extremities and mild asterixis bilateral upper extremities.  I agree with plan above.  Will discuss home vs. SNF for short term rehab with patient.  We know her daughter would like to take her home, but patient still complains of weakness.  Will perform MMSE today.  Will transition to PO Levaquin today.  Anticipate discharge today or tomorrow.   Ivy [employer], DO 04/08/2012 1:55 PM

## 2012-04-08 NOTE — Progress Notes (Signed)
Inpatient Diabetes Program Recommendations  AACE/ADA: New Consensus Statement on Inpatient Glycemic Control (2013)  Target Ranges:  Prepandial:   less than 140 mg/dL      Peak postprandial:   less than 180 mg/dL (1-2 hours)      Critically ill patients:  140 - 180 mg/dL   Results for KATHRY, HILGEMAN (MRN [id number]) as of 04/08/2012 11:38  Ref. Range 04/07/2012 06:41 04/07/2012 11:31 04/07/2012 16:24 04/07/2012 21:25 04/08/2012 06:32  Glucose-Capillary Latest Range: 70-99 mg/dL 782 (H) 956 (H) 213 (H) 347 (H) 228 (H)    Inpatient Diabetes Program Recommendations Insulin - Basal: Add Lantus 10 units   Thank you  Piedad Climes RN,BSN,CDE Inpatient Diabetes Coordinator [phone] (team pager)

## 2012-04-08 NOTE — Clinical Documentation Improvement (Signed)
POA DOCUMENTATION CLARIFICATION QUERY  THIS DOCUMENT IS NOT A PERMANENT PART OF THE MEDICAL RECORD  Please update your documentation within the medical record to reflect your response to this query.                                                                                    04/08/12  Dear Dr. Tye Savoy,  In a better effort to capture your patient's severity of illness, reflect appropriate length of stay and utilization of resources, a review of the patient medical record has revealed the following indicators.   Based on your clinical judgment, please clarify and document in a progress note and/or discharge summary the clinical condition associated with the following supporting information: In responding to this query please exercise your independent judgment.  The fact that a query is asked, does not imply that any particular answer is desired or expected.   Laqueta Carina presented with a witnessed fall at home and fever in the ED. Per Dr. Perley Jain on 10/13 (after positive blood culture results were received) "E.Coli Sepsis". If possible, please help clarify if this diagnosis continues to be a part of the patients Assessment/Plan AND if the diagnosis was Present On Admission (POA). Thank you!  Medicare rules require specification as to whether an inpatient diagnosis was present at the time of admission.  Please clarify if the following diagnosis,             E.Coli Sepsis, was:    - Present at the time of admission (POA)  - NOT present at the time of inpatient admission and it developed during the inpatient stay  - Unable to clinically determine whether the condition was present on admission.  - Documentation insufficient to determine if condition was present at the time of inpatient admission    You may use possible, probable, or suspect with inpatient documentation. possible, probable, suspected diagnoses MUST be documented at the time of discharge.   Reviewed: additional  documentation in the medical record       Thank You,  Saul Fordyce  Clinical Documentation Specialist: [phone] Pager  Health Information Management [place]

## 2012-04-08 NOTE — Progress Notes (Signed)
MMSE 29/30 (patient missed 1 object on delayed recall)  Spoke with patient and her daughter about dispo. Daughter very insistent that patient should go home with her instead of to a SNF. Daughter states that they have had "two bad experiences" at SNFs. When asked directly, the patient states that she would prefer to go home over SNF.  Both the medical team and nursing are concerned about daughters ability to care for the patient in the patient's current debilitated state. Spoke with nursing and they will ask daughter to take on a larger role in helping to transfer the patient from bed to chair and help care for the patient while in the hospital to assess the daughter's ability to care for the patient at home.  Regino Bellow  MS IV

## 2012-04-08 NOTE — Progress Notes (Signed)
Physical Therapy Treatment Patient Details Name: [name] [name] MRN: [id number] DOB: 1934/02/04 Today's Date: 04/08/2012 Time: 7829-5621 PT Time Calculation (min): 44 min  PT Assessment / Plan / Recommendation Comments on Treatment Session  78 y.o. female admitted to [facility] for PNA presents today with increased mobility than previous session.  She was able to walk into the hallway, but her O2 sats dropped to 84% on room air and we had to return the 2 L O2 [place] to her nose to maintain O2 in the 90s with mobility.  She and her daughter are wanting to return home with HHPT and family can provide 24 hour assist.  The daughter helped mobilize the patient during our session and feels confident she can handle her at home.  I spoke at length with them re: equipment and HH needs at discharge.     Follow Up Recommendations  Home health PT;Supervision/Assistance - 24 hour        Barriers to Discharge  none      Equipment Recommendations  3 in 1 bedside comode;Hospital bed;Other (comment) (HHOT)    Recommendations for Other Services  none  Frequency Min 3X/week   Plan Discharge plan needs to be updated;Frequency remains appropriate    Precautions / Restrictions Precautions Precautions: Fall Precaution Comments: monitor O2 sats will likely need home O2   Pertinent Vitals/Pain No reports of pain, O2 sats with mobility on RA 84%, re-applied 2 L O2 [place] to nose and sats stayed in the low to mid 90s with gait despite 3/4 DOE.      Mobility  Bed Mobility Supine to Sit: 6: Modified independent (Device/Increase time);HOB elevated Sitting - Scoot to Edge of Bed: 6: Modified independent (Device/Increase time) Details for Bed Mobility Assistance: pt was able to get to EOB without assistance, but it took multiple tries and an elevated HOB (she can elevate her HOB at home, but has no bedrails.   Transfers Sit to Stand: 3: Mod assist;With upper extremity assist;With armrests;From chair/3-in-1 Stand to Sit: 4:  Min assist;With upper extremity assist;With armrests;To chair/3-in-1 Details for Transfer Assistance: mod assist to get to standing from low recliner chair, min assist from higher bed.  Daughter assisting and able to feel how much assist the pt will need and agrees that she can handle her.  Daughter reports right before coming to the hospital the pt required much more assist and she was helping her at home.  Ambulation/Gait Ambulation/Gait Assistance: 4: Min assist Ambulation Distance (Feet): 70 Feet (50, 20 with seated rest break ) Assistive device: Rolling walker Ambulation/Gait Assistance Details: cues for upright posture, cues to stay closer to RW, cues for pursed lip breathing, assist for balance and posture. Chair to follow to encourage increased walking distance.   Gait Pattern: Step-through pattern;Shuffle;Trunk flexed Gait velocity: less than 1.8 ft/sec which indicates a risk for recurrent falls.      PT Goals Acute Rehab PT Goals PT Goal: Supine/Side to Sit - Progress: Met PT Goal: Sit to Supine/Side - Progress: Progressing toward goal PT Goal: Sit to Stand - Progress: Progressing toward goal PT Goal: Stand to Sit - Progress: Progressing toward goal PT Goal: Ambulate - Progress: Progressing toward goal  Visit Information  Last PT Received On: 04/08/12 Assistance Needed: +1    Subjective Data  Subjective: Pt and daughter want to go home, but are worried about her ability to move around.   Patient Stated Goal: to be stronger   Cognition  Overall Cognitive Status: Appears within  functional limits for tasks assessed/performed Arousal/Alertness: Awake/alert       End of Session PT - End of Session Equipment Utilized During Treatment: Gait belt;Oxygen Activity Tolerance: Patient limited by fatigue Patient left: in bed;with call bell/phone within reach;with family/visitor present Nurse Communication: Other (comment) (spoke with casemanager re: d/c recs)     Lurena Joiner B.  Lakresha Stifter, PT, DPT [phone]   04/08/2012, 5:20 PM

## 2012-04-09 DIAGNOSIS — E875 Hyperkalemia: Secondary | ICD-10-CM | POA: Diagnosis not present

## 2012-04-09 LAB — BASIC METABOLIC PANEL
GFR calc Af Amer: 39 mL/min — ABNORMAL LOW (ref >90)
GFR calc non Af Amer: 34 mL/min — ABNORMAL LOW (ref >90)
Glucose, Bld: 210 mg/dL — ABNORMAL HIGH (ref 70–99)
Potassium: 5.5 mEq/L — ABNORMAL HIGH (ref 3.5–5.1)
Sodium: 139 mEq/L (ref 135–145)

## 2012-04-09 LAB — GLUCOSE, CAPILLARY: Glucose-Capillary: 217 mg/dL — ABNORMAL HIGH (ref 70–99)

## 2012-04-09 LAB — CBC
Hemoglobin: 8.8 g/dL — ABNORMAL LOW (ref 12.0–15.0)
MCHC: 30.6 g/dL (ref 30.0–36.0)
Platelets: 140 10*3/uL — ABNORMAL LOW (ref 150–400)

## 2012-04-09 MED ORDER — SODIUM POLYSTYRENE SULFONATE 15 GM/60ML PO SUSP
15.0000 g | Freq: Once | ORAL | Status: DC
  Filled 2012-04-09: qty 60

## 2012-04-09 MED ORDER — LEVOFLOXACIN 750 MG PO TABS: 750.0000 mg | ORAL_TABLET | ORAL | Status: DC

## 2012-04-09 NOTE — Progress Notes (Addendum)
   CARE MANAGEMENT NOTE 04/09/2012  Patient:  [name] [name], [name] [name]   Account Number:  [id number]  Date Initiated:  04/09/2012  Documentation initiated by:  [facility]  Subjective/Objective Assessment:     Action/Plan:   HH and DME needs. Harland Dingwall [phone]   Anticipated DC Date:  04/09/2012   Anticipated DC Plan:  HOME W HOME HEALTH SERVICES      DC Planning Services  CM consult      [facility] Choice  HOME HEALTH   Choice offered to / List presented to:  C-4 Adult Children   DME arranged  3-N-1  HOSPITAL BED  OXYGEN  SHOWER STOOL      DME agency  [facility]     HH arranged  HH-2 PT  HH-1 RN  HH-6 SOCIAL WORKER  HH-4 NURSE'S AIDE  HH-3 OT      HH agency  [facility]   Status of service:  Completed, signed off Medicare Important Message given?   (If response is "NO", the following Medicare IM given date fields will be blank) Date Medicare IM given:   Date Additional Medicare IM given:    Discharge Disposition:  HOME W HOME HEALTH SERVICES  Per UR Regulation:    If discussed at Long Length of Stay Meetings, dates discussed:    Comments:  04/09/2012 1645 Faxed copy of insurance info to admissions office and [facility]. Isidoro Donning RN CCM Case Mgmt phone [phone]   04/09/2012 1615 Faxed orders for DME, hospital bed, oxygen, 3n1, and shower chair to [facility]. Spoke to rep and states they will have driver deliver portable to tank pt's room. Provided [facility] with updated address and contact number for pt. And info on [facility]. Contacted [facility] to make them aware that pt is d/c home today with HH. Spoke to [facility] rep. Isidoro Donning RN CCM Case Mgmt phone [phone]  04/09/2012 1400 NCM contacted [facility] and [facility]. Pt does not have hospital bed with their agencies. Pt received RW in [facility]/2011 from [facility]. Pt is missing rails from old bed. Dtr states bed is approximately 33-39 years old. Also requesting 3n1, and shower chair for home. Isidoro Donning  RN CCM Case Mgmt phone [phone]

## 2012-04-09 NOTE — Progress Notes (Signed)
Inpatient Diabetes Program Recommendations  AACE/ADA: New Consensus Statement on Inpatient Glycemic Control (2013)  Target Ranges:  Prepandial:   less than 140 mg/dL      Peak postprandial:   less than 180 mg/dL (1-2 hours)      Critically ill patients:  140 - 180 mg/dL   Reason for Visit: Results for ELDEAN, KLATT (MRN [id number]) as of 04/09/2012 13:45  Ref. Range 04/08/2012 11:50 04/08/2012 16:39 04/08/2012 21:04 04/09/2012 06:29 04/09/2012 11:23  Glucose-Capillary Latest Range: 70-99 mg/dL 409 (H) 811 (H) 914 (H) 231 (H) 228 (H)   Note Lantus added on 04/08/12.  May also consider adding Novolog meal coverage 3 units tid with meals (hold if patient eats less than 50%).  Will follow.

## 2012-04-09 NOTE — Progress Notes (Signed)
Patient left hospital in good spirits.   Patient and family were informed to call [facility] upon arriving at their home to order their at-home oxygen supply.  Patient/family acknowledged.

## 2012-04-09 NOTE — Progress Notes (Signed)
FMTS Attending Note Patient seen and examined by me on 04/08/2012, I discussed care plan with resident team and Regino Bellow MS4, and I agree with assess/plan.  Plan for change to oral antibiotic and further evaluation of patient's capacity to make her own health care decisions.  It is my understanding that patient and daughter wish for patient to be discharged to daughter's house after hospitalization. Paula Compton, MD

## 2012-04-09 NOTE — Discharge Summary (Signed)
Physician Discharge Summary  Patient ID: [name] [name] MRN: [id number] DOB: 1933-10-19 Age: 78 y.o.  Admit date: 04/05/2012 Discharge date: 04/09/2012  PCP: Tobin Chad, MD    Discharge Diagnosis: Principal Problem:  *E coli bacteremia  Active Problems:  DIABETES MELLITUS, TYPE II, CONTROLLED, W/NEURO COMPS  RESTLESS LEGS SYNDROME  GAIT DISTURBANCE  Kidney disease, chronic, stage III (GFR 30-59 ml/min) Acute on Chronic Kidney Failure  Falls  Pneumonia, bacterial  Renal failure, acute on chronic  Pyelonephritis, acute  Acute hyperkalemia    Hospital Course 78 y.o. female presented to the ED with fever, cough, congestion and witnessed fall at home. Found to have possible consolidation on CXR and E. coli UTI as well as bacteremia.   E. Coli bacteremia and acute Pyelonephritis: Patient was febrile in the ED with a max temp of 102.3 and WBC 11.4. Patient was started on levofloxacin IV 750mg  qd. Patient had positive sick contacts (son-in-law has pneumonia). A portable CXR showed atelectasis and/or consolidation and 10/12 CXR revealed patchy minimal bilateral patchy airspace opacities. UA and subsequent culture revealed the patient also had E. Coli UTI, sensitive to CEFAZOLIN, CEFTRIAXONE, GENTAMICIN, PIP/TAZO, TOBRAMYCIN, and TRIMETH/SULFA. Blood cultures x 2 also grew E. Coli which was pansensitive. Patient was transitioned to levofloxacin 750mg  PO on 10/14 every other day (adjusted for renal function). Patient will need 14 days total of antibiotics for the bacteremia.  Pneumonia-patient developed O2 requirement due to the pneumonia. Continued at home. Consider ambulating when follows up.    Gait Disturbance/Falls: Patient has a history of falls which are attributed to having difficulty moving her legs. The patient has a history of restless leg syndrome and chronic weakness which are followed by her PCP her home medications of Requip and Lyrica were continued in the hospital.  PT/OT recommended IPR however her insurance will not cover it. Daughter refused SNF and stated that she would provide care at home. Patient stated that she did not want to go to SNF and instead wanted to go home and have her daughter care for her. During this hospitalization, the patient required significant care. She required 2 nurses during her transfers from the bed to the chair and had difficulty feeding herself. The daughter insisted that she would be able to provide adequate care and supervision on discharge. Home health and PT was also arranged. All physicians involved were concerned about daughters ability to care for mother given her debilitation and these concerns were stated on multiple occasions. Patient considered a very high fall risk and high risk for rehospitalization. Despite expressed concerned, patient and mother continued to want to return home. Patient discharged home with [facility] PT/OT/RN/aide/SW as well as hospital bed, bedside commode and other recommended equipment.   Acute on Chronic Kidney Disease: Patient has CKD stage 3 (baseline 1.3-1.4). On admission she has a creatinine of 1.92 which increased to greater than 2 on subsequent days. This was determined to be likely pre-renal and the FENA was 0.18 and a renal ultrasound normal. The patients home lasix, colcrys, and metformin were held and the patient was hydrated with NS and her creatinine continued to improve throughout her hospitalization and trended down to 1.46 on day of discharge. Colcrys and metformin were not continued at discharge with plan to restart potentially on outpatient basis.   Diabetes Type 2: Last A1c in clinic was 7.4. CBG stable. Patient's home glipizide and metformin were held on admission and the patient was started on Lantus 10units qhs, sensitive SSI, and given a  carb modified diet. The patient's CBGs remained elevated during her hospitalization (184-300). This was likely due to the patient's bacteremia, pneumonia,  and UTI. At discharge, Lantus and SSI were stopped for fear of causing hypoglycemia in the outpatient setting as the patient's infection resolved. The patient was placed back on her home glipizide and her metformin was held due to her acute kidney injury.   Hypertension: Patient was continued on her home metoprolol succinate 25mg  qd which she tolerated well. During this hospitalization the patients blood pressure ranged 136-154/50-76.   Hyperkalemia: During this hospitalization the patient's Lasix was held and her K slowly increased from 4.7 on admission to 5.5 on day of discharge. Patient was given one dose of Kayexalate 15g and her home Lasix was restarted at discharge.    Hyperlipidemia: Patient was continued on home simvastatin 20mg  daily which she tolerated well   Depressive Disorder: Patient was continued on home celexa 40mg  daily which she tolerated well. Pharmacy recommended the following : "Due to risk of QTc prolongation, the FDA recommends decreasing to 20mg /day in patients > 65 yo. Consider decreasing dose"   Restless leg syndrome: Patient was continued on her home Requip and Lyrica which she tolerated well    Problem List Patient Active Problem List  Diagnosis  . DIABETES MELLITUS, TYPE II, CONTROLLED, W/NEURO COMPS  . HYPERLIPIDEMIA  . OBESITY, NOS  . ANEMIA, PERNICIOUS  . DEPRESSIVE DISORDER, NOS  . RESTLESS LEGS SYNDROME  . HYPERTENSION, BENIGN SYSTEMIC  . VENOUS INSUFFICIENCY, CHRONIC  . GASTROESOPHAGEAL REFLUX, NO ESOPHAGITIS  . Rosacea  . OSTEOARTHRITIS, LOWER LEG  . SPINAL STENOSIS, LUMBAR  . OSTEOPOROSIS  . INSOMNIA NOS  . APNEA, SLEEP  . GAIT DISTURBANCE  . URINARY INCONTINENCE  . CHRONIC GOUTY ARTHROPATHY W/O MENTION TOPHUS  . Kidney disease, chronic, stage III (GFR 30-59 ml/min)  . Fall  . Pneumonia, bacterial  . Renal failure, acute on chronic  . Pyelonephritis, acute  . E coli bacteremia     Procedures/Imaging:  Dg Chest 2 View  (04/06/2012) Findings: Low lung volumes.  Patchy minimal densities at the left base and right midlung zone laterally have increased.  Small pleural effusions are suspected.  Cardiomegaly.  No pneumothorax.  IMPRESSION: New minimal bilateral patchy airspace opacities.  Small pleural effusions are suspected.     US Renal (04/06/12)  Right Kidney:  No hydronephrosis.  Well-preserved cortex.  Normal size and parenchymal echotexture without focal abnormalities.  10.9 cm in length.  It  Left Kidney:  No hydronephrosis.  Well-preserved cortex. Poorly visualized secondary to body habitus, but there appears to be normal size and parenchymal echotexture without focal abnormalities.  10.6 cm in length.  Bladder:  Completely decompressed with Foley balloon catheter in place.  IMPRESSION: 1.  No evidence of hydronephrosis to suggest urinary tract obstruction.     Dg Chest Portable 1 View (04/05/12) 1.  Atelectasis and/or consolidation, with potential superimposed small left pleural effusion at the base of the left hemithorax. 2.  Mild cardiomegaly. 3.  Atherosclerosis. 4.  Chronic elevation of the right hemidiaphragm with right lower lobe subsegmental atelectasis.   Labs  CBC  Lab 04/09/12 0610 04/08/12 0545 04/06/12 0645  WBC 6.3 6.1 13.0*  HGB 8.8* 9.1* 9.2*  HCT 28.8* 29.1* 30.1*  PLT 140* 124* 133*   BMET  Lab 04/09/12 0610 04/08/12 0545 04/07/12 0500 04/05/12 1231  NA 139 136 134* --  K 5.5* 5.2* 5.0 --  CL 105 103 100 --  CO2 29  28 26 --  BUN 25* 30* 35* --  CREATININE 1.46* 1.78* 2.09* --  CALCIUM 9.6 9.5 9.3 --  PROT -- 5.7* -- 7.4  BILITOT -- 0.2* -- 0.4  ALKPHOS -- 66 -- 87  ALT -- 31 -- 40*  AST -- 35 -- 55*  GLUCOSE 210* 254* 190* --  Patient condition at time of discharge/disposition: stable  Disposition-home with home health, home O2, and 24/7 supervision and assistance by daughter   Follow up issues: 1. Pneumonia, Bacteremia, Pyelonephritis, e. Coli - Patient on Levofloxacin  until October 25.  2. Hyperkalemia - Patient needs BMET checked to evaluate hyperkalemia  3. Diabetes type 2 - Patient's home metformin was held on discharge due to acute on chronic kidney disease, consider restarting along with colcrys as needed.  4. Gait Disturbance - Patient discharged home with daughter as care taker. Patient required a significant amount of assistance while hospitalized. Have high level of concern with patient's poor balance and ability of family to adequately care for patient at home. This was discussed on multiple occassions during this hospitalization. However, patient and daughter both refused SNF placement for patient and desired to care for patient at home.   Discharge follow up:  Follow-up Information    Follow up with Tana Conch, MD. On 04/15/2012. (@ 1:30pm )    Contact information:   [address] [place] [address] [phone]         Discharge Orders    Future Appointments: Provider: Department: Dept Phone: Center:   04/15/2012 1:45 PM Shelva Majestic, MD [facility] [phone] [facility]      Discharge Medications  Chondra, Boyde  Home Medication Instructions ONG:[id number]   Printed on:04/09/12 1454  Medication Information                    aspirin (BAYER ASPIRIN) 325 MG tablet Take 325 mg by mouth daily.            calcium-vitamin D (CALCIUM 500/D) 500-200 MG-UNIT per tablet Take 1 tablet by mouth 3 (three) times daily.            Omega-3 Fatty Acids (FISH OIL) 1000 MG CAPS Ran out (no other instructions)            fluticasone (FLONASE) 50 MCG/ACT nasal spray 2 sprays by Nasal route daily.            alendronate (FOSAMAX) 70 MG tablet Take 70 mg by mouth every 7 (seven) days.  Take in the morning with a full glass of water, on an empty stomach, and do not take anything else by mouth or lie down for the next 30 min.           senna (SENOKOT) 8.6 MG tablet Take 1-2 tablets by mouth daily. for constipation            glipiZIDE (GLUCOTROL) 5 MG tablet TAKE ONE (1) TABLET(S) DAILY           allopurinol (ZYLOPRIM) 100 MG tablet Take 2 tablets (200 mg total) by mouth daily.           furosemide (LASIX) 40 MG tablet Take 1 tablet (40 mg total) by mouth daily.           metoprolol succinate (TOPROL-XL) 25 MG 24 hr tablet TAKE ONE (1) TABLET(S) BY MOUTH DAILY           albuterol (PROVENTIL HFA;VENTOLIN HFA) 108 (90 BASE) MCG/ACT inhaler Inhale 2  puffs into the lungs every 6 (six) hours as needed for wheezing.           pregabalin (LYRICA) 150 MG capsule Take 1 capsule (150 mg total) by mouth daily.           simvastatin (ZOCOR) 20 MG tablet Take 1 tablet (20 mg total) by mouth at bedtime.           rOPINIRole (REQUIP) 4 MG tablet TAKE ONE TABLET BY MOUTH AT BEDTIME           VESICARE 5 MG tablet TAKE ONE TABLET BY MOUTH ONE TIME DAILY           traMADol-acetaminophen (ULTRACET) 37.5-325 MG per tablet Take 1 tablet by mouth every 6 (six) hours as needed. For pain           citalopram (CELEXA) 40 MG tablet Take 40 mg by mouth daily.           levofloxacin (LEVAQUIN) 750 MG tablet Take 1 tablet (750 mg total) by mouth every other day.            Regino Bellow, MS IV 04/09/2012 9:44 AM   Family Medicine Upper Level Addendum:   I have seen and examined the patient independently, discussed with Regino Bellow, MSIV, fully reviewed the discharge summary and agree with it's contents with the additions as noted in blue text. My independent exam is below.   BP 152/76  Pulse 75  Temp 98 F (36.7 C) (Oral)  Resp 18  Ht 5' (1.524 m)  Wt 225 lb (102.059 kg)  BMI 43.94 kg/m2  SpO2 96% Gen: NAD, resting comfortably in bed, Jerome in place HEENT:  MMM, PERRLA  CV: RRR no mrg  Lungs: CTAB  Abd: soft/nontender/nondistended/normal bowel sounds  MSK: moves all extremities, trace edema  Skin: warm and dry, no rash  Neuro: grossly intact, alert and oriented x62   78 year old female here with UTI, E.  Coli bacteremia, and probable pneumonia. Steadily improving with antibiotic therapy. Poor candidate to go home due to generalized weakness and need for rehab but family refusing SNF. Patient to go home with 2 weeks total antibiotics (levaquin). Also for AKI, held metformin and colcyrs at discharge. Restarted lasix due to trending up potassium.    Tana Conch, MD, PGY2 04/09/2012 2:45 PM

## 2012-04-09 NOTE — Clinical Documentation Improvement (Signed)
POA DOCUMENTATION CLARIFICATION QUERY  THIS DOCUMENT IS NOT A PERMANENT PART OF THE MEDICAL RECORD  Dear Dr.Richards,   In a better effort to capture your patient's severity of illness, reflect appropriate length of stay and utilization of resources, a review of the patient medical record has revealed the following indicators.  Based on your clinical judgment, please clarify and document in a progress note and/or discharge summary the clinical condition associated with the following supporting information:  In responding to this query please exercise your independent judgment. The fact that a query is asked, does not imply that any particular answer is desired or expected.    Laqueta Carina presented with a witnessed fall at home and fever in the ED. Per Dr. Perley Jain on 10/13 (after positive blood culture results were received) "E.Coli Sepsis". If possible, please help clarify if this diagnosis continues to be a part of the patients Assessment/Plan AND if the diagnosis was Present On Admission (POA). Thank you!   Medicare rules require specification as to whether an inpatient diagnosis was present at the time of admission. Please clarify if the following diagnosis,   E.Coli Sepsis, was:   - Present at the time of admission (POA)   - NOT present at the time of inpatient admission and it developed during the inpatient stay   - Unable to clinically determine whether the condition was present on admission.   - Documentation insufficient to determine if condition was present at the time of inpatient admission   You may use possible, probable, or suspect with inpatient documentation. possible, probable, suspected diagnoses MUST be documented at the time of discharge.     No additional documentation in chart upon review. SM     Thank You,  Saul Fordyce  Clinical Documentation Specialist: [phone] Pager  Health Information Management [place]

## 2012-04-09 NOTE — Progress Notes (Signed)
Occupational Therapy Treatment Patient Details Name: [name] [name] MRN: [id number] DOB: 1934/04/18 Today's Date: 04/09/2012 Time: 6295-2841 OT Time Calculation (min): 24 min  OT Assessment / Plan / Recommendation Comments on Treatment Session pt fatigues very easily.  Pt and daugther want pt to go home with [facility] therapy. (and not to SNF)  OT explained pt would need significant amount of assist at home with ADL activity    Follow Up Recommendations  Home health OT       Equipment Recommendations  3 in 1 bedside comode;Hospital bed;Other (comment) (HHOT)          Plan Discharge plan needs to be updated    Precautions / Restrictions Precautions Precautions: Fall Precaution Comments: monitor O2 sats will likely need home O2       ADL  Toilet Transfer: Moderate assistance Toilet Transfer Method: Sit to stand Toileting - Clothing Manipulation and Hygiene: Simulated;Maximal assistance Where Assessed - Toileting Clothing Manipulation and Hygiene: Standing;Other (comment) (pt able to stand with hands on walker.  ) Transfers/Ambulation Related to ADLs: OT focused on sit to stand and stand to sit transition in preparation for increased Iwith ADL activity. Pt overall mod A with multiple sit to stand transitions. After standing for 30 seconds pt became shaky and needed to sit ADL Comments: Daugther present for OT session and understands amount of assist pt will need at home.  Pt will need significant assist with toileting as pt needs to have both hands on walker.  Daughter verbalized understanding      OT Goals ADL Goals ADL Goal: Statistician - Progress: Progressing toward goals ADL Goal: Toileting - Clothing Manipulation - Progress: Progressing toward goals ADL Goal: Toileting - Hygiene - Progress: Progressing toward goals  Visit Information  Last OT Received On: 04/09/12          Cognition  Overall Cognitive Status: Appears within functional limits for tasks  assessed/performed Arousal/Alertness: Awake/alert    Mobility  Shoulder Instructions Bed Mobility Details for Bed Mobility Assistance: pt was able to get to EOB without assistance, but it took multiple tries and an elevated HOB (she can elevate her HOB at home, but has no bedrails.   Transfers Sit to Stand: 3: Mod assist;With upper extremity assist;With armrests;From chair/3-in-1 Stand to Sit: 4: Min assist;With upper extremity assist;With armrests;To chair/3-in-1 Details for Transfer Assistance: mod assist to get to standing from low recliner chair, min assist from higher bed.  Daughter assisting and able to feel how much assist the pt will need and agrees that she can handle her.  Daughter reports right before coming to the hospital the pt required much more assist and she was helping her at home.              End of Session OT - End of Session Activity Tolerance: Patient limited by fatigue Patient left: in bed;with call bell/phone within reach;with family/visitor present  GO     Jasmine Mcbeth, Metro Kung 04/09/2012, 2:05 PM

## 2012-04-09 NOTE — Discharge Summary (Signed)
FMTS Attending Note Patient seen and examined by me today, I have discussed her care with the resident team and I agree with assessment and plan.  The patient and her daughter have been made aware of the concern on the part of the inpatient team that she will be more debilitated and thus more difficult to care for at home following discharge.  They have been given the recommendation for SNF placement, and they both have declined the SNF offer.  Thus, we will attempt to arrange as many home health resources as possible for her care at daughter's home.  Patient to follow up with her primary physician, Dr. Sheffield Slider, for hospital follow up. Paula Compton, MD

## 2012-04-09 NOTE — Clinical Documentation Improvement (Signed)
BMI DOCUMENTATION CLARIFICATION QUERY  THIS DOCUMENT IS NOT A PERMANENT PART OF THE MEDICAL RECORD  04/09/12   Dear Dr.Richards,   In an effort to better capture your patient's severity of illness, reflect appropriate length of stay and utilization of resources, a review of the patient medical record has revealed the following indicators.  Based on your clinical judgment, please clarify and document in a progress note and/or discharge summary the clinical condition associated with the following supporting information:  In responding to this query please exercise your independent judgment. The fact that a query is asked, does not imply that any particular answer is desired or expected.     According to the documented Height and Weight in CHL/EPIC, the patients BMI is 44. If your clinical findings/judgment agrees with this,if possible could you please help clarify the suspected diagnosis in the progress note and discharge summary. THANK YOU!  [facility]ST PRACTICE: A diagnosis of UNDERWEIGHT or MORBID OBESITY should have the BMI documented along with it.   Possible Clinical Conditions?  - Morbid Obesity  - Other condition (please document in the progress notes and/or discharge summary)  - Cannot Clinically determine at this time   Supporting Information:   Body mass index is 43.94 kg/(m^2).  Filed Weights    04/06/12 0300   Weight:  225 lb (102.059 kg)   Height: 5'0" On 10/12       No additional documentation in chart upon review. SM    Thank You,  Saul Fordyce  Clinical Documentation Specialist: [phone] Pager  Health Information Management [place]

## 2012-04-09 NOTE — Progress Notes (Addendum)
Family Medicine Teaching Service                                                                 [facility] Progress Note     Patient name: [name] [name] Medical record number: [id number] Date of birth: [birth year] Age: 78 y.o. Gender: female    LOS: 4 days   Primary Care Provider: Tobin Chad, MD  78 y.o. female presenting with fever in ED, cough, congestion and witnessed fall at home. Found to have possible consolidation on CXR and E. coli UTI.  Overnight Events: No acute events overnight. Patient was seen by PT and had desat to 84% on RA while ambulating. Patient was able to feed herself breakfast this morning which is much improved from yesterday morning. Patient adamant about going home instead of SNF  Objective: Vital signs in last 24 hours: BP 152/76  Pulse 75  Temp 98 F (36.7 C) (Oral)  Resp 18  Ht 5' (1.524 m)  Wt 225 lb (102.059 kg)  BMI 43.94 kg/m2  SpO2 96%    Physical Exam: Gen: NAD HEENT:  Mucosa moist. PERRL. EOMI. Right conjunctiva erythematous. Yellow discharge present around eye with a small abrasion noted on the lateral aspect of the lower right eyelid.  CV: RRR. No murmurs. Res: Clear breath sounds. No rales. No wheezes. Abd: Soft. Non tender. Non distended. Bowel sounds present. Ext/Musc: No edema Neuro: Alert. No focal motor or sensory deficits.  Labs/Studies: Chem: Na 139  K 5.5 (elevated from 5.2) Cl 105  CO2 29  BUN 25  Creatinine 1.46 (improvign)  Calcium 9.6  Glucose 210 CBC: WBC 6.3  Hgb 8.8  Hct 28.8  Platelets 140  Medications: Scheduled Meds:   . allopurinol  200 mg Oral Daily  . aspirin  325 mg Oral Daily  . calcium-vitamin D  1 tablet Oral TID  . citalopram  40 mg Oral Daily  . fluticasone  2 spray Each Nare Daily  . heparin  5,000 Units Subcutaneous Q8H  . insulin aspart  0-5 Units Subcutaneous QHS  . insulin aspart  0-9 Units Subcutaneous TID WC  . insulin glargine  10  Units Subcutaneous QHS  . levofloxacin  750 mg Oral Q48H  . metoprolol succinate  25 mg Oral Daily  . omega-3 acid ethyl esters  1 g Oral Daily  . pregabalin  150 mg Oral QHS  . rOPINIRole  4 mg Oral QHS  . senna  1 tablet Oral Daily  . simvastatin  20 mg Oral QHS  . DISCONTD: ceFEPime (MAXIPIME) IV  1 g Intravenous Q24H   Continuous Infusions:   . sodium chloride 125 mL/hr (04/08/12 1759)   PRN Meds:.acetaminophen, acetaminophen, albuterol, benzonatate, ondansetron (ZOFRAN) IV, ondansetron, traMADol-acetaminophen     Assessment/Plan:   78 y.o. female presenting with fever in ED, cough, congestion and witnessed fall at home. Found to have possible consolidation on CXR and E. coli UTI.   E. Coli bacteremia and UTI - Patient was febrile in the ED with a max temp of 102.3 and WBC 11.4. Patient was started on levofloxacin 750mg  qd. Patient had positive sick contacts (son-in-law has pneumonia). A portable CXR showed atelectasis and/or consolidation and 10/12 CXR revealed patchy minimal bilateral patchy airspace opacities. She also has  E. Coli UTI, sensitive to CEFAZOLIN, CEFTRIAXONE, GENTAMICIN, PIP/TAZO, TOBRAMYCIN, and TRIMETH/SULFA. Blood cultures x 2 positive for E. Coli as well. she has allergy for Keflex. Patient was transitioned to PO levofloxacin 750mg  PO every other day (adjusted for renal function) -- Continue levofloxacin for 14 days total of treatment.   Fall - Patient has a history of falls which are attributed to having difficulty moving her legs. The patient has a history of restless leg syndrome and chronic weakness which are followed by her PCP. There was concern for cervical myelopathy.  -- Could consider MRI as outpatient to evaluate for cervical myelopathy per previous PCP office visit  -- Will continue Requip and Lyrica (home medications) for Restless Leg Syndrome  -- Up with assistance only  -- PT/OT: recommending IPR but insurance will not cover. Daughter refused SNF.  Daughter will provide 24/7 care at home.   Acute on Chronic Kidney Disease - Patient has CKD stage 3 (baseline 1.3-1.4). On admission she has a creatinine of 1.92. Likely pre-renal: FENA 0.18 and renal ultrasound normal. Lasix, metformin and colcrys were held. Lasix restarted at discharge but colycrs, metformin were not restarted.  Cr improving with hydration and has trended down to 1.46 at discharge  Diabetes Type 2 - Patient is on glipizide and metformin at home. Last A1c in clinic was 7.4. CBG stable.  -- Holding home glipizide and metformin  -- Lantus 10 units qhs -- Sensitive SSI   Hyperkalemia: Patient had an elevated K of 5.5 this morning -- Kayexalate 15g once --will restart lasix at discharge  Hyperlipidemia: Patient is on simvastatin 20mg  daily  -- Continue home simvastatin 20mg  daily   Depressive Disorder  -- Continue home celexa 40mg  daily  -- could consider decreasing to 20 due to risk of QTc prolongation on outpatient basis given age per pharmacy recommendations.   Restless leg syndrome  -- Continue home Requip and Lyrica   Hypertension  -- Continue home metoprolol succinate 25mg  qd   FEN/GI: carb modified   Prophylaxis: Hep sub Q   Disposition: home with daughter. Will need home O2. -- Her daughter Okey Regal [phone] helps make her medical decisions. As per LCSW note, patient's daughter would like her to go home with home health and will provide 24-hour assistance. Rx for bed rails, hand rails, and shower seat will be printed and given at time of discharge.   Regino Bellow  MS IV         Family Medicine Upper Level Addendum:   I have seen and examined the patient independently, discussed with Regino Bellow, MSIV, fully reviewed the progress note and agree with it's contents with the additions as noted in blue text. My independent exam is below.   BP 152/76  Pulse 75  Temp 98 F (36.7 C) (Oral)  Resp 18  Ht 5' (1.524 m)  Wt 225 lb (102.059 kg)  BMI 43.94 kg/m2   SpO2 96% Gen: NAD, resting comfortably in bed, [place] in place HEENT:  MMM, PERRLA  CV: RRR no mrg  Lungs: CTAB  Abd: soft/nontender/nondistended/normal bowel sounds  MSK: moves all extremities, no edema  Skin: warm and dry, no rash  Neuro: grossly intact, alert and oriented x67   78 year old female here with UTI, E. Coli bacteremia, and probable pneumonia. Steadily improving with antibiotic therapy. Poor candidate to go home due to generalized weakness and need for rehab but family refusing SNF. Will discharge home today with continued antibiotics (did well with oral transition)  Tana Conch, MD, PGY2 04/09/2012 2:45 PM

## 2012-04-12 ENCOUNTER — Telehealth: Payer: Self-pay | Admitting: Family Medicine

## 2012-04-12 ENCOUNTER — Other Ambulatory Visit: Payer: Self-pay | Admitting: Family Medicine

## 2012-04-12 DIAGNOSIS — E785 Hyperlipidemia, unspecified: Secondary | ICD-10-CM

## 2012-04-12 DIAGNOSIS — M48061 Spinal stenosis, lumbar region without neurogenic claudication: Secondary | ICD-10-CM

## 2012-04-12 DIAGNOSIS — I10 Essential (primary) hypertension: Secondary | ICD-10-CM

## 2012-04-12 DIAGNOSIS — E1149 Type 2 diabetes mellitus with other diabetic neurological complication: Secondary | ICD-10-CM

## 2012-04-12 MED ORDER — CITALOPRAM HYDROBROMIDE 20 MG PO TABS: 20.0000 mg | ORAL_TABLET | Freq: Every day | ORAL | Status: DC

## 2012-04-12 MED ORDER — ALLOPURINOL 100 MG PO TABS: 200.0000 mg | ORAL_TABLET | Freq: Every day | ORAL | Status: DC

## 2012-04-12 MED ORDER — ROPINIROLE HCL 4 MG PO TABS: 4.0000 mg | ORAL_TABLET | Freq: Every day | ORAL | Status: DC

## 2012-04-12 MED ORDER — GLIPIZIDE 5 MG PO TABS: 5.0000 mg | ORAL_TABLET | Freq: Every day | ORAL | Status: DC

## 2012-04-12 MED ORDER — FUROSEMIDE 40 MG PO TABS: 40.0000 mg | ORAL_TABLET | Freq: Every day | ORAL | Status: DC

## 2012-04-12 MED ORDER — SIMVASTATIN 20 MG PO TABS: 20.0000 mg | ORAL_TABLET | Freq: Every day | ORAL | Status: DC

## 2012-04-12 MED ORDER — PREGABALIN 150 MG PO CAPS
150.0000 mg | ORAL_CAPSULE | Freq: Every day | ORAL | Status: DC
Start: 2012-04-12 — End: 2012-04-26

## 2012-04-12 MED ORDER — TRAMADOL-ACETAMINOPHEN 37.5-325 MG PO TABS: 2.0000 | ORAL_TABLET | Freq: Three times a day (TID) | ORAL | Status: DC | PRN

## 2012-04-12 MED ORDER — SOLIFENACIN SUCCINATE 5 MG PO TABS: 5.0000 mg | ORAL_TABLET | Freq: Every day | ORAL | Status: DC

## 2012-04-12 NOTE — Telephone Encounter (Signed)
I received faxed requests for 90 day supplies of her medications. Icalled her daughter, Antionette Poles, regarding my decreasing her Citalopram to 20 mg daily due to concern about use of the higher dose in the elderly. She reports her mother's blood pressure is 143/86 and fasting cbg this AM 190. Since we are holding Metformin until Creatinine below 1.4 I asked her to have her mother take Glipizide 5 mg bid until her office visit in 3 days. The refills were sent in electronically, except the Lyrica was faxed.

## 2012-04-12 NOTE — Telephone Encounter (Signed)
Order confirmation fax received on [name] [name] from [employer]. Set up for home O2 with follow up planned on 04/11/12

## 2012-04-15 ENCOUNTER — Encounter: Payer: Self-pay | Admitting: Family Medicine

## 2012-04-15 ENCOUNTER — Ambulatory Visit (INDEPENDENT_AMBULATORY_CARE_PROVIDER_SITE_OTHER): Payer: Medicare HMO | Admitting: Family Medicine

## 2012-04-15 VITALS — BP 135/76 | HR 68 | Ht 60.0 in | Wt 228.0 lb

## 2012-04-15 DIAGNOSIS — M1A00X0 Idiopathic chronic gout, unspecified site, without tophus (tophi): Secondary | ICD-10-CM

## 2012-04-15 DIAGNOSIS — A498 Other bacterial infections of unspecified site: Secondary | ICD-10-CM

## 2012-04-15 DIAGNOSIS — E1149 Type 2 diabetes mellitus with other diabetic neurological complication: Secondary | ICD-10-CM

## 2012-04-15 DIAGNOSIS — J159 Unspecified bacterial pneumonia: Secondary | ICD-10-CM

## 2012-04-15 DIAGNOSIS — N10 Acute tubulo-interstitial nephritis: Secondary | ICD-10-CM

## 2012-04-15 DIAGNOSIS — N183 Chronic kidney disease, stage 3 unspecified: Secondary | ICD-10-CM

## 2012-04-15 DIAGNOSIS — M1A9XX0 Chronic gout, unspecified, without tophus (tophi): Secondary | ICD-10-CM

## 2012-04-15 DIAGNOSIS — R7881 Bacteremia: Secondary | ICD-10-CM

## 2012-04-15 LAB — BASIC METABOLIC PANEL
Calcium: 9.3 mg/dL (ref 8.4–10.5)
Creat: 1.44 mg/dL — ABNORMAL HIGH (ref 0.50–1.10)
Glucose, Bld: 157 mg/dL — ABNORMAL HIGH (ref 70–99)
Sodium: 135 mEq/L (ref 135–145)

## 2012-04-15 NOTE — Assessment & Plan Note (Signed)
Poorly controlled currently while historically moderate well controlled. Check BMET today. If creatinine returned to baseline, will restart metformin.

## 2012-04-15 NOTE — Assessment & Plan Note (Signed)
Improving on levaquin. Tolerating not being on oxygen at rest. Encouraged oxygen for transfers and at night time but could be off as well as felt well and resting in daytime. Patient did not bring walker today but plan is to ambulate off oxygen at next visit to see if she needs home oxygen anymore.

## 2012-04-15 NOTE — Patient Instructions (Addendum)
Great to see you today. I am glad you are feeling so much better after getting out of the hospital. We are going to get a blood test today and I will call you if it is ok to restart your Metformin. Please continue to not use the Colcrys for now. Please follow up with Dr. Sheffield Slider within the next 2 weeks.

## 2012-04-15 NOTE — Progress Notes (Signed)
Subjective:  Hospital follow up for bacteremia, pneumonia, and UTI  1. Pneumonia/bacteremia/weakness-patient compliant with levaquin without side effects. Taking every other day as prescribed. Patient states her breathing has improved. Came in without oxygen today and sats were 94% while resting. She still gets very short of breath when up and moving around. She feels weak when moving from commode, recliner, or bed to wheelchair. She uses oxygen with transports most of the time. Reports she has not gotten her prescription for shower bars or commode bars and requests those today. Strength improving everyday and daugther does not feel overwhelmed currently with her needs or the help she needs with transport. Denies nausea/vomiting/fever/chills/overall sick feelings. No falls at home. No orthopnea, PND, chest pain.   2. UTI/pyelonephritis-still finishing treatment-has some polyuria but this has been a long term issue and she wwets bed frequently due to incontinence. States has been working with Dr. Sheffield Slider. Currently on vesicare. Is to complete full treatment and follow up with Dr. Sheffield Slider.   3. DM-CBGS have been elevated in AM typically greater than 200 since being sick. Still not taking metformin.   4. Gout-still holding colcrys due to CKD and no acute flare. No reports of a flare. Still on allopurinol.   ROS--See HPI  Past Medical History-hypertension, restless legs, seasonal allergies.  Reviewed problem list.  Medications- reviewed and updated Chief complaint-noted  Objective: BP 135/76  Pulse 68  Ht 5' (1.524 m)  Wt 228 lb (103.42 kg)  BMI 44.53 kg/m2  SpO2 94% on room air Gen: NAD CV: RRR no mrg, no JVD Lungs: clear to auscultation, no wheezes, rales, or rhonchi Ext: 1+ edema bilaterally   Assessment/Plan: See problem oriented charted  For swelling, encouraged elevation of legs and TED hose

## 2012-04-15 NOTE — Assessment & Plan Note (Signed)
Improving. Continue full course of antibiotic of 14 days.

## 2012-04-15 NOTE — Assessment & Plan Note (Signed)
Continue to hold Colcrys while Cr elevated. Will defer starting this until follows up with Dr. Sheffield Slider as will start metformin first.

## 2012-04-15 NOTE — Assessment & Plan Note (Signed)
Having some polyuria but on lasix and hasnt finished antibiotic course. Patient to follow up with Dr. Sheffield Slider once finished treatment.

## 2012-04-18 ENCOUNTER — Telehealth: Payer: Self-pay | Admitting: Family Medicine

## 2012-04-18 NOTE — Telephone Encounter (Signed)
Needs new orders for pt to have pull ups - pee pads from [facility] - they told her that she would have to call us for [place] to send orders

## 2012-04-18 NOTE — Telephone Encounter (Signed)
Nurse is calling to let MD know that the patient has not heard back on the blood work that was done the other day.  Her Blood Sugars have been running over 250.  BP is good, pulse ox was 90 so she put oxygen on and it went up to 98, patient has been sleeping a lot.  She suggests that someone call the patients daughter Otto Herb - [phone].

## 2012-04-18 NOTE — Telephone Encounter (Signed)
I called and spoke with Mr [name] [name] to let him know that the blood work done recently was improved

## 2012-04-18 NOTE — Telephone Encounter (Signed)
Will fwd to Dr.Hale for review. Lorenda Hatchet, [name]

## 2012-04-18 NOTE — Telephone Encounter (Signed)
Will fwd. To Dr.Hale for order .[name] [name]  

## 2012-04-18 NOTE — Telephone Encounter (Signed)
I hereby order pull ups for her.   Isn't this better done via fax from the agency requesting the order?

## 2012-04-19 NOTE — Telephone Encounter (Signed)
Left message to call back. Please tell pt to have agency fax a form for the order of 'pull ups' .Arlyss Repress

## 2012-04-21 ENCOUNTER — Telehealth: Payer: Self-pay | Admitting: Family Medicine

## 2012-04-21 NOTE — Telephone Encounter (Signed)
Called and spoke with patient who states she was not instructed to restart Metformin. I just wanted to confirm this as I wanted discussion to be had with Dr. Sheffield Slider at next visit given Creatinine >1.4 and this appears to be close to her baseline although it may be slightly lower. Will forward to Dr. Sheffield Slider who is to see patietn on 04/30/12.

## 2012-04-23 ENCOUNTER — Ambulatory Visit (INDEPENDENT_AMBULATORY_CARE_PROVIDER_SITE_OTHER): Payer: Medicare HMO | Admitting: Family Medicine

## 2012-04-23 ENCOUNTER — Encounter: Payer: Self-pay | Admitting: Family Medicine

## 2012-04-23 VITALS — BP 143/65 | HR 65 | Temp 98.2°F | Ht 60.0 in | Wt 223.0 lb

## 2012-04-23 DIAGNOSIS — E559 Vitamin D deficiency, unspecified: Secondary | ICD-10-CM | POA: Insufficient documentation

## 2012-04-23 DIAGNOSIS — Z23 Encounter for immunization: Secondary | ICD-10-CM

## 2012-04-23 DIAGNOSIS — S81812A Laceration without foreign body, left lower leg, initial encounter: Secondary | ICD-10-CM | POA: Insufficient documentation

## 2012-04-23 DIAGNOSIS — N179 Acute kidney failure, unspecified: Secondary | ICD-10-CM

## 2012-04-23 DIAGNOSIS — N189 Chronic kidney disease, unspecified: Secondary | ICD-10-CM

## 2012-04-23 DIAGNOSIS — S81809A Unspecified open wound, unspecified lower leg, initial encounter: Secondary | ICD-10-CM

## 2012-04-23 DIAGNOSIS — N10 Acute tubulo-interstitial nephritis: Secondary | ICD-10-CM

## 2012-04-23 DIAGNOSIS — E1149 Type 2 diabetes mellitus with other diabetic neurological complication: Secondary | ICD-10-CM

## 2012-04-23 DIAGNOSIS — Z79899 Other long term (current) drug therapy: Secondary | ICD-10-CM

## 2012-04-23 DIAGNOSIS — D510 Vitamin B12 deficiency anemia due to intrinsic factor deficiency: Secondary | ICD-10-CM

## 2012-04-23 MED ORDER — GLIPIZIDE 5 MG PO TABS: ORAL_TABLET | ORAL | Status: DC

## 2012-04-23 MED ORDER — GLIPIZIDE 5 MG PO TABS: 5.0000 mg | ORAL_TABLET | Freq: Two times a day (BID) | ORAL | Status: DC

## 2012-04-23 MED ORDER — CYANOCOBALAMIN 1000 MCG/ML IJ SOLN
1000.0000 ug | Freq: Once | INTRAMUSCULAR | Status: AC
  Administered 2012-04-23: 1000 ug via INTRAMUSCULAR

## 2012-04-23 NOTE — Patient Instructions (Signed)
I will call with the lab test results. Please continue checking sugars in the morning and also check before meals.   Please return to see Dr Sheffield Slider in 1 month

## 2012-04-23 NOTE — Progress Notes (Signed)
  Subjective:    Patient ID: [name] [name], female    DOB: [birth year], 78 y.o.   MRN: [id number]  HPIIn with her daughter [name] [name]  She sleeps 11 or more hours nightly is still lethargic in the daytime. Hard to awaken at times. Takes her Lyrica only at bedtime. Only taking the tramadol couple tablets daily.   Right heel was red and sore but has improved. No injury other than pressure.  Tremor- irregular jerking in hands at times holding a posture  Appetite remains very good  Wheezing intermittently used the inhaler last night. Recent hospitalization for bronchitis pneumonia. Finish Levaquin 2 days ago  Laceration - left shin 2 weeks ago. No signs of infection. For tetanus shot  Review of Systems     Objective:   Physical Exam  Cardiovascular: Regular rhythm.   Pulmonary/Chest: Effort normal and breath sounds normal.  Musculoskeletal: She exhibits edema.       1+ bilateral  Neurological: She is alert. No cranial nerve deficit.       No rest tremor Normal strength in hands  Skin:       5 cm superficial laceration horizontally left mid shin. Scabbed with minimal erythema          Assessment & Plan:

## 2012-04-23 NOTE — Assessment & Plan Note (Signed)
Vitamin B12 shot today

## 2012-04-24 LAB — CBC
Hemoglobin: 11.3 g/dL — ABNORMAL LOW (ref 12.0–15.0)
MCH: 28.8 pg (ref 26.0–34.0)
MCHC: 31.6 g/dL (ref 30.0–36.0)
Platelets: 294 10*3/uL (ref 150–400)
RDW: 16.5 % — ABNORMAL HIGH (ref 11.5–15.5)

## 2012-04-24 LAB — VITAMIN D 25 HYDROXY (VIT D DEFICIENCY, FRACTURES): Vit D, 25-Hydroxy: 23 ng/mL — ABNORMAL LOW (ref 30–89)

## 2012-04-24 LAB — COMPREHENSIVE METABOLIC PANEL
AST: 34 U/L (ref 0–37)
Albumin: 3.5 g/dL (ref 3.5–5.2)
Alkaline Phosphatase: 94 U/L (ref 39–117)
BUN: 28 mg/dL — ABNORMAL HIGH (ref 6–23)
Calcium: 9.8 mg/dL (ref 8.4–10.5)
Chloride: 91 mEq/L — ABNORMAL LOW (ref 96–112)
Potassium: 4.3 mEq/L (ref 3.5–5.3)
Sodium: 133 mEq/L — ABNORMAL LOW (ref 135–145)
Total Protein: 6.2 g/dL (ref 6.0–8.3)

## 2012-04-24 MED ORDER — ERGOCALCIFEROL 1.25 MG (50000 UT) PO CAPS: 50000.0000 [IU] | ORAL_CAPSULE | ORAL | Status: DC

## 2012-04-24 NOTE — Assessment & Plan Note (Signed)
Replace ° °

## 2012-04-24 NOTE — Assessment & Plan Note (Signed)
Not infected, but needs tetanus booster

## 2012-04-24 NOTE — Assessment & Plan Note (Addendum)
Less well controlled. Will increase Glipizide to 15 mg daily

## 2012-04-24 NOTE — Assessment & Plan Note (Signed)
Pneumonia and possible UTI apparently resolved.

## 2012-04-24 NOTE — Assessment & Plan Note (Signed)
Needs Vitamin D replacement

## 2012-04-26 ENCOUNTER — Inpatient Hospital Stay (HOSPITAL_COMMUNITY)
Admission: EM | Admit: 2012-04-26 | Discharge: 2012-04-30 | DRG: 556 | Disposition: A | Discharge status: Skilled Nursing Facility | Payer: Medicare HMO | Attending: Family Medicine | Admitting: Family Medicine

## 2012-04-26 ENCOUNTER — Emergency Department (HOSPITAL_COMMUNITY): Payer: Medicare HMO

## 2012-04-26 ENCOUNTER — Telehealth: Payer: Self-pay | Admitting: Family Medicine

## 2012-04-26 ENCOUNTER — Encounter (HOSPITAL_COMMUNITY): Payer: Self-pay | Admitting: Family Medicine

## 2012-04-26 DIAGNOSIS — L89609 Pressure ulcer of unspecified heel, unspecified stage: Secondary | ICD-10-CM | POA: Diagnosis present

## 2012-04-26 DIAGNOSIS — L8990 Pressure ulcer of unspecified site, unspecified stage: Secondary | ICD-10-CM

## 2012-04-26 DIAGNOSIS — E1142 Type 2 diabetes mellitus with diabetic polyneuropathy: Secondary | ICD-10-CM | POA: Diagnosis present

## 2012-04-26 DIAGNOSIS — Z6841 Body Mass Index (BMI) 40.0 and over, adult: Secondary | ICD-10-CM

## 2012-04-26 DIAGNOSIS — R5381 Other malaise: Secondary | ICD-10-CM | POA: Diagnosis present

## 2012-04-26 DIAGNOSIS — L8992 Pressure ulcer of unspecified site, stage 2: Secondary | ICD-10-CM | POA: Diagnosis present

## 2012-04-26 DIAGNOSIS — R296 Repeated falls: Secondary | ICD-10-CM

## 2012-04-26 DIAGNOSIS — E785 Hyperlipidemia, unspecified: Secondary | ICD-10-CM | POA: Diagnosis present

## 2012-04-26 DIAGNOSIS — G4733 Obstructive sleep apnea (adult) (pediatric): Secondary | ICD-10-CM | POA: Diagnosis present

## 2012-04-26 DIAGNOSIS — N183 Chronic kidney disease, stage 3 unspecified: Secondary | ICD-10-CM | POA: Diagnosis present

## 2012-04-26 DIAGNOSIS — R29898 Other symptoms and signs involving the musculoskeletal system: Principal | ICD-10-CM | POA: Diagnosis present

## 2012-04-26 DIAGNOSIS — I10 Essential (primary) hypertension: Secondary | ICD-10-CM | POA: Diagnosis present

## 2012-04-26 DIAGNOSIS — M1A00X0 Idiopathic chronic gout, unspecified site, without tophus (tophi): Secondary | ICD-10-CM | POA: Diagnosis present

## 2012-04-26 DIAGNOSIS — R739 Hyperglycemia, unspecified: Secondary | ICD-10-CM

## 2012-04-26 DIAGNOSIS — Z9119 Patient's noncompliance with other medical treatment and regimen: Secondary | ICD-10-CM

## 2012-04-26 DIAGNOSIS — E662 Morbid (severe) obesity with alveolar hypoventilation: Secondary | ICD-10-CM | POA: Diagnosis present

## 2012-04-26 DIAGNOSIS — Z96659 Presence of unspecified artificial knee joint: Secondary | ICD-10-CM

## 2012-04-26 DIAGNOSIS — I129 Hypertensive chronic kidney disease with stage 1 through stage 4 chronic kidney disease, or unspecified chronic kidney disease: Secondary | ICD-10-CM | POA: Diagnosis present

## 2012-04-26 DIAGNOSIS — F17200 Nicotine dependence, unspecified, uncomplicated: Secondary | ICD-10-CM | POA: Diagnosis present

## 2012-04-26 DIAGNOSIS — R627 Adult failure to thrive: Secondary | ICD-10-CM | POA: Diagnosis present

## 2012-04-26 DIAGNOSIS — Z961 Presence of intraocular lens: Secondary | ICD-10-CM

## 2012-04-26 DIAGNOSIS — R531 Weakness: Secondary | ICD-10-CM | POA: Diagnosis present

## 2012-04-26 DIAGNOSIS — E559 Vitamin D deficiency, unspecified: Secondary | ICD-10-CM | POA: Diagnosis present

## 2012-04-26 DIAGNOSIS — J45909 Unspecified asthma, uncomplicated: Secondary | ICD-10-CM | POA: Diagnosis present

## 2012-04-26 DIAGNOSIS — Z91199 Patient's noncompliance with other medical treatment and regimen due to unspecified reason: Secondary | ICD-10-CM

## 2012-04-26 DIAGNOSIS — M1A9XX0 Chronic gout, unspecified, without tophus (tophi): Secondary | ICD-10-CM | POA: Diagnosis present

## 2012-04-26 DIAGNOSIS — Z9849 Cataract extraction status, unspecified eye: Secondary | ICD-10-CM

## 2012-04-26 DIAGNOSIS — E1149 Type 2 diabetes mellitus with other diabetic neurological complication: Secondary | ICD-10-CM | POA: Diagnosis present

## 2012-04-26 LAB — URINALYSIS, ROUTINE W REFLEX MICROSCOPIC
Bilirubin Urine: NEGATIVE
Glucose, UA: 500 mg/dL — AB
Hgb urine dipstick: NEGATIVE
Specific Gravity, Urine: 1.016 (ref 1.005–1.030)
pH: 6 (ref 5.0–8.0)

## 2012-04-26 LAB — COMPREHENSIVE METABOLIC PANEL
ALT: 23 U/L (ref 0–35)
AST: 30 U/L (ref 0–37)
Albumin: 3.2 g/dL — ABNORMAL LOW (ref 3.5–5.2)
Alkaline Phosphatase: 101 U/L (ref 39–117)
Glucose, Bld: 407 mg/dL — ABNORMAL HIGH (ref 70–99)
Potassium: 4.1 mEq/L (ref 3.5–5.1)
Sodium: 133 mEq/L — ABNORMAL LOW (ref 135–145)
Total Protein: 6.8 g/dL (ref 6.0–8.3)

## 2012-04-26 LAB — GLUCOSE, CAPILLARY: Glucose-Capillary: 392 mg/dL — ABNORMAL HIGH (ref 70–99)

## 2012-04-26 LAB — CBC WITH DIFFERENTIAL/PLATELET
Basophils Relative: 1 % (ref 0–1)
Eosinophils Absolute: 0.7 10*3/uL (ref 0.0–0.7)
Lymphs Abs: 1.9 10*3/uL (ref 0.7–4.0)
MCH: 28.7 pg (ref 26.0–34.0)
Neutrophils Relative %: 62 % (ref 43–77)
Platelets: 211 10*3/uL (ref 150–400)
RBC: 3.94 MIL/uL (ref 3.87–5.11)

## 2012-04-26 LAB — LACTIC ACID, PLASMA: Lactic Acid, Venous: 1.5 mmol/L (ref 0.5–2.2)

## 2012-04-26 MED ORDER — SODIUM CHLORIDE 0.9 % IV SOLN
INTRAVENOUS | Status: DC
  Administered 2012-04-26 – 2012-04-27 (×2): via INTRAVENOUS
  Administered 2012-04-27: 1000 mL via INTRAVENOUS

## 2012-04-26 MED ORDER — HEPARIN SODIUM (PORCINE) 5000 UNIT/ML IJ SOLN
5000.0000 [IU] | Freq: Three times a day (TID) | INTRAMUSCULAR | Status: DC
  Administered 2012-04-26 – 2012-04-30 (×12): 5000 [IU] via SUBCUTANEOUS
  Filled 2012-04-26 (×15): qty 1

## 2012-04-26 MED ORDER — ROPINIROLE HCL 1 MG PO TABS: 4.0000 mg | ORAL_TABLET | Freq: Every day | ORAL | Status: DC

## 2012-04-26 MED ORDER — VANCOMYCIN HCL IN DEXTROSE 1-5 GM/200ML-% IV SOLN
1000.0000 mg | Freq: Once | INTRAVENOUS | Status: AC
  Administered 2012-04-26: 1000 mg via INTRAVENOUS
  Filled 2012-04-26: qty 200

## 2012-04-26 MED ORDER — PREGABALIN 50 MG PO CAPS
150.0000 mg | ORAL_CAPSULE | Freq: Every day | ORAL | Status: DC
  Administered 2012-04-26 – 2012-04-29 (×4): 150 mg via ORAL
  Filled 2012-04-26 (×4): qty 3

## 2012-04-26 MED ORDER — INSULIN ASPART 100 UNIT/ML ~~LOC~~ SOLN
0.0000 [IU] | SUBCUTANEOUS | Status: DC
  Administered 2012-04-26: 15 [IU] via SUBCUTANEOUS
  Administered 2012-04-27: 8 [IU] via SUBCUTANEOUS
  Administered 2012-04-27: 2 [IU] via SUBCUTANEOUS
  Administered 2012-04-27 (×2): 8 [IU] via SUBCUTANEOUS
  Administered 2012-04-28: 3 [IU] via SUBCUTANEOUS
  Administered 2012-04-28: 8 [IU] via SUBCUTANEOUS
  Administered 2012-04-28: 2 [IU] via SUBCUTANEOUS
  Administered 2012-04-28 (×2): 8 [IU] via SUBCUTANEOUS
  Administered 2012-04-28: 3 [IU] via SUBCUTANEOUS
  Administered 2012-04-29: 15 [IU] via SUBCUTANEOUS
  Administered 2012-04-29 (×2): 8 [IU] via SUBCUTANEOUS
  Administered 2012-04-29: 5 [IU] via SUBCUTANEOUS
  Administered 2012-04-29 (×2): 8 [IU] via SUBCUTANEOUS
  Administered 2012-04-30 (×2): 5 [IU] via SUBCUTANEOUS
  Administered 2012-04-30: 8 [IU] via SUBCUTANEOUS
  Administered 2012-04-30 (×2): 3 [IU] via SUBCUTANEOUS
  Filled 2012-04-26: qty 1

## 2012-04-26 MED ORDER — CITALOPRAM HYDROBROMIDE 20 MG PO TABS
20.0000 mg | ORAL_TABLET | Freq: Every day | ORAL | Status: DC
  Administered 2012-04-27 – 2012-04-30 (×4): 20 mg via ORAL
  Filled 2012-04-26 (×4): qty 1

## 2012-04-26 MED ORDER — SODIUM CHLORIDE 0.9 % IJ SOLN
3.0000 mL | Freq: Two times a day (BID) | INTRAMUSCULAR | Status: DC
  Administered 2012-04-27 – 2012-04-29 (×5): 3 mL via INTRAVENOUS

## 2012-04-26 MED ORDER — METOPROLOL SUCCINATE ER 25 MG PO TB24
25.0000 mg | ORAL_TABLET | Freq: Every day | ORAL | Status: DC
  Administered 2012-04-27 – 2012-04-30 (×3): 25 mg via ORAL
  Filled 2012-04-26 (×4): qty 1

## 2012-04-26 MED ORDER — SODIUM CHLORIDE 0.9 % IV SOLN
Freq: Once | INTRAVENOUS | Status: AC
  Administered 2012-04-27: via INTRAVENOUS

## 2012-04-26 MED ORDER — ACETAMINOPHEN 325 MG PO TABS
650.0000 mg | ORAL_TABLET | Freq: Four times a day (QID) | ORAL | Status: DC | PRN
  Administered 2012-04-27 – 2012-04-30 (×5): 650 mg via ORAL
  Filled 2012-04-26 (×5): qty 2

## 2012-04-26 MED ORDER — ASPIRIN EC 81 MG PO TBEC
81.0000 mg | DELAYED_RELEASE_TABLET | Freq: Every day | ORAL | Status: DC
  Administered 2012-04-27 – 2012-04-30 (×4): 81 mg via ORAL
  Filled 2012-04-26 (×4): qty 1

## 2012-04-26 MED ORDER — ACETAMINOPHEN 650 MG RE SUPP: 650.0000 mg | Freq: Four times a day (QID) | RECTAL | Status: DC | PRN

## 2012-04-26 MED ORDER — SODIUM CHLORIDE 0.9 % IV BOLUS (SEPSIS)
2000.0000 mL | Freq: Once | INTRAVENOUS | Status: AC
  Administered 2012-04-26: 2000 mL via INTRAVENOUS

## 2012-04-26 MED ORDER — SIMVASTATIN 20 MG PO TABS
20.0000 mg | ORAL_TABLET | Freq: Every day | ORAL | Status: DC
  Administered 2012-04-26 – 2012-04-29 (×4): 20 mg via ORAL
  Filled 2012-04-26 (×6): qty 1

## 2012-04-26 NOTE — Telephone Encounter (Signed)
Daughter was expecting to hear about labs drawn on 10/29 and has not heard.  Reports patient's BS this AM fasting was 334.  Unable to hold things with hands,  can't hold on to walker   "hips giving out. " By that she means hips  are jerky she states. . Sleeping more. Will forward message to Dr. Sheffield Slider.

## 2012-04-26 NOTE — H&P (Signed)
[name] [name] is an 78 y.o. female.   Chief Complaint: generalized weakness HPI:  78 yo female with h/o DM2, OSA (non compliant with CPAP), HTN and recent hospitalization for UTI, bacteremia and pneumonia who was brought in by her daughter for evaluation of generalized weakness. Patient had been able to walk using a walker until 2 days ago at which point she started feeling weaker, unable to transfer out of the chair and eventually unable to transfer out of bed. She has had some associated falls secondary to weakness, but no head trauma associated with it. She does complain of mild headache since yesterday. She has had some intermittent changes in mental status, although today she is at her normal baseline. She also reports some myoclonic jerks of her hands and left hip, associated with loss of balance and lasting a few seconds. The hand myoclonic jerks have been present for a few weeks and occur when she is holding something (like a cup) in her hands. the hip jerk was new as of today.   During her hospitalization in early October, she was also found to have generalized weakness and was recommended SNF placement which she and her daughter refused. Per her daughter's report, she had been slowly improving in strength to the point of being able to walk with her walker, until her more recent decline.  She denies any chest pain, new onset shortness of breath, any abdominal pain, nausea, or vomiting. She denies any fevers. She had finished her outpatient antibiotic course for bacteremia/UTI. She has been eating and drinking normally. She reports a little sore on her right heel which has been present for several days.  Her glucose at home has been ranging between 250 and 299. She takes glipizide 5mg  daily at home.  In the ED, she was found to have generalized weakness and failure to thrive and was admitted for further evaluation.   Past Medical History  Diagnosis Date  . History of cystoscopy 08/2005   normal  . Asthma   . Hypertension   . Diabetes mellitus without complication   . Neuropathy     Past Surgical History  Procedure Date  . Cataract extraction w/ intraocular lens implant 2012    bilateral  . Total knee arthroplasty 02/2010    left    Family History  Problem Relation Age of Onset  . Alcohol abuse Son   . Alcohol abuse Daughter     died  . Diabetes Mother   . Diabetes Daughter   . Diabetes Sister   . Coronary artery disease Sister     MI  . Alzheimer's disease Sister     died of heart proble  . Diabetes Daughter   . Diabetes Daughter   . Diabetes Son   . Diabetes Son    Social History:  reports that she has been passively smoking.  She does not have any smokeless tobacco history on file. She reports that she does not drink alcohol or use illicit drugs.  Allergies:  Allergies  Allergen Reactions  . Cephalexin     REACTION: oral ulcerations, vomiting diarrhea  . Codeine Itching and Nausea And Vomiting  . Enalapril Maleate     REACTION: Facial swelling and urticaria and blisters on lips  . Mirtazapine     REACTION: edema per patient     (Not in a hospital admission)  Results for orders placed during the hospital encounter of 04/26/12 (from the past 48 hour(s))  GLUCOSE, CAPILLARY  Status: Abnormal   Collection Time   04/26/12  1:06 PM      Component Value Range Comment   Glucose-Capillary 392 (*) 70 - 99 mg/dL   LACTIC ACID, PLASMA     Status: Normal   Collection Time   04/26/12  4:04 PM      Component Value Range Comment   Lactic Acid, Venous 1.5  0.5 - 2.2 mmol/L   URINALYSIS, ROUTINE W REFLEX MICROSCOPIC     Status: Abnormal   Collection Time   04/26/12  4:04 PM      Component Value Range Comment   Color, Urine YELLOW  YELLOW    APPearance CLOUDY (*) CLEAR    Specific Gravity, Urine 1.016  1.005 - 1.030    pH 6.0  5.0 - 8.0    Glucose, UA 500 (*) NEGATIVE mg/dL    Hgb urine dipstick NEGATIVE  NEGATIVE    Bilirubin Urine NEGATIVE   NEGATIVE    Ketones, ur NEGATIVE  NEGATIVE mg/dL    Protein, ur NEGATIVE  NEGATIVE mg/dL    Urobilinogen, UA 0.2  0.0 - 1.0 mg/dL    Nitrite NEGATIVE  NEGATIVE    Leukocytes, UA MODERATE (*) NEGATIVE   URINE MICROSCOPIC-ADD ON     Status: Normal   Collection Time   04/26/12  4:04 PM      Component Value Range Comment   Squamous Epithelial / LPF RARE  RARE    WBC, UA 3-6  <3 WBC/hpf    RBC / HPF 0-2  <3 RBC/hpf    Bacteria, UA RARE  RARE    Urine-Other MANY YEAST     CBC WITH DIFFERENTIAL     Status: Abnormal   Collection Time   04/26/12  4:06 PM      Component Value Range Comment   WBC 7.9  4.0 - 10.5 K/uL    RBC 3.94  3.87 - 5.11 MIL/uL    Hemoglobin 11.3 (*) 12.0 - 15.0 g/dL    HCT 62.1  30.8 - 65.7 %    MCV 92.4  78.0 - 100.0 fL    MCH 28.7  26.0 - 34.0 pg    MCHC 31.0  30.0 - 36.0 g/dL    RDW 84.6 (*) 96.2 - 15.5 %    Platelets 211  150 - 400 K/uL    Neutrophils Relative 62  43 - 77 %    Neutro Abs 5.0  1.7 - 7.7 K/uL    Lymphocytes Relative 24  12 - 46 %    Lymphs Abs 1.9  0.7 - 4.0 K/uL    Monocytes Relative 4  3 - 12 %    Monocytes Absolute 0.3  0.1 - 1.0 K/uL    Eosinophils Relative 9 (*) 0 - 5 %    Eosinophils Absolute 0.7  0.0 - 0.7 K/uL    Basophils Relative 1  0 - 1 %    Basophils Absolute 0.0  0.0 - 0.1 K/uL   COMPREHENSIVE METABOLIC PANEL     Status: Abnormal   Collection Time   04/26/12  4:06 PM      Component Value Range Comment   Sodium 133 (*) 135 - 145 mEq/L    Potassium 4.1  3.5 - 5.1 mEq/L    Chloride 91 (*) 96 - 112 mEq/L    CO2 35 (*) 19 - 32 mEq/L    Glucose, Bld 407 (*) 70 - 99 mg/dL    BUN 30 (*) 6 - 23  mg/dL    Creatinine, Ser 0.98 (*) 0.50 - 1.10 mg/dL    Calcium 9.3  8.4 - 11.9 mg/dL    Total Protein 6.8  6.0 - 8.3 g/dL    Albumin 3.2 (*) 3.5 - 5.2 g/dL    AST 30  0 - 37 U/L    ALT 23  0 - 35 U/L    Alkaline Phosphatase 101  39 - 117 U/L    Total Bilirubin 0.3  0.3 - 1.2 mg/dL    GFR calc non Af Amer 33 (*) >90 mL/min    GFR calc Af  Amer 39 (*) >90 mL/min   AMMONIA     Status: Normal   Collection Time   04/26/12  4:06 PM      Component Value Range Comment   Ammonia 18  11 - 60 umol/L    Dg Chest 2 View  04/26/2012  *RADIOLOGY REPORT*  Clinical Data: Tremors, hyperglycemia  CHEST - 2 VIEW  Comparison: Chest x-ray of 04/06/2012  Findings: The lungs appear better aerated than on the prior chest x- ray.  No pneumonia is seen.  There is some opacity at the left cardiophrenic angle on the frontal view and follow-up is recommended to exclude pneumonia.  Mild cardiomegaly is stable.  No bony abnormality is seen other than degenerative change in the thoracic spine and right shoulder.  IMPRESSION: No definite pneumonia.  Minimal opacity at the left cardiophrenic angle.  Consider follow-up chest x-ray.   Original Report Authenticated By: Dwyane Dee, M.D.    Ct Head Wo Contrast  04/26/2012  *RADIOLOGY REPORT*  Clinical Data:  Fall, weakness, tingling  CT HEAD WITHOUT CONTRAST CT CERVICAL SPINE WITHOUT CONTRAST  Technique:  Multidetector CT imaging of the head and cervical spine was performed following the standard protocol without intravenous contrast.  Multiplanar CT image reconstructions of the cervical spine were also generated.  Comparison:  02/22/2010.  CT HEAD  Findings: No evidence of parenchymal hemorrhage or extra-axial fluid collection. No mass lesion, mass effect, or midline shift.  No CT evidence of acute infarction.  Mildly Subcortical white matter and periventricular small vessel ischemic changes.  Intracranial atherosclerosis.  Age related atrophy.  Secondary ventricular prominence.  Mucous retention cyst in the left maxillary sinus.  Visualized paranasal sinuses and mastoid air cells are otherwise clear.  No evidence of calvarial fracture.  IMPRESSION: No evidence of acute intracranial abnormality.  Small vessel ischemic changes with intracranial atherosclerosis.  CT CERVICAL SPINE  Findings: Small cervical lordosis.  No evidence  of fracture or dislocation.  Vertebral body heights are maintained.  The dens appears intact.  No prevertebral soft tissue swelling.  Extensive multilevel degenerative changes, most severe at C5-6.  No prevertebral soft tissue swelling.  Probable synovial cyst versus distension of the joint capsule at the right C1-2 level (series 8/image 26), unchanged.  Visualized thyroid is unremarkable.  Visualized lung apices are essentially clear.  IMPRESSION: No evidence of traumatic injury to the cervical spine.  Extensive multilevel degenerative changes, most severe at C5-6.   Original Report Authenticated By: Charline Bills, M.D.    Ct Cervical Spine Wo Contrast  04/26/2012  *RADIOLOGY REPORT*  Clinical Data:  Fall, weakness, tingling  CT HEAD WITHOUT CONTRAST CT CERVICAL SPINE WITHOUT CONTRAST  Technique:  Multidetector CT imaging of the head and cervical spine was performed following the standard protocol without intravenous contrast.  Multiplanar CT image reconstructions of the cervical spine were also generated.  Comparison:  02/22/2010.  CT  HEAD  Findings: No evidence of parenchymal hemorrhage or extra-axial fluid collection. No mass lesion, mass effect, or midline shift.  No CT evidence of acute infarction.  Mildly Subcortical white matter and periventricular small vessel ischemic changes.  Intracranial atherosclerosis.  Age related atrophy.  Secondary ventricular prominence.  Mucous retention cyst in the left maxillary sinus.  Visualized paranasal sinuses and mastoid air cells are otherwise clear.  No evidence of calvarial fracture.  IMPRESSION: No evidence of acute intracranial abnormality.  Small vessel ischemic changes with intracranial atherosclerosis.  CT CERVICAL SPINE  Findings: Small cervical lordosis.  No evidence of fracture or dislocation.  Vertebral body heights are maintained.  The dens appears intact.  No prevertebral soft tissue swelling.  Extensive multilevel degenerative changes, most severe at  C5-6.  No prevertebral soft tissue swelling.  Probable synovial cyst versus distension of the joint capsule at the right C1-2 level (series 8/image 26), unchanged.  Visualized thyroid is unremarkable.  Visualized lung apices are essentially clear.  IMPRESSION: No evidence of traumatic injury to the cervical spine.  Extensive multilevel degenerative changes, most severe at C5-6.   Original Report Authenticated By: Charline Bills, M.D.     Review of Systems  Constitutional: Positive for malaise/fatigue. Negative for fever and chills.  HENT: Positive for neck pain.   Eyes: Negative for blurred vision and double vision.  Respiratory: Negative for cough, shortness of breath and wheezing.   Cardiovascular: Negative for chest pain, palpitations and leg swelling.  Gastrointestinal: Negative for nausea, vomiting and abdominal pain.  Genitourinary: Negative for dysuria, urgency and frequency.  Musculoskeletal: Negative for myalgias.  Skin: Negative for rash.  Neurological: Positive for weakness and headaches. Negative for dizziness, seizures and loss of consciousness.  Psychiatric/Behavioral: Negative for depression.    Blood pressure 120/53, pulse 58, resp. rate 18, SpO2 99.00%. Physical Exam  Constitutional: She is oriented to person, place, and time.       Chronically ill appearing, no acute distress.  HENT:  Nose: Nose normal.  Mouth/Throat: Oropharynx is clear and moist.  Eyes: EOM are normal. Pupils are equal, round, and reactive to light.  Neck: Normal range of motion. Neck supple. No thyromegaly present.  Cardiovascular: Normal rate, regular rhythm and normal heart sounds.   Respiratory: Effort normal and breath sounds normal. No respiratory distress. She has no wheezes.  GI: Soft. Bowel sounds are normal. She exhibits no distension. There is no tenderness.  Musculoskeletal: She exhibits no edema.  Neurological: She is alert and oriented to person, place, and time. No cranial nerve  deficit.       CN2-12 grossly intact, finger to nose intact, no tremor noted, negative pronator drip, 4+/5 strength in lower extremities bilaterally, 4+/5 hand grip  Skin:       2 cm vesicle on right heel, tender to palpation, non erythematous  Psychiatric: She has a normal mood and affect. Her behavior is normal.     Assessment/Plan 78 yo female with h/o HTN, DM2, OSA (not on cpap at home) who presents with generalized weakness and failure to thrive.   1. Weakness: likely multifactorial. She has a history of restless leg syndrome and chronic weakness at baseline for which she was evaluated at her latest hospitalization when SNF was recommended. With her new onset weakness and report of occasional falls, CT head and neck did not show any acute processes. Will evaluate for infectious source as possible exacerbation of further decline in strength.  - f/u blood and urine cultures. UA and  CXR not convincing for infectious at this point. No obvious cellulitis on right heel. Will hold antibiotics for now, given that patient is afebrile with normal WBC - PT/OT eval for recommendation on placement. Had recommended Inpatient rehab which insurance doesn't cover and patient's daughter had refused SNF. - continue home lyrica and gabapentin - monitor myoclonic movements  2. Obstructive sleep apnea and likely obesity hypoventilation syndrome. Evidence of elevated bicarb on BMP. Did not obtain ABG on admission since patient was mentating well and not showing any evidence of acute respiratory failure.  - CPAP overnight for sleep apnea, if patient can tolerate - O2 as needed for O2<92%. Patient is on home O2 at night time.   3. Hyperglycemia: elevated CBG's on admit. On glipizide at home. - start ISS and monitor CBG's.   4. heel ulcer: pressure ulcer - monitor for any signs of infection - wound care  5. H/o HTN: controled on admission Restart home metoprolol 6. CKD: creatinine at baseline: baseline  1.2-1.4 - monitor morning BMP 7. Fen/Gi: carb modified diet, NS at 50cc/hr 8. PPx: heparin sub cu 9. Dispo: admit to tele floor for further evaluation of weakness and failure to thrive  Savonna Birchmeier 04/26/2012, 10:43 PM

## 2012-04-26 NOTE — Telephone Encounter (Signed)
The nurse is calling about getting an Rx for Incontinence Supplies with the diagnosis code for Incontinence.  Fax to [phone]

## 2012-04-26 NOTE — Telephone Encounter (Signed)
Faxed order .[facility]

## 2012-04-26 NOTE — Telephone Encounter (Signed)
I spoke with [name] who says her mother is increasingly lethargic, tremulous, and legs giving out when she stands. She took 2 Tramadol last night, but has been only using it occasionally. She is wearing nocturnal O2, but hasn't used CPAP recently and they don't have the mask and machine. (Carolyin uses one herself). Glucose remains high on the increased Glipizide.   Her labs showed mild hyponatremia, normal LFT's and mild increase in creatinine.   I recommended that she be taken to the [facility] ER for ABG and ammonia level. We may need to admit her for institution of insulin and CPAP.

## 2012-04-26 NOTE — ED Notes (Signed)
SPO2 noted to be 90% on RA, 2L [place] placed.

## 2012-04-26 NOTE — ED Notes (Signed)
Pt states she has been feeling weak for "a couple days" and has had multiple falls at home. She states she has tingling in her bilateral extremities but no decreased sensation.

## 2012-04-26 NOTE — ED Provider Notes (Signed)
History     CSN: [id number]  Arrival date & time 04/26/12  1228   First MD Initiated Contact with Patient 04/26/12 1512      Chief Complaint  Patient presents with  . Tremors  . Hyperglycemia    (Consider location/radiation/quality/duration/timing/severity/associated sxs/prior treatment) HPI This 78 year old female has a history of sleep apnea, obesity, diabetes, peripheral neuropathy in her feet with chronic painful tingling feet, generalized weakness, recent admission within the last month for pneumonia and urinary tract infection, finished her antibiotics, since discharge from the hospital refused nursing home placement at that time, attempted to live back with family at home but has gradually become weaker again over the last few weeks, her family is unable to care for the patient now at home, the patient has had recurrent multiple falls in the last few weeks just like prior to her last hospitalization, she has generalized weakness and now is unable to sit or stand on her own even with her walker, she is also fluctuating mild confusion intermittently over the last few weeks since her return home, she did not have trauma yesterday to her head but has had a gradual onset mild headache since yesterday, she is no altered mental status today, she has had some chronic neck pain for the last few weeks, she has generalized weakness without focal or lateralizing weakness or numbness, she is no change in mental status today, she is no change in speech vision swallowing or understanding, she is no chest pain cough shortness breath abdominal pain vomiting diarrhea or bloody stools. She has had generalized myoclonic jerks either her hands or feet or legs for several weeks lasting fraction of a second at a time. She now has a few days of constant painful redness to her right heel surrounding the blister without ulcer or purulent drainage. There is no treatment prior to arrival. [facility] medicine service  anticipates the patient may need admission again. Patient is blood sugars consistently over 250 last few weeks.  Pt has home O2 but not CPAP although is supposed to use it. Past Medical History  Diagnosis Date  . History of cystoscopy 08/2005    normal  . Asthma   . Hypertension   . Diabetes mellitus without complication   . Neuropathy     Past Surgical History  Procedure Date  . Cataract extraction w/ intraocular lens implant 2012    bilateral  . Total knee arthroplasty 02/2010    left    Family History  Problem Relation Age of Onset  . Alcohol abuse Son   . Alcohol abuse Daughter     died  . Diabetes Mother   . Diabetes Daughter   . Diabetes Sister   . Coronary artery disease Sister     MI  . Alzheimer's disease Sister     died of heart proble  . Diabetes Daughter   . Diabetes Daughter   . Diabetes Son   . Diabetes Son     History  Substance Use Topics  . Smoking status: Passive Smoke Exposure - Never Smoker  . Smokeless tobacco: Not on file  . Alcohol Use: No    OB History    Grav Para Term Preterm Abortions TAB SAB Ect Mult Living                  Review of Systems 10 Systems reviewed and are negative for acute change except as noted in the HPI. Allergies  Cephalexin; Codeine; Enalapril maleate; and Mirtazapine  Home Medications   No current outpatient prescriptions on file.  BP 138/79  Pulse 59  Temp 98.5 F (36.9 C) (Oral)  Resp 18  Ht 5' (1.524 m)  Wt 225 lb (102.059 kg)  BMI 43.94 kg/m2  SpO2 98%  Physical Exam  Nursing note and vitals reviewed. Constitutional:       Awake, alert, nontoxic appearance with baseline speech for patient.  HENT:  Head: Atraumatic.  Mouth/Throat: No oropharyngeal exudate.  Eyes: EOM are normal. Pupils are equal, round, and reactive to light. Right eye exhibits no discharge. Left eye exhibits no discharge.  Neck: Neck supple.  Cardiovascular: Normal rate and regular rhythm.   No murmur  heard. Pulmonary/Chest: Effort normal and breath sounds normal. No stridor. No respiratory distress. She has no wheezes. She has no rales. She exhibits no tenderness.  Abdominal: Soft. Bowel sounds are normal. She exhibits no mass. There is no tenderness. There is no rebound.  Musculoskeletal: She exhibits tenderness. She exhibits no edema.       Baseline ROM, moves extremities with no obvious new focal weakness.CR<2secs all 4 ext.  Right foot heel with 2cm vessicle with mild surrounding cellulitis without ulcer or purulence. Baseline numbness both feet.  Lymphadenopathy:    She has no cervical adenopathy.  Neurological: She is alert.       Awake, alert, cooperative and aware of situation; motor strength 4/5 bilaterally; sensation normal to light touch bilaterally baseline decreased feet; peripheral visual fields full to confrontation; no facial asymmetry; tongue midline; major cranial nerves appear intact; no pronator drift, normal finger to nose bilaterally, too weak to sit or stand in ED  Skin: No rash noted.  Psychiatric: She has a normal mood and affect.    ED Course  Procedures (including critical care time)  ECG: Sinus rhythm, ventricular rate 61, normal axis, nonspecific T wave changes anterior leads, no significant change noted compared with 04/05/2012  Plan move to CDU then consult [facility] when inaging/labs back.  Medical screening examination/treatment/procedure(s) were conducted as a shared visit with non-physician practitioner(s) and myself.  I personally evaluated the patient during the encounter. Labs Reviewed  GLUCOSE, CAPILLARY - Abnormal; Notable for the following:    Glucose-Capillary 392 (*)     All other components within normal limits  CBC WITH DIFFERENTIAL - Abnormal; Notable for the following:    Hemoglobin 11.3 (*)     RDW 15.8 (*)     Eosinophils Relative 9 (*)     All other components within normal limits  COMPREHENSIVE METABOLIC PANEL - Abnormal; Notable for the  following:    Sodium 133 (*)     Chloride 91 (*)     CO2 35 (*)     Glucose, Bld 407 (*)     BUN 30 (*)     Creatinine, Ser 1.47 (*)     Albumin 3.2 (*)     GFR calc non Af Amer 33 (*)     GFR calc Af Amer 39 (*)     All other components within normal limits  URINALYSIS, ROUTINE W REFLEX MICROSCOPIC - Abnormal; Notable for the following:    APPearance CLOUDY (*)     Glucose, UA 500 (*)     Leukocytes, UA MODERATE (*)     All other components within normal limits  BASIC METABOLIC PANEL - Abnormal; Notable for the following:    CO2 34 (*)     Glucose, Bld 105 (*)     BUN 24 (*)  Creatinine, Ser 1.32 (*)     GFR calc non Af Amer 38 (*)     GFR calc Af Amer 44 (*)     All other components within normal limits  CBC - Abnormal; Notable for the following:    RBC 3.50 (*)     Hemoglobin 10.1 (*)     HCT 32.3 (*)     RDW 15.7 (*)     All other components within normal limits  GLUCOSE, CAPILLARY - Abnormal; Notable for the following:    Glucose-Capillary 404 (*)     All other components within normal limits  GLUCOSE, CAPILLARY - Abnormal; Notable for the following:    Glucose-Capillary 111 (*)     All other components within normal limits  GLUCOSE, CAPILLARY - Abnormal; Notable for the following:    Glucose-Capillary 150 (*)     All other components within normal limits  GLUCOSE, CAPILLARY - Abnormal; Notable for the following:    Glucose-Capillary 266 (*)     All other components within normal limits  CBC - Abnormal; Notable for the following:    RBC 3.50 (*)     Hemoglobin 10.2 (*)     HCT 32.2 (*)     RDW 15.7 (*)     All other components within normal limits  BASIC METABOLIC PANEL - Abnormal; Notable for the following:    Glucose, Bld 194 (*)     Creatinine, Ser 1.28 (*)     GFR calc non Af Amer 40 (*)     GFR calc Af Amer 46 (*)     All other components within normal limits  GLUCOSE, CAPILLARY - Abnormal; Notable for the following:    Glucose-Capillary 293 (*)       All other components within normal limits  GLUCOSE, CAPILLARY - Abnormal; Notable for the following:    Glucose-Capillary 290 (*)     All other components within normal limits  GLUCOSE, CAPILLARY - Abnormal; Notable for the following:    Glucose-Capillary 138 (*)     All other components within normal limits  GLUCOSE, CAPILLARY - Abnormal; Notable for the following:    Glucose-Capillary 190 (*)     All other components within normal limits  GLUCOSE, CAPILLARY - Abnormal; Notable for the following:    Glucose-Capillary 176 (*)     All other components within normal limits  GLUCOSE, CAPILLARY - Abnormal; Notable for the following:    Glucose-Capillary 257 (*)     All other components within normal limits  GLUCOSE, CAPILLARY - Abnormal; Notable for the following:    Glucose-Capillary 277 (*)     All other components within normal limits  LACTIC ACID, PLASMA  CULTURE, BLOOD (ROUTINE X 2)  CULTURE, BLOOD (ROUTINE X 2)  AMMONIA  URINE CULTURE  URINE MICROSCOPIC-ADD ON  TROPONIN I  RPR   Mr Cervical Spine Wo Contrast  04/27/2012  *RADIOLOGY REPORT*  Clinical Data: Recent falls, now complains of bilateral arm weakness.  History of generalized weakness.  History of hypertension and diabetes.  History obstructive sleep apnea with obesity.  Failure to thrive.  MRI CERVICAL SPINE WITHOUT CONTRAST  Technique:  Multiplanar and multiecho pulse sequences of the cervical spine, to include the craniocervical junction and cervicothoracic junction, were obtained according to standard protocol without intravenous contrast.  Comparison: CT cervical spine without contrast 04/26/2012.  Findings: The patient is unable to lay flat for the study. Furthermore, due to the body habitus, the anterior neck coil could not  fit.  The resulting images are suboptimal.  The alignment is anatomic.  There is multilevel disc space narrowing from C3-C7 with functional fusion at C5-C6.  Central protrusions resulting in spinal  stenosis are present at C2-3, C3-4, and C4-5.  There is no definite abnormal cord signal.  Narrowing of the spinal canal appears most severe at C3-4 with sagittal diameter approximately 7 mm.  There is no visible intraspinal hematoma. Mild pannus surrounds the odontoid.  There is no observed cervical spine fracture or traumatic subluxation.  Multilevel facet arthropathy is seen.  At C1-C2 on the right, there is a ventrally located extradural water density structure approximately 10 x 10 x 17 mm  (R-L x A-P x C-C).  I believe this is a synovial cyst related to the right C1-2 articulation.  There is no resultant cord compression or spinal stenosis.  Compared with prior CT, similar appearance is noted.  IMPRESSION: Multilevel spondylosis as described.  Central protrusions resulting in spinal stenosis most notable at C3-C4.  Non compressive synovial cyst C1-2, right.  No visible cervical spine fracture or intraspinal hematoma.  No abnormal cord signal.   Original Report Authenticated By: Davonna Belling, M.D.      1. FTT (failure to thrive) in adult   2. Falls frequently   3. Pressure ulcer   4. Hyperglycemia without ketosis   weakness Cellulitis delerium    MDM  Patient / Family / Caregiver understand and agree with initial ED impression and plan with expectations set for ED visit.        Hurman Horn, MD 04/28/12 [id number]

## 2012-04-26 NOTE — ED Provider Notes (Signed)
[name] [name] is a 78 y.o. female in CDU pending the labs and imaging results from pod A. Sign out from Dr. Fonnie Jarvis as follows: Patient with multiple comorbidities recently DC'd from the hospital for pneumonia cellulitis and pyelonephritis presenting today with generalized weakness, failure to thrive, multiple falls, headache she is unable to sit or stand secondary to weakness. Plan is to admit her after workup was completed.  Vision seen and evaluated. She is pain-free at this time. Physical exam shows an obese chronically ill patient who is on oxygen via nasal cannula. PERRLA, lung sounds are clear to auscultation bilaterally, heart is regular rate and rhythm, abdominal exam is benign with no tenderness to palpation, patient is uniformly weak in all extremities approximately 2/5 strength.  Labs show hyperglycemia of 392 but the patient has a normal anion gap.  Results for orders placed during the hospital encounter of 04/26/12  GLUCOSE, CAPILLARY      Component Value Range   Glucose-Capillary 392 (*) 70 - 99 mg/dL  LACTIC ACID, PLASMA      Component Value Range   Lactic Acid, Venous 1.5  0.5 - 2.2 mmol/L  CBC WITH DIFFERENTIAL      Component Value Range   WBC 7.9  4.0 - 10.5 K/uL   RBC 3.94  3.87 - 5.11 MIL/uL   Hemoglobin 11.3 (*) 12.0 - 15.0 g/dL   HCT 16.1  09.6 - 04.5 %   MCV 92.4  78.0 - 100.0 fL   MCH 28.7  26.0 - 34.0 pg   MCHC 31.0  30.0 - 36.0 g/dL   RDW 40.9 (*) 81.1 - 91.4 %   Platelets 211  150 - 400 K/uL   Neutrophils Relative 62  43 - 77 %   Neutro Abs 5.0  1.7 - 7.7 K/uL   Lymphocytes Relative 24  12 - 46 %   Lymphs Abs 1.9  0.7 - 4.0 K/uL   Monocytes Relative 4  3 - 12 %   Monocytes Absolute 0.3  0.1 - 1.0 K/uL   Eosinophils Relative 9 (*) 0 - 5 %   Eosinophils Absolute 0.7  0.0 - 0.7 K/uL   Basophils Relative 1  0 - 1 %   Basophils Absolute 0.0  0.0 - 0.1 K/uL  COMPREHENSIVE METABOLIC PANEL      Component Value Range   Sodium 133 (*) 135 - 145 mEq/L   Potassium 4.1  3.5 - 5.1 mEq/L   Chloride 91 (*) 96 - 112 mEq/L   CO2 35 (*) 19 - 32 mEq/L   Glucose, Bld 407 (*) 70 - 99 mg/dL   BUN 30 (*) 6 - 23 mg/dL   Creatinine, Ser 7.82 (*) 0.50 - 1.10 mg/dL   Calcium 9.3  8.4 - 95.6 mg/dL   Total Protein 6.8  6.0 - 8.3 g/dL   Albumin 3.2 (*) 3.5 - 5.2 g/dL   AST 30  0 - 37 U/L   ALT 23  0 - 35 U/L   Alkaline Phosphatase 101  39 - 117 U/L   Total Bilirubin 0.3  0.3 - 1.2 mg/dL   GFR calc non Af Amer 33 (*) >90 mL/min   GFR calc Af Amer 39 (*) >90 mL/min  AMMONIA      Component Value Range   Ammonia 18  11 - 60 umol/L  URINALYSIS, ROUTINE W REFLEX MICROSCOPIC      Component Value Range   Color, Urine YELLOW  YELLOW   APPearance CLOUDY (*) CLEAR  Specific Gravity, Urine 1.016  1.005 - 1.030   pH 6.0  5.0 - 8.0   Glucose, UA 500 (*) NEGATIVE mg/dL   Hgb urine dipstick NEGATIVE  NEGATIVE   Bilirubin Urine NEGATIVE  NEGATIVE   Ketones, ur NEGATIVE  NEGATIVE mg/dL   Protein, ur NEGATIVE  NEGATIVE mg/dL   Urobilinogen, UA 0.2  0.0 - 1.0 mg/dL   Nitrite NEGATIVE  NEGATIVE   Leukocytes, UA MODERATE (*) NEGATIVE  URINE MICROSCOPIC-ADD ON      Component Value Range   Squamous Epithelial / LPF RARE  RARE   WBC, UA 3-6  <3 WBC/hpf   RBC / HPF 0-2  <3 RBC/hpf   Bacteria, UA RARE  RARE   Urine-Other MANY YEAST     Dg Chest 2 View  04/26/2012  *RADIOLOGY REPORT*  Clinical Data: Tremors, hyperglycemia  CHEST - 2 VIEW  Comparison: Chest x-ray of 04/06/2012  Findings: The lungs appear better aerated than on the prior chest x- ray.  No pneumonia is seen.  There is some opacity at the left cardiophrenic angle on the frontal view and follow-up is recommended to exclude pneumonia.  Mild cardiomegaly is stable.  No bony abnormality is seen other than degenerative change in the thoracic spine and right shoulder.  IMPRESSION: No definite pneumonia.  Minimal opacity at the left cardiophrenic angle.  Consider follow-up chest x-ray.   Original Report  Authenticated By: Dwyane Dee, M.D.    Dg Chest 2 View  04/06/2012  *RADIOLOGY REPORT*  Clinical Data: Pneumonia  CHEST - 2 VIEW  Comparison: Yesterday  Findings: Low lung volumes.  Patchy minimal densities at the left base and right midlung zone laterally have increased.  Small pleural effusions are suspected.  Cardiomegaly.  No pneumothorax.  IMPRESSION: New minimal bilateral patchy airspace opacities.  Small pleural effusions are suspected.   Original Report Authenticated By: Donavan Burnet, M.D.    Ct Head Wo Contrast  04/26/2012  *RADIOLOGY REPORT*  Clinical Data:  Fall, weakness, tingling  CT HEAD WITHOUT CONTRAST CT CERVICAL SPINE WITHOUT CONTRAST  Technique:  Multidetector CT imaging of the head and cervical spine was performed following the standard protocol without intravenous contrast.  Multiplanar CT image reconstructions of the cervical spine were also generated.  Comparison:  02/22/2010.  CT HEAD  Findings: No evidence of parenchymal hemorrhage or extra-axial fluid collection. No mass lesion, mass effect, or midline shift.  No CT evidence of acute infarction.  Mildly Subcortical white matter and periventricular small vessel ischemic changes.  Intracranial atherosclerosis.  Age related atrophy.  Secondary ventricular prominence.  Mucous retention cyst in the left maxillary sinus.  Visualized paranasal sinuses and mastoid air cells are otherwise clear.  No evidence of calvarial fracture.  IMPRESSION: No evidence of acute intracranial abnormality.  Small vessel ischemic changes with intracranial atherosclerosis.  CT CERVICAL SPINE  Findings: Small cervical lordosis.  No evidence of fracture or dislocation.  Vertebral body heights are maintained.  The dens appears intact.  No prevertebral soft tissue swelling.  Extensive multilevel degenerative changes, most severe at C5-6.  No prevertebral soft tissue swelling.  Probable synovial cyst versus distension of the joint capsule at the right C1-2 level  (series 8/image 26), unchanged.  Visualized thyroid is unremarkable.  Visualized lung apices are essentially clear.  IMPRESSION: No evidence of traumatic injury to the cervical spine.  Extensive multilevel degenerative changes, most severe at C5-6.   Original Report Authenticated By: Charline Bills, M.D.    Ct Cervical Spine  Wo Contrast  04/26/2012  *RADIOLOGY REPORT*  Clinical Data:  Fall, weakness, tingling  CT HEAD WITHOUT CONTRAST CT CERVICAL SPINE WITHOUT CONTRAST  Technique:  Multidetector CT imaging of the head and cervical spine was performed following the standard protocol without intravenous contrast.  Multiplanar CT image reconstructions of the cervical spine were also generated.  Comparison:  02/22/2010.  CT HEAD  Findings: No evidence of parenchymal hemorrhage or extra-axial fluid collection. No mass lesion, mass effect, or midline shift.  No CT evidence of acute infarction.  Mildly Subcortical white matter and periventricular small vessel ischemic changes.  Intracranial atherosclerosis.  Age related atrophy.  Secondary ventricular prominence.  Mucous retention cyst in the left maxillary sinus.  Visualized paranasal sinuses and mastoid air cells are otherwise clear.  No evidence of calvarial fracture.  IMPRESSION: No evidence of acute intracranial abnormality.  Small vessel ischemic changes with intracranial atherosclerosis.  CT CERVICAL SPINE  Findings: Small cervical lordosis.  No evidence of fracture or dislocation.  Vertebral body heights are maintained.  The dens appears intact.  No prevertebral soft tissue swelling.  Extensive multilevel degenerative changes, most severe at C5-6.  No prevertebral soft tissue swelling.  Probable synovial cyst versus distension of the joint capsule at the right C1-2 level (series 8/image 26), unchanged.  Visualized thyroid is unremarkable.  Visualized lung apices are essentially clear.  IMPRESSION: No evidence of traumatic injury to the cervical spine.   Extensive multilevel degenerative changes, most severe at C5-6.   Original Report Authenticated By: Charline Bills, M.D.    US Renal  04/06/2012  *RADIOLOGY REPORT*  Clinical Data: Rule out urinary tract obstruction.  Increasing creatinine and BUN.  RENAL/URINARY TRACT ULTRASOUND COMPLETE  Comparison:  No priors.  Findings:  Right Kidney:  No hydronephrosis.  Well-preserved cortex.  Normal size and parenchymal echotexture without focal abnormalities.  10.9 cm in length.  It  Left Kidney:  No hydronephrosis.  Well-preserved cortex. Poorly visualized secondary to body habitus, but there appears to be normal size and parenchymal echotexture without focal abnormalities.  10.6 cm in length.  Bladder:  Completely decompressed with Foley balloon catheter in place.  IMPRESSION: 1.  No evidence of hydronephrosis to suggest urinary tract obstruction.   Original Report Authenticated By: Florencia Reasons, M.D.    Dg Chest Portable 1 View  04/05/2012  *RADIOLOGY REPORT*  Clinical Data: History of trauma after falling with laceration to the left tibia and fibula.  PORTABLE CHEST - 1 VIEW  Comparison: Chest x-ray 04/12/2010.  Findings: Lung volumes are low.  Elevation of the right hemidiaphragm is unchanged.  Blunting of the lateral left costophrenic sulcus may reflect atelectasis and/or consolidation, and potentially a small left pleural effusion.  Probable subsegmental atelectasis in the right base.  Crowding of the pulmonary vasculature, likely accentuated by low lung volumes, without frank pulmonary edema.  Mild cardiomegaly is unchanged. The patient is rotated to the left on today's exam, resulting in distortion of the mediastinal contours and reduced diagnostic sensitivity and specificity for mediastinal pathology. Atherosclerosis in the thoracic aorta.  IMPRESSION: 1.  Atelectasis and/or consolidation, with potential superimposed small left pleural effusion at the base of the left hemithorax. 2.  Mild  cardiomegaly. 3.  Atherosclerosis. 4.  Chronic elevation of the right hemidiaphragm with right lower lobe subsegmental atelectasis.   Original Report Authenticated By: Florencia Reasons, M.D.     Family practice consult appreciated Dr Rush Farmer, will come to evaluate and admit the patient.    Joni Reining  Kalem Rockwell, PA-C 04/26/12 2000

## 2012-04-26 NOTE — ED Notes (Signed)
Per pt sts a few days of body jerking and unable to hold things or picks up things with her hands. sts also in her hips. Pt hyperglycemic

## 2012-04-26 NOTE — Telephone Encounter (Signed)
Daughter is calling because she was expecting to hear back from someone sooner about her moms Blood Sugars being so high (>300).  She is concerned because her mom is having muscle weakness and she is sure that she will be going back to the hospital if this doesn't get helped.

## 2012-04-27 ENCOUNTER — Encounter (HOSPITAL_COMMUNITY): Payer: Self-pay | Admitting: *Deleted

## 2012-04-27 ENCOUNTER — Inpatient Hospital Stay (HOSPITAL_COMMUNITY): Payer: Medicare HMO

## 2012-04-27 LAB — CBC
MCH: 28.9 pg (ref 26.0–34.0)
MCHC: 31.3 g/dL (ref 30.0–36.0)
MCV: 92.3 fL (ref 78.0–100.0)
Platelets: 200 10*3/uL (ref 150–400)
RDW: 15.7 % — ABNORMAL HIGH (ref 11.5–15.5)

## 2012-04-27 LAB — URINE CULTURE

## 2012-04-27 LAB — BASIC METABOLIC PANEL
BUN: 24 mg/dL — ABNORMAL HIGH (ref 6–23)
CO2: 34 mEq/L — ABNORMAL HIGH (ref 19–32)
Calcium: 8.4 mg/dL (ref 8.4–10.5)
Creatinine, Ser: 1.32 mg/dL — ABNORMAL HIGH (ref 0.50–1.10)
GFR calc non Af Amer: 38 mL/min — ABNORMAL LOW (ref >90)
Glucose, Bld: 105 mg/dL — ABNORMAL HIGH (ref 70–99)

## 2012-04-27 LAB — GLUCOSE, CAPILLARY
Glucose-Capillary: 150 mg/dL — ABNORMAL HIGH (ref 70–99)
Glucose-Capillary: 266 mg/dL — ABNORMAL HIGH (ref 70–99)
Glucose-Capillary: 293 mg/dL — ABNORMAL HIGH (ref 70–99)
Glucose-Capillary: 290 mg/dL — ABNORMAL HIGH (ref 70–99)

## 2012-04-27 LAB — RPR: RPR Ser Ql: NONREACTIVE

## 2012-04-27 MED ORDER — ROPINIROLE HCL 1 MG PO TABS
4.0000 mg | ORAL_TABLET | Freq: Every day | ORAL | Status: DC
  Administered 2012-04-27 – 2012-04-29 (×3): 4 mg via ORAL
  Filled 2012-04-27 (×6): qty 4

## 2012-04-27 NOTE — Progress Notes (Signed)
FMTS Daily Intern Progress Note  Subjective:  Sleepy this morning. Did not get CPAP overnight.  No pain, no shortness of breath or chets pain. No nausea or vomiting  I have reviewed the patient's medications.  Objective Temp:  [97.8 F (36.6 C)-98.8 F (37.1 C)] 97.8 F (36.6 C) (11/02 0619) Pulse Rate:  [58-69] 69  (11/02 0619) Resp:  [18-20] 18  (11/02 0619) BP: (115-151)/(53-78) 151/78 mmHg (11/02 0619) SpO2:  [93 %-100 %] 97 % (11/02 0619) Weight:  [225 lb (102.059 kg)] 225 lb (102.059 kg) (11/02 0016)  No intake or output data in the 24 hours ending 04/27/12 1012  CBG (last 3)   Basename 04/27/12 0733 04/27/12 0527 04/26/12 2242  GLUCAP 150* 111* 404*    General: sleepy and drowsy, arousable, no acute distress HEENT: PERRLA, moist mucous membranes CV: S1S2, RRR, 1/6 systolic murmur Pulm: clear to auscultation anteriorly Abd: soft, non tender, non distended Ext: no edema Skin: right pressure ulcer with mild erythema surrounding it, no significant warmth compared to left  Neuro: alert and oriented x4, CN2-12 grossly intact  Labs and Imaging  Lab 04/27/12 0530 04/26/12 1606 04/23/12 1526  WBC 7.5 7.9 7.5  HGB 10.1* 11.3* 11.3*  HCT 32.3* 36.4 35.8*  PLT 200 211 294     Lab 04/27/12 0530 04/26/12 1606 04/23/12 1526  NA 138 133* 133*  K 4.0 4.1 4.3  CL 101 91* 91*  CO2 34* 35* 36*  BUN 24* 30* 28*  CREATININE 1.32* 1.47* 1.50*  LABGLOM -- -- --  GLUCOSE 105* -- --  CALCIUM 8.4 9.3 9.8     Assessment and Plan  78 yo female with h/o HTN, DM2, OSA (not on cpap at home) who presents with generalized weakness and failure to thrive.   1. Weakness: likely multifactorial from chronic lower extremity weakness and deconditioning. Family and patient not willing to go to SNF at last hospitalization  - f/u blood and urine cultures.  - PT/OT eval for recommendation on placement. Had recommended Inpatient rehab which insurance doesn't cover and patient's daughter  had refused SNF.  - continue home lyrica and gabapentin  - monitor myoclonic movements  - continue lyrica and requip  2. Obstructive sleep apnea and likely obesity hypoventilation syndrome with elevated bicarb on BMP.  - CPAP overnight for sleep apnea, if patient can tolerate  - O2 as needed for O2<92%. Patient is on home O2 at night time.  3. Hyperglycemia: elevated CBG's on admit. On glipizide at home. CBG's improving on ISS.  - start lantus 5u and continue with ISS   4. heel pressure ulcer, no true evidence of cellulitis but in diabetic patient, will monitor closely. Afebrile with normal WBC.  - monitor for any signs of infection  - wound care  - heel elevation 5. H/o HTN: mild increase overnight - d/c fluids - monitor - continue home metoprolol 25mg  daily  6. CKD: creatinine at baseline: baseline 1.2-1.4  - monitor morning BMP  7. Fen/Gi: carb modified diet, kvo 8. PPx: heparin sub cu  9. Dispo: PT/OT eval for placement suggestions. Unclear what family is willing to do at this time.    Marena Chancy Pager: [phone] 04/27/2012, 10:12 AM

## 2012-04-27 NOTE — Progress Notes (Signed)
FMTS Attending Daily Note: [name] MD [phone] pager office [phone] I  have seen and examined this patient, reviewed their chart. I have discussed this patient with the resident. I agree with the resident's findings, assessment and care plan. Booties for heels.

## 2012-04-27 NOTE — Evaluation (Signed)
Physical Therapy Evaluation Patient Details Name: [name] [name] MRN: [id number] DOB: [birth year] Today's Date: 04/27/2012 Time: 1132-1206 PT Time Calculation (min): 34 min  PT Assessment / Plan / Recommendation Clinical Impression  Pt. was admitted with generalized weakness, FTT and frequent falls and had recent hospitalization for UTI, bacteremia, PNA.  Prior to this current  admission, she had myotonic jerks in hands and left hip which contributed to loss of balance.  She declined SNF for rehab last admission, and is now with a further decline in her functional mobility, gait and safety.  She is now agreeable to entertaining [facility] for rehab ("for 2 weeks").      PT Assessment  Patient needs continued PT services    Follow Up Recommendations  Post acute inpatient;Supervision/Assistance - 24 hour    Does the patient have the potential to tolerate intense rehabilitation   No, Recommend SNF  Barriers to Discharge Decreased caregiver support      Equipment Recommendations  None recommended by PT    Recommendations for Other Services OT consult   Frequency Min 3X/week    Precautions / Restrictions Precautions Precautions: Fall Restrictions Weight Bearing Restrictions: No   Pertinent Vitals/Pain No pain, no distress      Mobility  Bed Mobility Bed Mobility: Supine to Sit Supine to Sit: 4: Min guard;With rails;HOB elevated Sitting - Scoot to Edge of Bed: 6: Modified independent (Device/Increase time) Details for Bed Mobility Assistance: min guard assist and use of rail necessary Transfers Transfers: Sit to Stand;Stand to Sit Sit to Stand: 3: Mod assist;With upper extremity assist;From bed;From chair/3-in-1;With armrests;4: Min assist Stand to Sit: 4: Min assist;To chair/3-in-1;With armrests Details for Transfer Assistance: Pt. needing mod assist to rise to stand from bed (with no railing to push from) but min assist from 3n1 using armrests Ambulation/Gait Ambulation/Gait  Assistance: 4: Min assist Ambulation Distance (Feet): 10 Feet Assistive device: Rolling walker Ambulation/Gait Assistance Details: Pt. was quite flexed at trunk and needed multiple manual and verbal cues to correct, if only briefly.  RW safety cues as she tends to keep walker too far in advance of her. Gait Pattern: Step-through pattern;Shuffle;Trunk flexed General Gait Details: downward gaze with short steps    Shoulder Instructions     Exercises     PT Diagnosis: Difficulty walking;Abnormality of gait;Generalized weakness  PT Problem List: Decreased strength;Decreased activity tolerance;Decreased balance;Decreased mobility;Decreased knowledge of use of DME PT Treatment Interventions: DME instruction;Gait training;Functional mobility training;Therapeutic activities;Therapeutic exercise;Balance training;Patient/family education   PT Goals Acute Rehab PT Goals PT Goal Formulation: With patient Time For Goal Achievement: 05/11/12 Potential to Achieve Goals: Fair Pt will go Supine/Side to Sit: with modified independence PT Goal: Supine/Side to Sit - Progress: Goal set today Pt will go Sit to Supine/Side: with modified independence PT Goal: Sit to Supine/Side - Progress: Goal set today Pt will go Sit to Stand: with supervision PT Goal: Sit to Stand - Progress: Goal set today Pt will go Stand to Sit: with supervision PT Goal: Stand to Sit - Progress: Goal set today Pt will Transfer Bed to Chair/Chair to Bed: with supervision PT Transfer Goal: Bed to Chair/Chair to Bed - Progress: Goal set today Pt will Ambulate: 51 - 150 feet;with supervision;with rolling walker PT Goal: Ambulate - Progress: Goal set today  Visit Information  Last PT Received On: 04/27/12 Assistance Needed: +1 Reason Eval/Treat Not Completed: Patient not medically ready;Other (comment) (strict bedrest orders in same order set as PT)    Subjective Data  Subjective: "I probable should go for rehab" Patient Stated  Goal: I'd like to be able to walk without the walker   Prior Functioning  Home Living Lives With: Daughter Available Help at Discharge: Family;Available 24 hours/day Type of Home: House Home Access: Ramped entrance Home Layout: One level Bathroom Shower/Tub: Health visitor: Standard Bathroom Accessibility: Yes How Accessible: Accessible via walker Home Adaptive Equipment: Shower chair with back;Walker - rolling;Grab bars in shower;Wheelchair - Careers adviser (comment);Bedside commode/3-in-1 (hospital bed) Additional Comments: daughter Okey Regal with pt. 24/7 Prior Function Level of Independence: Needs assistance Needs Assistance: Bathing;Dressing;Toileting;Meal Prep;Light Housekeeping;Gait;Transfers Bath: Maximal Dressing: Maximal Toileting: Moderate Meal Prep: Total Light Housekeeping: Total Gait Assistance: max Transfer Assistance: max Able to Take Stairs?: No Driving: No Vocation: Retired Comments: Chart indicated pt's mobility declined to point where she could not get out of bed or ambulate just PTA Communication Communication: No difficulties Dominant Hand: Right    Cognition  Overall Cognitive Status: Appears within functional limits for tasks assessed/performed Arousal/Alertness: Awake/alert Orientation Level: Oriented X4 / Intact Behavior During Session: [facility] for tasks performed    Extremity/Trunk Assessment Right Upper Extremity Assessment RUE ROM/Strength/Tone: Deficits RUE ROM/Strength/Tone Deficits: Longstanding shoulder limitations of unknown origin.  Pt must raise her R UE with her L in order to apply deodorant, wash underneath, dress. RUE Sensation: WFL - Light Touch Left Upper Extremity Assessment LUE ROM/Strength/Tone: Deficits LUE ROM/Strength/Tone Deficits: deficits especially at shoulder, origin unknown LUE Sensation: WFL - Light Touch Right Lower Extremity Assessment RLE ROM/Strength/Tone: Deficits RLE ROM/Strength/Tone Deficits:  generally weak, grossly 4+/5 RLE Sensation: History of peripheral neuropathy Left Lower Extremity Assessment LLE ROM/Strength/Tone: Deficits LLE ROM/Strength/Tone Deficits: grossly 4-4+/5 (slightly weaker than right) LLE Sensation: History of peripheral neuropathy   Balance    End of Session PT - End of Session Equipment Utilized During Treatment: Gait belt Activity Tolerance: Patient limited by fatigue Patient left: in chair;with call bell/phone within reach Nurse Communication: Mobility status  GP     Ferman Hamming 04/27/2012, 12:32 PM Weldon Picking PT Acute Rehab Services [phone] Beeper [phone]

## 2012-04-27 NOTE — Progress Notes (Signed)
Occupational Therapy Evaluation Patient Details Name: [name] [name] MRN: [id number] DOB: [birth year] Today's Date: 04/27/2012 Time: 7829-5621 OT Time Calculation (min): 19 min  OT Assessment / Plan / Recommendation Clinical Impression  78 yo with recent hospital admission. PT living at home with her daughter. Pt has experienced a decline in her self care and mobility and will benefit fromskilled OT to address those deficits. Pt will benefit from SNF priorto return home to live with family @ 24/7 A.     OT Assessment  Patient needs continued OT Services    Follow Up Recommendations  Skilled nursing facility    Barriers to Discharge Decreased caregiver support    Equipment Recommendations  None recommended by OT    Recommendations for Other Services    Frequency  Min 2X/week    Precautions / Restrictions Precautions Precautions: Fall Precaution Comments: monitor O2 sats will likely need home O2 Restrictions Weight Bearing Restrictions: No   Pertinent Vitals/Pain Generalized joint pain   ADL  Grooming: Minimal assistance Where Assessed - Grooming: Supported sitting Upper Body Bathing: Moderate assistance Where Assessed - Upper Body Bathing: Unsupported sitting Lower Body Bathing: Maximal assistance Where Assessed - Lower Body Bathing: Supported sit to stand Upper Body Dressing: Moderate assistance Where Assessed - Upper Body Dressing: Unsupported sitting Lower Body Dressing: Simulated;Maximal assistance Where Assessed - Lower Body Dressing: Supported sit to stand Toilet Transfer: Moderate assistance Toilet Transfer Method: Sit to stand Toilet Transfer Equipment: Bedside commode Toileting - Clothing Manipulation and Hygiene: Simulated;Maximal assistance Where Assessed - Engineer, mining and Hygiene: Standing;Other (comment) Equipment Used: Gait belt;Rolling walker ADL Comments: Family has had to provide greater amount of assist with ADL    OT Diagnosis:  Generalized weakness  OT Problem List: Decreased strength;Decreased range of motion;Decreased activity tolerance;Impaired balance (sitting and/or standing);Impaired vision/perception;Decreased safety awareness;Decreased knowledge of use of DME or AE;Decreased knowledge of precautions;Cardiopulmonary status limiting activity;Obesity;Impaired UE functional use;Increased edema;Pain OT Treatment Interventions: Self-care/ADL training;Therapeutic exercise;Neuromuscular education;Energy conservation;DME and/or AE instruction;Therapeutic activities;Cognitive remediation/compensation;Patient/family education;Balance training   OT Goals Acute Rehab OT Goals OT Goal Formulation: With patient ADL Goals Pt Will Perform Grooming: with supervision;Standing at sink ADL Goal: Grooming - Progress: Goal set today Pt Will Perform Upper Body Bathing: with min assist;Sitting, edge of bed ADL Goal: Upper Body Bathing - Progress: Goal set today Pt Will Perform Lower Body Bathing: with min assist;Sit to stand from bed;with adaptive equipment ADL Goal: Lower Body Bathing - Progress: Goal set today Pt Will Perform Upper Body Dressing: with min assist;Sitting, bed ADL Goal: Upper Body Dressing - Progress: Goal set today Pt Will Perform Lower Body Dressing: with min assist;Sit to stand from chair;Unsupported ADL Goal: Lower Body Dressing - Progress: Goal set today Pt Will Transfer to Toilet: with min assist;with DME ADL Goal: Toilet Transfer - Progress: Goal set today Pt Will Perform Toileting - Clothing Manipulation: with supervision;Standing ADL Goal: Toileting - Clothing Manipulation - Progress: Goal set today Pt Will Perform Toileting - Hygiene: with supervision;Sit to stand from 3-in-1/toilet ADL Goal: Toileting - Hygiene - Progress: Goal set today Pt Will Perform Tub/Shower Transfer: Shower transfer;Ambulation;with supervision;Shower seat with back ADL Goal: Web designer - Progress: Goal set  today Miscellaneous OT Goals Miscellaneous OT Goal #1: Pt will demonstrat 2 E conservation strategies with min vc during ADL task. OT Goal: Miscellaneous Goal #1 - Progress: Goal set today  Visit Information  Last OT Received On: 04/27/12 Assistance Needed: +1    Subjective Data  Prior Functioning     Home Living Lives With: Daughter Available Help at Discharge: Family;Available 24 hours/day Type of Home: House Home Access: Ramped entrance Home Layout: One level Bathroom Shower/Tub: Health visitor: Standard Bathroom Accessibility: Yes How Accessible: Accessible via walker Home Adaptive Equipment: Shower chair with back;Walker - rolling;Grab bars in shower;Wheelchair - Careers adviser (comment);Bedside commode/3-in-1 Additional Comments: daughter Okey Regal with pt. 24/7 Prior Function Level of Independence: Needs assistance Needs Assistance: Bathing;Dressing;Toileting;Meal Prep;Light Housekeeping;Gait;Transfers Bath: Moderate Dressing: Moderate Toileting: Moderate Meal Prep: Total Light Housekeeping: Total Gait Assistance: max Transfer Assistance: max Able to Take Stairs?: No Driving: No Vocation: Retired Comments: Chart indicated pt's mobility declined to point where she could not get out of bed or ambulate just PTA Communication Communication: No difficulties Dominant Hand: Right         Vision/Perception     Cognition  Overall Cognitive Status: Appears within functional limits for tasks assessed/performed Arousal/Alertness: Awake/alert Orientation Level: Oriented X4 / Intact Behavior During Session: [facility] for tasks performed    Extremity/Trunk Assessment Right Upper Extremity Assessment RUE ROM/Strength/Tone: Deficits RUE ROM/Strength/Tone Deficits: Longstanding shoulder limitations of unknown origin.  Pt must raise her R UE with her L in order to apply deodorant, wash underneath, dress. RUE Sensation: WFL - Light Touch RUE Coordination: WFL  - fine motor Left Upper Extremity Assessment LUE ROM/Strength/Tone: Deficits LUE ROM/Strength/Tone Deficits: deficits especially at shoulder, origin unknown LUE Sensation: WFL - Light Touch LUE Coordination: WFL - fine motor Right Lower Extremity Assessment RLE ROM/Strength/Tone: Deficits RLE ROM/Strength/Tone Deficits: generally weak, grossly 4+/5 RLE Sensation: History of peripheral neuropathy Left Lower Extremity Assessment LLE ROM/Strength/Tone: Deficits LLE ROM/Strength/Tone Deficits: grossly 4-4+/5 (slightly weaker than right) LLE Sensation: History of peripheral neuropathy     Mobility Bed Mobility Bed Mobility: Supine to Sit;Scooting to HOB Supine to Sit: 5: Supervision;With rails;HOB elevated Sitting - Scoot to Edge of Bed: 5: Supervision Scooting to [facility]: 4: Min assist;With rail Details for Bed Mobility Assistance: pt has hospital bed at home Transfers Transfers: Sit to Stand;Stand to Sit Sit to Stand: 3: Mod assist;With upper extremity assist;From bed;From chair/3-in-1;With armrests;4: Min assist Stand to Sit: 4: Min assist;With armrests;To bed Details for Transfer Assistance: PT very fatigued with mobility     Shoulder Instructions     Exercise     Balance     End of Session OT - End of Session Equipment Utilized During Treatment: Gait belt Activity Tolerance: Patient limited by fatigue Patient left: in bed;with call bell/phone within reach Nurse Communication: Mobility status  GO     Shaely Gadberry,HILLARY 04/27/2012, 3:42 PM [facility], OTR/L  [phone] 04/27/2012

## 2012-04-27 NOTE — H&P (Signed)
FMTS Attending Admission Note: Britanie Harshman MD [phone] pager office [phone] I  have seen and examined this patient, reviewed their chart. I have discussed this patient with the resident. I agree with the resident's findings, assessment and care plan. 

## 2012-04-27 NOTE — Progress Notes (Signed)
PT Cancellation Note  Patient Details Name: [name] [name] MRN: [id number] DOB: July 08, 1933   Cancelled Treatment:    Reason Eval/Treat Not Completed: Patient not medically ready;Other (comment) (strict bedrest orders in same order set as PT).  Need MD to advance activity order when appropriate so PT can proceed with eval.   Ferman Hamming 04/27/2012, 11:13 AM Weldon Picking PT Acute Rehab Services [phone] Beeper [phone]

## 2012-04-28 LAB — GLUCOSE, CAPILLARY
Glucose-Capillary: 257 mg/dL — ABNORMAL HIGH (ref 70–99)
Glucose-Capillary: 263 mg/dL — ABNORMAL HIGH (ref 70–99)
Glucose-Capillary: 289 mg/dL — ABNORMAL HIGH (ref 70–99)

## 2012-04-28 LAB — BASIC METABOLIC PANEL
CO2: 29 mEq/L (ref 19–32)
Calcium: 8.5 mg/dL (ref 8.4–10.5)
Chloride: 100 mEq/L (ref 96–112)
Potassium: 4.3 mEq/L (ref 3.5–5.1)
Sodium: 136 mEq/L (ref 135–145)

## 2012-04-28 LAB — CBC
MCH: 29.1 pg (ref 26.0–34.0)
Platelets: 185 10*3/uL (ref 150–400)
RBC: 3.5 MIL/uL — ABNORMAL LOW (ref 3.87–5.11)
WBC: 7.2 10*3/uL (ref 4.0–10.5)

## 2012-04-28 MED ORDER — INSULIN GLARGINE 100 UNIT/ML ~~LOC~~ SOLN
5.0000 [IU] | Freq: Every day | SUBCUTANEOUS | Status: DC
  Administered 2012-04-28: 5 [IU] via SUBCUTANEOUS

## 2012-04-28 MED ORDER — ALLOPURINOL 100 MG PO TABS
100.0000 mg | ORAL_TABLET | Freq: Every day | ORAL | Status: DC
Start: 2012-04-28 — End: 2012-04-30
  Administered 2012-04-28 – 2012-04-30 (×3): 100 mg via ORAL
  Filled 2012-04-28 (×3): qty 1

## 2012-04-28 NOTE — Progress Notes (Signed)
Pt has refused use of cpap at this time. Says she doesn't feel she needs it but if she later feels she is sob, she will have her nurse call rt.

## 2012-04-28 NOTE — Progress Notes (Signed)
FMTS Attending Daily Note: Moet Mikulski MD [phone] pager office [phone] I  have seen and examined this patient, reviewed their chart. I have discussed this patient with the resident. I agree with the resident's findings, assessment and care plan. 

## 2012-04-28 NOTE — Progress Notes (Signed)
FMTS Daily Intern Progress Note  Subjective:  Seen at bedside this morning. No new complaints but states she "can't lift her arms much" and her shoulder hurts if she tries to reach too far. Curious about MRI. States she would consider short-term SNF to help get her strength up. Denies N/V, fever, abd pain, chest pain, SOB.  Objective Temp:  [97.2 F (36.2 C)-98.2 F (36.8 C)] 97.2 F (36.2 C) (11/03 0425) Pulse Rate:  [52-62] 57  (11/03 0900) Resp:  [18] 18  (11/03 0425) BP: (126-146)/(48-72) 126/48 mmHg (11/03 0900) SpO2:  [95 %-100 %] 100 % (11/03 0425)   Intake/Output Summary (Last 24 hours) at 04/28/12 1122 Last data filed at 04/27/12 2141  Gross per 24 hour  Intake      3 ml  Output      0 ml  Net      3 ml   CBG (last 3)   Basename 04/28/12 0735 04/28/12 0420 04/28/12 0008  GLUCAP 176* 190* 138*   General: adult female, appears sleepy but appropriately responsive, in no acute distress CV: S1S2, RRR, murmur not appreciated Pulm: clear to auscultation anteriorly Abd: soft, non tender, non distended Ext: no edema, moves all extremities spontaneously, good grip strength Skin: right heel pressure ulcer with mild erythema surrounding it, no significant warmth compared to left   Labs and Imaging  Lab 04/28/12 0530 04/27/12 0530 04/26/12 1606  WBC 7.2 7.5 7.9  HGB 10.2* 10.1* 11.3*  HCT 32.2* 32.3* 36.4  PLT 185 200 211    Lab 04/28/12 0530 04/27/12 0530 04/26/12 1606  NA 136 138 133*  K 4.3 4.0 4.1  CL 100 101 91*  CO2 29 34* 35*  BUN 23 24* 30*  CREATININE 1.28* 1.32* 1.47*  LABGLOM -- -- --  GLUCOSE 194* -- --  CALCIUM 8.5 8.4 9.3   CT, 11/1 @1626  IMPRESSION:  No evidence of traumatic injury to the cervical spine.  Extensive multilevel degenerative changes, most severe at C5-6.  MRI, 11/2 @1709  IMPRESSION:  Multilevel spondylosis as described. Central protrusions resulting  in spinal stenosis most notable at C3-C4. Non compressive synovial  cyst C1-2,  right. No visible cervical spine fracture or  intraspinal hematoma. No abnormal cord signal.  Assessment and Plan  78 yo female with h/o HTN, DM2, OSA (not on cpap at home) who presents with generalized weakness and failure to thrive.   1. Weakness: likely multifactorial from chronic lower extremity weakness and deconditioning. Family and patient not willing to go to SNF at last hospitalization and inpt rehab not covered by pt insurance. MRI shows cervical spinal stenosis/spondylosis but no abnormal cord signal. - Blood cultures show NGTD, urine cx with mixed growth, no predominance - monitor myoclonic movements; no complaints 11/3, not witnessed on exam - continue Lyrica and Requip - Pt appears agreeable to at least short-term SNF, will need to clarify/talk with daughter  2. Obstructive sleep apnea and likely obesity hypoventilation syndrome with elevated bicarb on BMP.  - CPAP overnight for sleep apnea, if patient can tolerate  - O2 as needed for O2<92%. Patient is on home O2 at night time.   3. Hyperglycemia: elevated CBG's on admit. On glipizide at home. CBG's improving on ISS.  - Lantus 5 units daily, SSI; will hold glipizide to hopefully avoid hypoglycemia   4. heel pressure ulcer, no current evidence of cellulitis but in diabetic patient, will monitor closely.  - Remains Afebrile with normal WBC; will monitor for any signs of infection  -  Local wound care, heel elevation, heel protectors  5. H/o HTN: some intermittent elevations - monitor, continue home metoprolol 25mg  daily  6. CKD: creatinine at baseline: baseline 1.2-1.4  -Cr around baseline, 1.28 on 11/3, will monitor  7. Fen/Gi: carb modified diet, kvo 8. PPx: heparin sub cu  9. Dispo: PT/OT recommending SNF, pt appears agreeable at this time. Will consult SW.   [name] Pager: [phone] 04/28/2012, 11:22 AM

## 2012-04-28 NOTE — Consult Note (Signed)
WOC consult Note Reason for Consult: Pressure Ulcer Right heel Wound type:Pressure Ulcer, Stage II  (Intact, serum filled Blister) Pressure Ulcer POA: Yes Measurement:2cm x 2.5cm  Wound bed:not seen-blister intact Drainage (amount, consistency, odor) none Periwound:intact Dressing procedure/placement/frequency:I will suggest placement of a soft silicone foam dressing and have ordered two (bilateral) heel pressure redistribution devices (boots) for her feet. I will not follow.  Please re-consult if needed. Thanks, Ladona Mow, MSN, RN, [facility], CWOCN [phone])

## 2012-04-28 NOTE — ED Provider Notes (Signed)
Medical screening examination/treatment/procedure(s) were conducted as a shared visit with non-physician practitioner(s) and myself.  I personally evaluated the patient during the encounter  Brett Darko M Ryleigh Esqueda, MD 04/28/12 1635 

## 2012-04-28 NOTE — Plan of Care (Signed)
Problem: Phase II Progression Outcomes Goal: Progress activity as tolerated unless otherwise ordered Outcome: Progressing OOB to [facility]

## 2012-04-29 ENCOUNTER — Telehealth: Payer: Self-pay | Admitting: Family Medicine

## 2012-04-29 LAB — GLUCOSE, CAPILLARY
Glucose-Capillary: 278 mg/dL — ABNORMAL HIGH (ref 70–99)
Glucose-Capillary: 254 mg/dL — ABNORMAL HIGH (ref 70–99)

## 2012-04-29 MED ORDER — INSULIN GLARGINE 100 UNIT/ML ~~LOC~~ SOLN
10.0000 [IU] | Freq: Every day | SUBCUTANEOUS | Status: DC
  Administered 2012-04-29: 10 [IU] via SUBCUTANEOUS

## 2012-04-29 MED ORDER — FUROSEMIDE 40 MG PO TABS
40.0000 mg | ORAL_TABLET | Freq: Every day | ORAL | Status: DC
  Administered 2012-04-29 – 2012-04-30 (×2): 40 mg via ORAL
  Filled 2012-04-29 (×2): qty 1

## 2012-04-29 MED ORDER — VITAMIN D (ERGOCALCIFEROL) 1.25 MG (50000 UNIT) PO CAPS
50000.0000 [IU] | ORAL_CAPSULE | ORAL | Status: DC
  Administered 2012-04-29: 50000 [IU] via ORAL
  Filled 2012-04-29: qty 1

## 2012-04-29 NOTE — Discharge Summary (Signed)
Family Medicine Teaching [facility] Discharge Summary  Patient name: [name] [name] Medical record number: [id number] Date of birth: 1934-01-15 Age: 78 y.o. 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 CKD stage 3 Vitamin D deficiency Morbid obesity (BMI 43.9)  Consultations: none  Significant Labs and Imaging:   Lab 04/30/12 0545 04/28/12 0530 04/27/12 0530  WBC 6.9 7.2 7.5  HGB 10.0* 10.2* 10.1*  HCT 31.7* 32.2* 32.3*  PLT 157 185 200    Lab 04/30/12 0545 04/28/12 0530 04/27/12 0530 04/26/12 1606 04/23/12 1526  NA 135 136 138 -- --  K 4.3 4.3 4.0 -- --  CL 97 100 101 -- --  CO2 33* 29 34* -- --  BUN 22 23 24* -- --  CREATININE 1.31* 1.28* 1.32* -- --  CALCIUM 9.4 8.5 8.4 -- --  PROT -- -- -- 6.8 6.2  BILITOT -- -- -- 0.3 0.4  ALKPHOS -- -- -- 101 94  ALT -- -- -- 23 29  AST -- -- -- 30 34  GLUCOSE 182* 194* 105* -- --     CT, 11/1 @1626   IMPRESSION:  No evidence of traumatic injury to the cervical spine.  Extensive multilevel degenerative changes, most severe at C5-6.   MRI, 11/2 @1709   IMPRESSION:  Multilevel spondylosis as described. Central protrusions resulting  in spinal stenosis most notable at C3-C4. Non compressive synovial  cyst C1-2, right. No visible cervical spine fracture or  intraspinal hematoma. No abnormal cord signal.  Procedures: none  Brief Hospital Course: 78 yo female with h/o HTN, DM2, OSA (not on cpap at home) who presents with generalized weakness and failure to thrive. Recent admission for pyelonephritis; at that time, pt/family refused SNF despite recommendations.  Family/pt now amenable for short-term SNF for conditioning with plan to return home afterwards. Currently stable for discharge. Please see details below by problem list and issues for follow-up/discharge instructions.  1. Weakness: likely multifactorial from chronic lower extremity weakness and deconditioning. Family and patient not willing to go to SNF at last hospitalization and inpt rehab not covered by pt insurance. MRI shows cervical spinal stenosis/spondylosis but no abnormal cord signal.  - some concern from Dr. Sheffield Slider (PCP) for polymyalgia rheumatica, but ESR was only 45 (age-corrected expected = 43 [(age+10)/2]) - Blood cultures show NGTD, urine cx with mixed growth/no predominance (not likely UTI) - monitored for myoclonic movements described prior to admission  -no complaints 11/3-4, no jerking movements witnessed on exam either day   -possible component of muscle weakness when trying to hold a posture/etc, from deconditioning - continued Lyrica and Requip   2. Obstructive sleep apnea and likely obesity hypoventilation syndrome with elevated bicarb on BMP.  - CPAP overnight for sleep apnea, if patient can tolerate; pt refusing for now but will request if she feels SOB  - O2 as needed for O2<92%. Patient is on home O2 at night, generally 2L by [place].   3. Hyperglycemia/DM type 2: elevated CBG's on admit. On glipizide at home; metformin stopped during previous admission due to acute-on-chronic renal impairment. CBG's still with some intermittent highs, 11/3-4 - Managed with Lantus (initially 5 units, daily) and SSI, with glipizide held. Increased to Lantus 10 units daily  on 11/4, then to 15 units daily on 11/5. - Will discharge on Lantus 20 units daily, with CBG's qAC+HS; SNF physican/practicioner, please titrate as needed, long-acting vs addition of short-acting insulin.  4. Heel pressure ulcer - no current evidence of cellulitis but in diabetic patient, was monitored closely - Has remained  afebrile with normal WBC  - Managed with local wound care, heel elevation/protectors   5. H/o HTN: some intermittent elevations  - monitored throughout admission, continued home metoprolol 25mg  daily  - may need further medication adjustments  6. CKD: creatinine at baseline: baseline 1.2-1.4  -Cr around baseline, 1.28 on 11/3, 1.3  -Restarted home Lasix 11/4  7. Vitamin D deficiency - Noted last outpt visit, to start vitamin D 16109 units weekly. Started Monday 11/4.  Discharge Medications:    Medication List     As of 04/30/2012  3:01 PM    STOP taking these medications         FLONASE 50 MCG/ACT nasal spray   Generic drug: fluticasone      glipiZIDE 5 MG tablet   Commonly known as: GLUCOTROL      TAKE these medications         albuterol 108 (90 BASE) MCG/ACT inhaler   Commonly known as: PROVENTIL HFA;VENTOLIN HFA   Inhale 2 puffs into the lungs every 6 (six) hours as needed. Shortness of breath      allopurinol 100 MG tablet   Commonly known as: ZYLOPRIM   Take 100 mg by mouth daily.      BAYER ASPIRIN 325 MG tablet   Generic drug: aspirin   Take 325 mg by mouth daily.      citalopram 20 MG tablet   Commonly known as: CELEXA   Take 20 mg by mouth daily.      furosemide 40 MG tablet   Commonly known as: LASIX   Take 40 mg by mouth daily.      insulin glargine 100 UNIT/ML injection   Commonly known as: LANTUS   Inject 20 Units into the skin daily.      metoprolol succinate 25 MG 24 hr tablet   Commonly known as: TOPROL-XL   Take 25 mg by mouth daily.      multivitamin with minerals Tabs   Take 1 tablet by mouth daily.      pregabalin 150 MG capsule   Commonly known as: LYRICA   Take 150 mg by mouth at bedtime.      rOPINIRole 4 MG tablet   Commonly known as: REQUIP   Take 4 mg by mouth at bedtime.      simvastatin 20 MG tablet   Commonly known as: ZOCOR   Take 20 mg by mouth at bedtime.      solifenacin 5 MG tablet   Commonly known as: VESICARE    Take 5 mg by mouth at bedtime.      traMADol-acetaminophen 37.5-325 MG per tablet   Commonly known as: ULTRACET   Take 2 tablets by mouth every 8 (eight) hours as needed. For pain      Vitamin D (Ergocalciferol) 50000 UNITS Caps   Commonly known as: DRISDOL   Take 1 capsule (50,000 Units total) by mouth every 7 (seven) days. First dose given 04/29/2012.        Issues for Follow Up:  1. Generalized weakness/heel ulcer - Likely multifactorial with component of deconditioning. Please address continued PT/OT conditioning therapy and any acute/chronic pain. Please also monitor for current heel  ulcer worsening and take steps to ensure skin integrity.  2. DM - Managed with Lantus and Novolog SSI while in the hospital previously had been on glipizide 10 mg qAM and 5 mg qPM, which was held. Also reports having been on metformin in the past (d/ced during previous admission for acute-on-chronic renal impairment). Pt is to be discharged on Lantus 20 units daily, with CBG's qAC+HS; SNF physican/practicioner, please titrate as needed, long-acting vs addition of short-acting insulin.  3. CKD - Pt with recent hospitalization for pyelonephritis (prior to this hospitalization) and acute-on-chronic kidney disease. AKI has resolved by this admission, last Cr 1.32. Monitor kidney function and adjust medications as appropriate.   4. HTN - Pt has generally controlled BP's with occasional intermittent elevations to systolics of 150's. On metoprolol, please adjust and/or add new agents as necessary.  5. O2 requirement/OSA - Pt has a history of OSA and uses O2 at night at home but refused CPAP in the hospital; apparently has had CPAP at home in the past but does not currently, and does not like the mask.  Outstanding Results: none  Discharge Instructions:  Pt intermittently requires O2, mostly at night (has consistently refused CPAP despite hx of OSA; would continue to offer). Pt would benefit from continued  PT/OT for re-conditioning. Pt would also likely benefit from SLP evaluation, though there has been no evidence of aspiration/increased risk during this hospitalization.   Discharge Condition: stable  Street, Bibo, MD 04/30/2012, 3:01 PM

## 2012-04-29 NOTE — Progress Notes (Signed)
Pt has refused cpap/bipap at this time

## 2012-04-29 NOTE — Progress Notes (Signed)
Clinical Social Work Department BRIEF PSYCHOSOCIAL ASSESSMENT 04/29/2012  Patient:  [name] [name], [name] [name]     Account Number:  [id number]     Admit date:  04/26/2012  Clinical Social Worker:  Dennison Bulla  Date/Time:  04/29/2012 11:00 AM  Referred by:  Physician  Date Referred:  04/29/2012 Referred for  SNF Placement   Other Referral:   Interview type:  Patient Other interview type:    PSYCHOSOCIAL DATA Living Status:  ALONE Admitted from facility:   Level of care:   Primary support name:  Harriett Sine Primary support relationship to patient:  CHILD, ADULT Degree of support available:   Strong    CURRENT CONCERNS Current Concerns  Post-Acute Placement   Other Concerns:    SOCIAL WORK ASSESSMENT / PLAN CSW received referral from MD reporting that patient was agreeable to ST SNF placement at dc. CSW reviewed chart and met with patient and dtr Harriett Sine) at bedside. Patient agreeable to family involvement and called dtr Okey Regal) to participate in assessment as well.    CSW introduced myself and explained role. Patient reports she has been weak and falling at home and therefore is agreeable to SNF. Patient went to [facility] about 2-3 years ago and is familiar with SNF placement. CSW provided patient with SNF list and explained process. Patient reports she spoke with MD regarding placement at [facility] but has not decided if she will go there or not. Patient agreeable to [facility] search.    CSW completed FL2 and faxed out to [facility]. CSW will follow up with bed offers.   Assessment/plan status:  Psychosocial Support/Ongoing Assessment of Needs Other assessment/ plan:   Information/referral to community resources:   SNF list    PATIENT'S/FAMILY'S RESPONSE TO PLAN OF CARE: Patient alert and oriented. Patient engaged in assessment and agreeable for dtrs to assist with decision making. Patient only wants ST stay. Patient agreeable to CSW follow up and has CSW contact information.

## 2012-04-29 NOTE — Progress Notes (Addendum)
Clinical Social Work Department CLINICAL SOCIAL WORK PLACEMENT NOTE 04/29/2012  Patient:  [name] [name], [name] [name]  Account Number:  [id number] Admit date:  04/26/2012  Clinical Social Worker:  Unk Lightning, LCSW  Date/time:  04/29/2012 11:00 AM  Clinical Social Work is seeking post-discharge placement for this patient at the following level of care:   SKILLED NURSING   (*CSW will update this form in Epic as items are completed)   04/29/2012  Patient/family provided with [facility] Department of Clinical Social Work's list of facilities offering this level of care within the geographic area requested by the patient (or if unable, by the patient's family).  04/29/2012  Patient/family informed of their freedom to choose among providers that offer the needed level of care, that participate in Medicare, Medicaid or managed care program needed by the patient, have an available bed and are willing to accept the patient.  04/29/2012  Patient/family informed of [facility]' ownership interest in [facility], as well as of the fact that they are under no obligation to receive care at this facility.  PASARR submitted to EDS on existing # PASARR number received from EDS on   FL2 transmitted to all facilities in geographic area requested by pt/family on  04/29/2012 FL2 transmitted to all facilities within larger geographic area on   Patient informed that his/her managed care company has contracts with or will negotiate with  certain facilities, including the following:     Patient/family informed of bed offers received:  04/29/12 Patient chooses bed at [facility] Physician recommends and patient chooses bed at    Patient to be transferred to [facility] on  04/30/12 Patient to be transferred to facility by [facility]  The following physician request were entered in Epic:   Additional Comments:

## 2012-04-29 NOTE — Progress Notes (Signed)
Pts blood sugar 401. MD on call notified. 15 Units of novolog given per MD telephone order

## 2012-04-29 NOTE — Progress Notes (Signed)
FMTS Daily Intern Progress Note  Subjective:  Seen at bedside this morning. Initially sleeping but easily arousable; feels tired but has had no more jerking. Still feels very weak. Still agreeable to short-term SNF for conditioning. Denies N/V, fever, abd pain, chest pain, SOB.  Objective Temp:  [98.1 F (36.7 C)-98.5 F (36.9 C)] 98.5 F (36.9 C) (11/04 0417) Pulse Rate:  [59-67] 66  (11/04 0417) Resp:  [18] 18  (11/04 0417) BP: (138-151)/(71-79) 142/71 mmHg (11/04 0417) SpO2:  [97 %-98 %] 97 % (11/04 0417)   Intake/Output Summary (Last 24 hours) at 04/29/12 0915 Last data filed at 04/29/12 0743  Gross per 24 hour  Intake      0 ml  Output    550 ml  Net   -550 ml   CBG (last 3)   Basename 04/29/12 0742 04/29/12 0412 04/29/12 0002  GLUCAP 254* 278* 289*   General: adult female, appears sleepy and yawns frequently but appropriately responsive; NAD CV: S1S2, RRR, no murmur appreciated Pulm: clear to auscultation with normal work of breathing Abd: soft, non tender, BS+ Ext: no edema, moves all extremities spontaneously, good grip strength; unable to raise arms completely over her head but lifts legs from bed against resistance, no intention tremor of upper extremities Skin: bilateral feet with heel protectors in place  Labs and Imaging  Lab 04/28/12 0530 04/27/12 0530 04/26/12 1606  WBC 7.2 7.5 7.9  HGB 10.2* 10.1* 11.3*  HCT 32.2* 32.3* 36.4  PLT 185 200 211    Lab 04/28/12 0530 04/27/12 0530 04/26/12 1606  NA 136 138 133*  K 4.3 4.0 4.1  CL 100 101 91*  CO2 29 34* 35*  BUN 23 24* 30*  CREATININE 1.28* 1.32* 1.47*  LABGLOM -- -- --  GLUCOSE 194* -- --  CALCIUM 8.5 8.4 9.3   CT, 11/1 @1626  IMPRESSION:  No evidence of traumatic injury to the cervical spine.  Extensive multilevel degenerative changes, most severe at C5-6.  MRI, 11/2 @1709  IMPRESSION:  Multilevel spondylosis as described. Central protrusions resulting  in spinal stenosis most notable at  C3-C4. Non compressive synovial  cyst C1-2, right. No visible cervical spine fracture or  intraspinal hematoma. No abnormal cord signal.  Assessment and Plan  78 yo female with h/o HTN, DM2, OSA (not on cpap at home) who presents with generalized weakness and failure to thrive.   1. Weakness: likely multifactorial from chronic lower extremity weakness and deconditioning. Family and patient not willing to go to SNF at last hospitalization and inpt rehab not covered by pt insurance. MRI shows cervical spinal stenosis/spondylosis but no abnormal cord signal. - Blood cultures show NGTD, urine cx with mixed growth/no predominance - monitor myoclonic movements; no complaints 11/3-4, no jerking movements witnessed on exam either day - continue Lyrica and Requip - Pt agreeable to at least short-term SNF, will need to clarify/talk with daughter  2. Obstructive sleep apnea and likely obesity hypoventilation syndrome with elevated bicarb on BMP.  - CPAP overnight for sleep apnea, if patient can tolerate; pt refusing for now but will request if she feels SOB  - O2 as needed for O2<92%. Patient is on home O2 at night   3. Hyperglycemia/DM type 2: elevated CBG's on admit. On glipizide at home. CBG's improved but still high - will increase to Lantus 10 units daily, SSI; will hold glipizide   4. heel pressure ulcer, no current evidence of cellulitis but in diabetic patient, will monitor closely.  - Remains Afebrile  with normal WBC; will monitor for any signs of infection  - Local wound care, heel elevation/protectors  5. H/o HTN: some intermittent elevations - monitor, continue home metoprolol 25mg  daily  6. CKD: creatinine at baseline: baseline 1.2-1.4  -Cr around baseline, 1.28 on 11/3, will monitor -Restart home Lasix  7. Vitamin D deficiency - Noted last outpt visit, to start vitamin D 16109 units weekly. Will order here.  8. Fen/Gi: carb modified diet, kvo 9. PPx: heparin sub cu  10. Dispo:  PT/OT recommending SNF, pt appears agreeable at this time. Will consult SW.   [name] Pager: [phone] 04/29/2012, 9:15 AM

## 2012-04-29 NOTE — Progress Notes (Signed)
Clinical Social Work  CSW met with patient at bedside. No visitors present but patient speaking with dtr [name]) on the phone. CSW provided bed offers and patient reports she will discuss with other dtrs. CSW received permission to call dtr Okey Regal) and explain bed offers as well. CSW spoke with dtr who requested to expand search to [facility]. CSW faxed out and will continue to follow.  [place], [place] [phone]

## 2012-04-29 NOTE — Progress Notes (Signed)
I examined this patient and discussed the care plan with Street and the [facility] team and agree with assessment and plan as documented in the progress note above. I would like to get a sed rate while here to consider polymyalgia rheumatica. I'd prefer that she go to [facility] for her rehab if possible because we could continue to follow her case there.

## 2012-04-29 NOTE — Telephone Encounter (Signed)
I spoke with her daughter, [name], and recommended that her mother go to [facility] for rehab if that's OK with them, which it is.  She'd been to [facility] before after her knee replacement. I reviewed the lab tests that we had done in the hospital thus far.

## 2012-04-29 NOTE — Telephone Encounter (Signed)
Ms. Val Eagle is returning call to Dr. Sheffield Slider about placing the patient in a Nursing Home for a few weeks.  She said that she would be at this number for a bit but then she will be heading out to see Ms. Frankie.

## 2012-04-29 NOTE — Progress Notes (Signed)
Physical Therapy Treatment Patient Details Name: [name] [name] MRN: [id number] DOB: Jul 31, 1933 Today's Date: 04/29/2012 Time: 1308-6578 PT Time Calculation (min): 28 min  PT Assessment / Plan / Recommendation Comments on Treatment Session  pt presents with FTT, Falls, R heel blister, and recent hospital admit for PNA.  pt generally weak and deconditioned.  Needs encouragement for OOB and fatigues quickly with standing, so deferred ambulating at this time.  Will continue to follow.      Follow Up Recommendations  Post acute inpatient     Does the patient have the potential to tolerate intense rehabilitation  No, Recommend SNF  Barriers to Discharge        Equipment Recommendations  None recommended by PT;None recommended by OT    Recommendations for Other Services    Frequency Min 3X/week   Plan Discharge plan remains appropriate;Frequency remains appropriate    Precautions / Restrictions Precautions Precautions: Fall Restrictions Weight Bearing Restrictions: No   Pertinent Vitals/Pain Denies pain.    Mobility  Bed Mobility Bed Mobility: Supine to Sit;Sitting - Scoot to Edge of Bed Supine to Sit: 4: Min assist;With rails Sitting - Scoot to Edge of Bed: 3: Mod assist Details for Bed Mobility Assistance: pt requiring increased A today to complete bed mobility.  cues for sequencing and encouragement.   Transfers Transfers: Sit to Stand;Stand to [employer] Transfers Sit to Stand: 3: Mod assist;With upper extremity assist;From bed Stand to Sit: 4: Min assist;With upper extremity assist;To chair/3-in-1;With armrests Stand Pivot Transfers: 4: Min assist;With armrests Details for Transfer Assistance: cues for UE use and to pivot all the way to the chair. and control descent Ambulation/Gait Ambulation/Gait Assistance: Not tested (comment) (Too fatigued after standing for peri hygiene) Stairs: No Wheelchair Mobility Wheelchair Mobility: No    Exercises     PT  Diagnosis:    PT Problem List:   PT Treatment Interventions:     PT Goals Acute Rehab PT Goals Time For Goal Achievement: 05/11/12 PT Goal: Supine/Side to Sit - Progress: Progressing toward goal PT Goal: Sit to Stand - Progress: Progressing toward goal PT Goal: Stand to Sit - Progress: Progressing toward goal PT Transfer Goal: Bed to Chair/Chair to Bed - Progress: Progressing toward goal  Visit Information  Last PT Received On: 04/29/12 Assistance Needed: +1    Subjective Data  Subjective: I wet the bed.     Cognition  Overall Cognitive Status: Appears within functional limits for tasks assessed/performed Arousal/Alertness: Awake/alert Orientation Level: Oriented X4 / Intact Behavior During Session: [facility] for tasks performed    Balance  Balance Balance Assessed: Yes Static Standing Balance Static Standing - Balance Support: Bilateral upper extremity supported;During functional activity Static Standing - Level of Assistance: 4: Min assist Static Standing - Comment/# of Minutes: pt able to stand while PT performed peri hygiene.  pt fatigued after standing for ~2-59mins.    End of Session PT - End of Session Equipment Utilized During Treatment: Gait belt Activity Tolerance: Patient limited by fatigue Patient left: in chair;with call bell/phone within reach Nurse Communication: Mobility status   GP     Sunny Schlein, [place] [phone] 04/29/2012, 2:30 PM

## 2012-04-29 NOTE — Progress Notes (Signed)
Inpatient Diabetes Program Recommendations  AACE/ADA: New Consensus Statement on Inpatient Glycemic Control (2013)  Target Ranges:  Prepandial:   less than 140 mg/dL      Peak postprandial:   less than 180 mg/dL (1-2 hours)      Critically ill patients:  140 - 180 mg/dL   Reason for Visit: Pt getting 8 units correction q 4 hrs for over 24 hrs with cbg's remaining in mid-upper 200's, totaling 48 units correction in the last 24hrs.  Inpatient Diabetes Program Recommendations Insulin - Basal: Increase to 20 units (0.2 units/kg is minimal starting dose). Correction (SSI): Increase to resistant q 4 hrs. May be better able to calculate actual doses needed for basal and meal coverage once glucose control is established.  Note: Thank you, Lenor Coffin, RN, CNS, Diabetes Coordinator [phone])

## 2012-04-29 NOTE — Telephone Encounter (Signed)
Called pt's dtr. She reports, that the pt is in the hospital right now. Social worker at the hospital is helping pt with placement of Nursing Home after d/c from Hospital. I told her, to call us back if she needs any more help from [place]. She agreed. Lorenda Hatchet, [name]

## 2012-04-30 ENCOUNTER — Ambulatory Visit: Payer: Medicare HMO | Admitting: Family Medicine

## 2012-04-30 DIAGNOSIS — Z6841 Body Mass Index (BMI) 40.0 and over, adult: Secondary | ICD-10-CM

## 2012-04-30 DIAGNOSIS — R531 Weakness: Secondary | ICD-10-CM | POA: Diagnosis present

## 2012-04-30 LAB — BASIC METABOLIC PANEL
CO2: 33 mEq/L — ABNORMAL HIGH (ref 19–32)
Calcium: 9.4 mg/dL (ref 8.4–10.5)
Chloride: 97 mEq/L (ref 96–112)
Glucose, Bld: 182 mg/dL — ABNORMAL HIGH (ref 70–99)
Potassium: 4.3 mEq/L (ref 3.5–5.1)
Sodium: 135 mEq/L (ref 135–145)

## 2012-04-30 LAB — GLUCOSE, CAPILLARY
Glucose-Capillary: 168 mg/dL — ABNORMAL HIGH (ref 70–99)
Glucose-Capillary: 204 mg/dL — ABNORMAL HIGH (ref 70–99)
Glucose-Capillary: 219 mg/dL — ABNORMAL HIGH (ref 70–99)
Glucose-Capillary: 291 mg/dL — ABNORMAL HIGH (ref 70–99)

## 2012-04-30 LAB — CBC
HCT: 31.7 % — ABNORMAL LOW (ref 36.0–46.0)
Hemoglobin: 10 g/dL — ABNORMAL LOW (ref 12.0–15.0)
MCV: 91.6 fL (ref 78.0–100.0)
Platelets: 157 10*3/uL (ref 150–400)
RBC: 3.46 MIL/uL — ABNORMAL LOW (ref 3.87–5.11)
WBC: 6.9 10*3/uL (ref 4.0–10.5)

## 2012-04-30 MED ORDER — VITAMIN D (ERGOCALCIFEROL) 1.25 MG (50000 UNIT) PO CAPS: 50000.0000 [IU] | ORAL_CAPSULE | ORAL | Status: DC

## 2012-04-30 MED ORDER — INSULIN GLARGINE 100 UNIT/ML ~~LOC~~ SOLN: 20.0000 [IU] | Freq: Every day | SUBCUTANEOUS | Status: DC

## 2012-04-30 MED ORDER — INSULIN GLARGINE 100 UNIT/ML ~~LOC~~ SOLN
15.0000 [IU] | Freq: Every day | SUBCUTANEOUS | Status: DC
  Administered 2012-04-30: 15 [IU] via SUBCUTANEOUS

## 2012-04-30 NOTE — Progress Notes (Signed)
Physical Therapy Treatment Patient Details Name: [name] [name] MRN: [id number] DOB: Aug 03, 1933 Today's Date: 04/30/2012 Time: 2130-8657 PT Time Calculation (min): 38 min  PT Assessment / Plan / Recommendation Comments on Treatment Session  Pt presents with FTT, falls, rt heel blister and recent hospital admit for PNA.  Fatigues quickly.    Follow Up Recommendations  Post acute inpatient     Does the patient have the potential to tolerate intense rehabilitation  No, Recommend SNF  Barriers to Discharge        Equipment Recommendations  None recommended by PT;None recommended by OT    Recommendations for Other Services    Frequency Min 3X/week   Plan Discharge plan remains appropriate;Frequency remains appropriate    Precautions / Restrictions     Pertinent Vitals/Pain Pain 6/10 in shoulders.  Nursing medicated with tylenol.    Mobility  Bed Mobility Bed Mobility: Sit to Supine Supine to Sit: 4: Min assist Sitting - Scoot to Edge of Bed: 3: Mod assist Sit to Supine: 2: Max assist Scooting to HOB: 4: Min assist (HOB in trendelenburg) Transfers Sit to Stand: 3: Mod assist;With upper extremity assist;From bed Stand to Sit: 4: Min assist;With upper extremity assist;To bed Details for Transfer Assistance: Assist under hips to provide lift.  Performed 5 times. Ambulation/Gait Ambulation/Gait Assistance: 4: Min assist Ambulation Distance (Feet): 3 Feet Assistive device: Rolling walker Ambulation/Gait Assistance Details: side stepped up side of bed Gait Pattern: Trunk flexed    Exercises General Exercises - Lower Extremity Long Arc Quad: Strengthening;Both;10 reps;Seated   PT Diagnosis:    PT Problem List:   PT Treatment Interventions:     PT Goals Acute Rehab PT Goals PT Goal Formulation: With patient PT Goal: Supine/Side to Sit - Progress: Progressing toward goal PT Goal: Sit to Supine/Side - Progress: Progressing toward goal PT Goal: Sit to Stand - Progress:  Progressing toward goal PT Goal: Stand to Sit - Progress: Progressing toward goal PT Goal: Ambulate - Progress: Progressing toward goal PT Goal: Perform Home Exercise Program - Progress: Progressing toward goal  Visit Information  Last PT Received On: 04/30/12 Assistance Needed: +1    Subjective Data  Subjective: Pt stated her shoulders were bothering her.   Cognition  Overall Cognitive Status: Appears within functional limits for tasks assessed/performed Arousal/Alertness: Awake/alert Orientation Level: Appears intact for tasks assessed Behavior During Session: [facility] for tasks performed    Balance  Static Standing Balance Static Standing - Balance Support: Bilateral upper extremity supported (on walker) Static Standing - Level of Assistance: 4: Min assist Static Standing - Comment/# of Minutes: Stood x 5 for 2-3 minutes each time with cues to fully extend hips and trunk.  End of Session PT - End of Session Equipment Utilized During Treatment: Gait belt Activity Tolerance: Patient tolerated treatment well Patient left: in bed;with call bell/phone within reach Nurse Communication: Mobility status   GP     Jocob Dambach 04/30/2012, 12:02 PM  [employer] PT [phone]

## 2012-04-30 NOTE — Discharge Summary (Signed)
I have reviewed this discharge summary and agree.    

## 2012-04-30 NOTE — Progress Notes (Signed)
Clinical Social Work  CSW spoke with patient at bedside while she was on the phone with her dtr Okey Regal). Patient and dtr reported that since patient has been to [facility] in the past that patient feels most comfortable returning to their facility. CSW called SNF who is agreeable to admission and has available beds. SNF agreeable to submit information to insurance company. CSW faxed clinicals to [facility] representative Annetta Maw) who reports she will help expedite the process once SNF submits information. CSW will continue to follow and will dc patient as soon as insurance approval is received.  [place], [place] [phone]

## 2012-04-30 NOTE — Clinical Documentation Improvement (Signed)
BMI DOCUMENTATION CLARIFICATION QUERY  THIS DOCUMENT IS NOT A PERMANENT PART OF THE MEDICAL RECORD  TO RESPOND TO THE THIS QUERY, FOLLOW THE INSTRUCTIONS BELOW:  1. If needed, update documentation for the patient's encounter via the notes activity.  2. Access this query again and click edit on the In [employer].  3. After updating, or not, click F2 to complete all highlighted (required) fields concerning your review. Select "additional documentation in the medical record" OR "no additional documentation provided".  4. Click Sign note button.  5. The deficiency will fall out of your In Basket *Please let us know if you are not able to complete this workflow by phone or e-mail (listed below).         04/30/12  Dear Dr. Thayer [facility]m [facility]  In an effort to better capture your patient's severity of illness, reflect appropriate length of stay and utilization of resources, a review of the patient medical record has revealed the following indicators.    Based on your clinical judgment, please clarify and document in a progress note and/or discharge summary the clinical condition associated with the following supporting information:  In responding to this query please exercise your independent judgment.  The fact that a query is asked, does not imply that any particular answer is desired or expected.  Noted patient's BMI 44  also documenting "Obesity hypoventilation syndrome".   If possible please provide secondary diagnosis for weight if appropriate.  Thank you   Possible Clinical conditions   Morbid Obesity (range 40-49.9) --> present on admission, added to problem list/prog note   Treatment Nutrition Note: Diet carb modified.  Reviewed: additional documentation in the medical record; see my note 11/5, A&P #2  Thank You,  Leonette Most Addison  Clinical Documentation Specialist RN, BSN:   Pager [phone]// HIM off # [phone]  Health Information Management [facility]

## 2012-04-30 NOTE — Progress Notes (Signed)
Occupational Therapy Treatment Patient Details Name: [name] [name] MRN: [id number] DOB: 07/19/1933 Today's Date: 04/30/2012 Time: 1478-2956 OT Time Calculation (min): 30 min  OT Assessment / Plan / Recommendation Comments on Treatment Session Pt still with the need for mod facilitation for functional selfcare related transfers, mobility, and simulated selfcare tasks.  Recommend continued rehab as SNF level.  Planned transfer later today.    Follow Up Recommendations  SNF    Barriers to Discharge       Equipment Recommendations  None recommended by OT    Recommendations for Other Services    Frequency Min 2X/week   Plan Discharge plan remains appropriate    Precautions / Restrictions Precautions Precautions: Fall Restrictions Weight Bearing Restrictions: No   Pertinent Vitals/Pain O2 sats 96% on room air after transferring back to bed,    ADL  Grooming: Performed;Minimal assistance;Wash/dry hands Where Assessed - Grooming: Supported Copywriter, advertising: Performed;Moderate assistance Toilet Transfer Method: Stand pivot Toilet Transfer Equipment: Bedside commode Toileting - Clothing Manipulation and Hygiene: Performed;Moderate assistance Where Assessed - Toileting Clothing Manipulation and Hygiene: Sit to stand from 3-in-1 or toilet Transfers/Ambulation Related to ADLs: Pt ambulated with hand held assist and mod facilitation to the sink for grooming task. ADL Comments: Pt able to transfer to EOB with HOB up and min assist.  Required mod facilitation for stand pivot transfer to bedside toilet.  Exhibits moderate trunk flexion in standing and with mobility.     OT Goals Acute Rehab OT Goals Time For Goal Achievement: 05/11/12 ADL Goals ADL Goal: Grooming - Progress: Progressing toward goals ADL Goal: Toilet Transfer - Progress: Progressing toward goals ADL Goal: Toileting - Clothing Manipulation - Progress: Progressing toward goals ADL Goal: Toileting - Hygiene -  Progress: Progressing toward goals  Visit Information  Last OT Received On: 04/30/12 Assistance Needed: +1    Subjective Data  Subjective: "I feel like I have to go to the bathroom." Patient Stated Goal: Pt requested to go to the bathroom.      Cognition  Overall Cognitive Status: Appears within functional limits for tasks assessed/performed Arousal/Alertness: Awake/alert Orientation Level: Appears intact for tasks assessed Behavior During Session: [facility] for tasks performed    Mobility  Shoulder Instructions Bed Mobility Bed Mobility: Supine to Sit Supine to Sit: 4: Min assist;HOB elevated Sitting - Scoot to Edge of Bed: 3: Mod assist Sit to Supine: 3: Mod assist;HOB flat Scooting to HOB: 4: Min assist (HOB in trendelenburg) Details for Bed Mobility Assistance: Pt requires assistance with lifting LEs in the bed with transition sit to supine. Transfers Transfers: Sit to Stand Sit to Stand: With upper extremity assist;3: Mod assist Stand to Sit: 3: Mod assist;With upper extremity assist Details for Transfer Assistance: Assist under hips to provide lift.  Performed 5 times.       Exercises  General Exercises - Lower Extremity Long Arc Quad: Strengthening;Both;10 reps;Seated   Balance Balance Balance Assessed: Yes Static Standing Balance Static Standing - Balance Support: Bilateral upper extremity supported (on walker) Static Standing - Level of Assistance: 4: Min assist Static Standing - Comment/# of Minutes: Stood x 5 for 2-3 minutes each time with cues to fully extend hips and trunk. Dynamic Standing Balance Dynamic Standing - Balance Support: Right upper extremity supported Dynamic Standing - Level of Assistance: 3: Mod assist   End of Session OT - End of Session Activity Tolerance: Patient limited by fatigue;Patient limited by pain Patient left: in bed;with call bell/phone within reach Nurse Communication: Mobility status  [name] [name] OTR/L Pager number  [phone] 04/30/2012, 3:41 PM

## 2012-04-30 NOTE — Progress Notes (Signed)
FMTS Daily Intern Progress Note  Subjective:  Seen at bedside this morning. Feels okay this morning, still feels weak, especially in shoulders Denies N/V, fever, abd pain, chest pain. No SOB, off O2 this morning.  Objective Temp:  [97.7 F (36.5 C)-98.2 F (36.8 C)] 97.7 F (36.5 C) (11/05 0431) Pulse Rate:  [56-66] 56  (11/05 0431) Resp:  [16-18] 16  (11/05 0431) BP: (130-139)/(52-77) 130/52 mmHg (11/05 0431) SpO2:  [96 %-100 %] 100 % (11/05 0431)   Intake/Output Summary (Last 24 hours) at 04/30/12 0912 Last data filed at 04/30/12 0850  Gross per 24 hour  Intake    283 ml  Output      0 ml  Net    283 ml   CBG (last 3)   Basename 04/30/12 0810 04/30/12 0429 04/30/12 0028  GLUCAP 168* 181* 219*   General: adult female, appears sleepy and yawns frequently but appropriately responsive; NAD CV: S1S2, RRR, no murmur appreciated Pulm: clear to auscultation with normal work of breathing Abd: soft, non tender, BS+ Ext: no edema, moves all extremities spontaneously but with reduced ROM in shoulders, no intention tremor of upper extremities Skin: bilateral feet with heel protectors in place, foam dressing in place over previously-noted heel ulcer present on admission  Labs and Imaging  Lab 04/30/12 0545 04/28/12 0530 04/27/12 0530  WBC 6.9 7.2 7.5  HGB 10.0* 10.2* 10.1*  HCT 31.7* 32.2* 32.3*  PLT 157 185 200    Lab 04/30/12 0545 04/28/12 0530 04/27/12 0530  NA 135 136 138  K 4.3 4.3 4.0  CL 97 100 101  CO2 33* 29 34*  BUN 22 23 24*  CREATININE 1.31* 1.28* 1.32*  LABGLOM -- -- --  GLUCOSE 182* -- --  CALCIUM 9.4 8.5 8.4   CT, 11/1 @1626  IMPRESSION:  No evidence of traumatic injury to the cervical spine.  Extensive multilevel degenerative changes, most severe at C5-6.  MRI, 11/2 @1709  IMPRESSION:  Multilevel spondylosis as described. Central protrusions resulting  in spinal stenosis most notable at C3-C4. Non compressive synovial  cyst C1-2, right. No visible  cervical spine fracture or  intraspinal hematoma. No abnormal cord signal.  Assessment and Plan  78 yo female with h/o HTN, DM2, OSA (not on cpap at home) who presents with generalized weakness and failure to thrive. Admitted 11/1.  1. Weakness: likely multifactorial from chronic lower extremity weakness and deconditioning. Family and patient not willing to go to SNF at last hospitalization and inpt rehab not covered by pt insurance. MRI shows cervical spinal stenosis/spondylosis but no abnormal cord signal. - Blood cultures show NGTD, urine cx with mixed growth/no predominance - ESR 45 (age-corrected expected = (76+10)/2 = 43) - monitor myoclonic movements; no complaints 11/3-5, no jerking movements witnessed on exams - continue Lyrica and Requip - Pt agreeable to at least short-term SNF; see dispo  2. Obstructive sleep apnea and likely obesity hypoventilation syndrome with elevated bicarb on BMP. - Pt's BMI is 43.9 (morbid obesity, present on admission)  - CPAP overnight for sleep apnea, if patient can tolerate; pt refusing for now but will request if she feels SOB  - intermittent O2 as needed for O2<92%. Patient is on home O2 at night   3. Hyperglycemia/DM type 2: elevated CBG's on admit. On glipizide at home. -CBG elevated to 401 last night, improved with Novolog 15 - will increase to Lantus 15 units daily, SSI; continue to hold glipizide - likely will continue insulin management rather than orals, after  discharge to SNF   4. Heel pressure ulcer without evidence of cellulitis  - Remains Afebrile with normal WBC; will monitor for any signs of infection  - Local wound care, heel elevation/protectors  5. H/o HTN: some intermittent elevations - monitor, continue home metoprolol 25mg  daily  6. CKD: creatinine at baseline: baseline 1.2-1.4  -Cr around baseline, 1.28 on 11/3 and 1.31 on 11/5, will monitor -Restarted home Lasix 11/4  7. Vitamin D deficiency - Noted last outpt visit, to  start vitamin D 13244 units weekly. Started 11/4.  8. Fen/Gi: carb modified diet, kvo 9. PPx: heparin sub cu  10. Dispo: PT/OT recommending SNF, SW involved in bed search. Assistance appreicated  [address], [place] Pager: [phone] 04/30/2012, 9:12 AM

## 2012-04-30 NOTE — Progress Notes (Signed)
Clinical Social Work  CSW received authorization from [facility]. CSW faxed dc summary to [facility] who is agreeable to admission. SNF reported that patient would have copays and that Medicaid would assist with copays. CSW made patient aware of these copays with SNF and she is agreeable to SNF placement. CSW informed dtrs [name] and Okey Regal) as well about dc. RN is aware and called report. CSW prepared dc packet and coordinated transportation via [facility]. CSW is signing off.  [place], [place] [phone]

## 2012-04-30 NOTE — Progress Notes (Signed)
Family Medicine Teaching Service Attending Note  I interviewed and examined patient Floor and reviewed their tests and x-rays.  I discussed with Dr. Casper Harrison and reviewed their note for today.  I agree with their assessment and plan.     Additionally  Alert no acute distress Medically stable for discharge to SNF

## 2012-05-01 NOTE — Progress Notes (Signed)
Late entry [facility] lab called @ 4:44am on 04/30/2012 to report  positive  blood culture. Culture gram positive rods.. Report faxed to  [facility]

## 2012-05-02 LAB — CULTURE, BLOOD (ROUTINE X 2): Culture: NO GROWTH

## 2012-05-03 ENCOUNTER — Telehealth: Payer: Self-pay | Admitting: Family Medicine

## 2012-05-03 LAB — CULTURE, BLOOD (ROUTINE X 2)

## 2012-05-03 NOTE — Telephone Encounter (Signed)
Received result from blood culture: #1 out of 2 showing diphteroids (corynebacterium). Discussed result with Dr. [name] and thought was that this was likely contaminant.  I called patient at the nursing home at [facility] and she told me that she had been feeling well aside from a cough. She has not been having any fevers or chills. I also called her daughter at [phone] and spoke directly with her. She confirmed that she had been doing well and gaining in strength with the rehab. Both patient and her daughter aware of the blood results. Explained to both of them that since she appears to be doing clinically well, this is likely a contaminant. They both expressed understanding.

## 2012-05-08 ENCOUNTER — Ambulatory Visit (INDEPENDENT_AMBULATORY_CARE_PROVIDER_SITE_OTHER): Payer: Medicare HMO | Admitting: Family Medicine

## 2012-05-08 ENCOUNTER — Telehealth: Payer: Self-pay | Admitting: Family Medicine

## 2012-05-08 ENCOUNTER — Encounter: Payer: Self-pay | Admitting: Family Medicine

## 2012-05-08 ENCOUNTER — Ambulatory Visit: Payer: Medicare HMO | Admitting: Family Medicine

## 2012-05-08 VITALS — BP 136/62 | HR 68 | Ht 60.0 in | Wt 224.0 lb

## 2012-05-08 DIAGNOSIS — E1149 Type 2 diabetes mellitus with other diabetic neurological complication: Secondary | ICD-10-CM

## 2012-05-08 LAB — GLUCOSE, CAPILLARY: Glucose-Capillary: 460 mg/dL — ABNORMAL HIGH (ref 70–99)

## 2012-05-08 NOTE — Assessment & Plan Note (Signed)
CBG in clinic today 460.  Novolog 10 units given in clinic. - Increase Lantus from 20 units to 25 units daily - Increase Lantus by 2 units each day until CBG < 120 - Continue SSI  - Notify MD if CBG < 60 or > 400 - Order written for Nursing home to give patient carb modified, HH diet - Follow up appointment with Dr. Sheffield Slider in 2 weeks

## 2012-05-08 NOTE — Telephone Encounter (Signed)
Forward to PCP to review

## 2012-05-08 NOTE — Progress Notes (Signed)
  Subjective:    Patient ID: [name] [name], female    DOB: [birth year], 78 y.o.   MRN: [id number]  HPI  Patient presents to same day appointment for elevated CBG.  Patient was recently discharged from hospital and is currently residing at [facility].  Daughter provides history for patient.  Daughter is quite unhappy with her mother's care at [facility].    Patient's CBG in clinic was 460.  She is currently taking Lantus 20 units daily and Novolog SSI for meal coverage.  Daughter is concerned because SNF has not been giving her a low carb diet.  Patient has been eating desserts, white bread, potatoes, rice, etc.  Patient denies any blurry vision, nausea/vomiting, fatigue, or weakness.  Denies any polyuria, dysuria.  Patient has a hx of peripheral neuropathy but this has been well controlled on Lyrica.  Denies any open foot ulcers at this time.  She has an eye doctor.   Review of Systems Per HPI    Objective:   Physical Exam  Constitutional: She appears well-nourished. No distress.  Musculoskeletal:       1+ pitting edema around ankles bilaterally  Skin:       Diabetic foot exam: normal pin-prick sensation; healing wound posterior RT heel; no open wounds or ulcers      Assessment & Plan:

## 2012-05-08 NOTE — Patient Instructions (Addendum)
Orders/Instructions for Nursing Home Staff:  1. Diet: carb modified, heart healthy 2. Give Lantus 25 units daily and increase by 2 units daily until CBG < 120.  Please notify physician when using Lantus 35 units ( call [phone]) 3. Continue sliding scale for meal time coverage 4. Call physician if CBG < 60 or > 400 5. Next appointment with Dr. Sheffield Slider is 05/21/12 @ 1:45 PM ( call [phone])

## 2012-05-08 NOTE — Telephone Encounter (Signed)
Patients daughter called to schedule an appt with Dr. Sheffield Slider because she is concerned about her Blood Sugar.  She was disappointed that the doctor for the facility her mom is at, has not come to see her and she is concerned about her moms heart, kidneys, legs and feet.  Dr. Sheffield Slider didn't have anything until tomorrow, so she was scheduled with Dr. Tye Savoy this afternoon.  She expressed concern about her seeing a different doctor and that her mom may not receive the same quality of care as from Dr. Sheffield Slider.  She did not ask me to send this message, but I thought Dr. Sheffield Slider might want to know so he can check in on Ms. Hornsby.

## 2012-05-14 ENCOUNTER — Telehealth: Payer: Self-pay | Admitting: Family Medicine

## 2012-05-14 NOTE — Telephone Encounter (Signed)
Called [name] [name]. Left message to call us back with specifics. Lorenda Hatchet, [name]

## 2012-05-14 NOTE — Telephone Encounter (Signed)
Review of the chart reveals that we haven't had her on Novulin.. I will call the nurse tomorrow to determine if this had been used in the nursing home and what dose.

## 2012-05-14 NOTE — Telephone Encounter (Signed)
Darl Pikes from [facility] called back. She reports, that pt's blood sugar is up in 200's and 300's. Pt needs refill of Novolog. I will ask Dr.Hale to send refill to her pharmacy [facility].  Also, she reports pt has pressure ulcer on right heel, very spongy. Size: 2.2 cm x 2.2 cm. They elevate foot. Will monitor her DM/HTN.  PT/OT comes to pt's home.  Fwd. To Dr.Hale for review and refill. Thank you,  .Arlyss Repress

## 2012-05-14 NOTE — Telephone Encounter (Signed)
Nurse is calling about the orders for Home Care.  She has gone out to start home care services.

## 2012-05-15 ENCOUNTER — Telehealth: Payer: Self-pay | Admitting: Family Medicine

## 2012-05-15 NOTE — Telephone Encounter (Signed)
I subsequently spoke with nurse True and reviewed the insulin orders. She is treating the 2 cm heel pressure sore with pressure relief.

## 2012-05-15 NOTE — Telephone Encounter (Signed)
I called daughter, [name] who said that her mother has not signs of infection and is slowly getting stronger. Her fasting was 193 this AM and 256 today before lunch. She hasn't been increasing the Lantus so she is taking 25 units nightly. The Novulin was called in a day late by the NH attending and the sliding scale dosing has been 2 units for 150 - 199 and 2 more units for each 50 glucose level up to 400 when the doctor is to be called. I asked her to continue the coverage and increase the Lantus gradually as previously directed. She will call if her mother has any hypoglycemic symptoms

## 2012-05-15 NOTE — Telephone Encounter (Signed)
The Physical Therapist is calling for orders 2wk4.  A verbal ok is ok.

## 2012-05-15 NOTE — Telephone Encounter (Signed)
Forward to Dr Sheffield Slider to review

## 2012-05-15 NOTE — Telephone Encounter (Signed)
Has this been resolved?

## 2012-05-15 NOTE — Telephone Encounter (Signed)
I left a message approving the PT orders.

## 2012-05-21 ENCOUNTER — Telehealth: Payer: Self-pay | Admitting: Family Medicine

## 2012-05-21 ENCOUNTER — Encounter: Payer: Self-pay | Admitting: Family Medicine

## 2012-05-21 ENCOUNTER — Ambulatory Visit (INDEPENDENT_AMBULATORY_CARE_PROVIDER_SITE_OTHER): Payer: Medicare HMO | Admitting: Family Medicine

## 2012-05-21 VITALS — BP 145/67 | HR 82 | Ht 60.0 in | Wt 227.0 lb

## 2012-05-21 DIAGNOSIS — E1149 Type 2 diabetes mellitus with other diabetic neurological complication: Secondary | ICD-10-CM

## 2012-05-21 DIAGNOSIS — R5381 Other malaise: Secondary | ICD-10-CM

## 2012-05-21 DIAGNOSIS — D510 Vitamin B12 deficiency anemia due to intrinsic factor deficiency: Secondary | ICD-10-CM

## 2012-05-21 DIAGNOSIS — R531 Weakness: Secondary | ICD-10-CM

## 2012-05-21 MED ORDER — ALLOPURINOL 100 MG PO TABS: 200.0000 mg | ORAL_TABLET | Freq: Every day | ORAL | Status: DC

## 2012-05-21 MED ORDER — CYANOCOBALAMIN 1000 MCG/ML IJ SOLN
1000.0000 ug | Freq: Once | INTRAMUSCULAR | Status: AC
  Administered 2012-05-21: 1000 ug via INTRAMUSCULAR

## 2012-05-21 MED ORDER — METOPROLOL SUCCINATE ER 25 MG PO TB24: 25.0000 mg | ORAL_TABLET | Freq: Every day | ORAL | Status: DC

## 2012-05-21 MED ORDER — ALLOPURINOL 100 MG PO TABS: 100.0000 mg | ORAL_TABLET | Freq: Every day | ORAL | Status: DC

## 2012-05-21 MED ORDER — ALBUTEROL SULFATE HFA 108 (90 BASE) MCG/ACT IN AERS: 2.0000 | INHALATION_SPRAY | Freq: Four times a day (QID) | RESPIRATORY_TRACT | Status: DC | PRN

## 2012-05-21 MED ORDER — INSULIN GLARGINE 100 UNIT/ML ~~LOC~~ SOLN: 20.0000 [IU] | Freq: Every day | SUBCUTANEOUS | Status: DC

## 2012-05-21 NOTE — Telephone Encounter (Signed)
PT is calling to ok decrease to 1xwk this week due to [place].  A verbal ok is ok.

## 2012-05-21 NOTE — Assessment & Plan Note (Signed)
Improving control. I asked [name] [name] to increase up to 40 units twice daily of Lantus then maintain there. After a couple weeks she can stop the Glipizide and see if her sugars go up. We will try to determine usual meal coverage next visit to try to decrease glucometer checks.

## 2012-05-21 NOTE — Assessment & Plan Note (Signed)
improving

## 2012-05-21 NOTE — Patient Instructions (Addendum)
Please return to see Dr Sheffield Slider in 1 month  Increase the Lantus to 38 then 40 units twice daily. If blood sugars are well controlled you can stop the Glipizide  Try to keep the meal sizes the same daily.

## 2012-05-21 NOTE — Progress Notes (Signed)
  Subjective:    Patient ID: [name] [name], female    DOB: Jul 09, 1933, 78 y.o.   MRN: [id number]  HPI Rehab - Improving strength, walking in the home with the walker. Doesn't like to exercise on days PT not visiting.   Desaturation - Daughter says she had CPAP years ago based on a sleep study. Didn't like to wear the mask and no longer has it. The NH doctor had ordered a 24 hr pulse ox, but she wouldn't wear it while sleeping. She had some desaturations down to 85% when she did have it on, but averaged around 90%. Since she doesn't want to wear O2 I will monitor this for now. Daughter, [name] says that he mother doesn't have apneic spells while sleeping and is no longer lethargic during the days.   DIABETES MELLITUS - Carolyin has gradually increased her Lantus which is now up to 36 units twice daily. She's giving 2-6 units before meals based on the sliding scale. No hypoglycemic symptoms Fasting today 166 and 12 PM 232. Fasting yesterday 159 and at 6:30 PM 114.  Heel pain - continues to be sore on right Review of Systems  see HPI for ROS     Objective:   Physical Exam  Constitutional:       Generalized obesity   Cardiovascular: Normal rate and regular rhythm.   No murmur heard. Pulmonary/Chest: Effort normal and breath sounds normal. She has no wheezes. She has no rales.  Musculoskeletal: She exhibits edema.       1+ bilateral ankle edema  Stooped posture when walking with the walker  Neurological: She is alert.  Skin: Skin is warm and dry.       No erythema or other skin changes right heel, but tender  Psychiatric: She has a normal mood and affect. Her behavior is normal. Judgment and thought content normal.          Assessment & Plan:

## 2012-05-22 ENCOUNTER — Telehealth: Payer: Self-pay | Admitting: Family Medicine

## 2012-05-22 MED ORDER — INSULIN GLARGINE 100 UNIT/ML ~~LOC~~ SOLN: 40.0000 [IU] | Freq: Every day | SUBCUTANEOUS | Status: DC

## 2012-05-22 NOTE — Telephone Encounter (Signed)
Called [place] and gave verbal order. Lorenda Hatchet, [name]

## 2012-05-22 NOTE — Telephone Encounter (Signed)
I called the patient to let her know that I sent in a revised Lantus dose of 40 IU daily. She is feeling well today.

## 2012-05-22 NOTE — Telephone Encounter (Signed)
Physical Therapy is calling to change the frequency of therapy.  He would like to speak to the nurse about the orders.

## 2012-05-22 NOTE — Telephone Encounter (Signed)
Called. ok'd one PT per week for this week. Lorenda Hatchet, [name]

## 2012-05-27 ENCOUNTER — Encounter: Payer: Self-pay | Admitting: Family Medicine

## 2012-05-27 ENCOUNTER — Ambulatory Visit: Payer: Medicare HMO

## 2012-05-27 ENCOUNTER — Ambulatory Visit (INDEPENDENT_AMBULATORY_CARE_PROVIDER_SITE_OTHER): Payer: Medicare HMO | Admitting: Family Medicine

## 2012-05-27 VITALS — BP 125/65 | HR 70 | Temp 98.6°F | Ht 60.0 in | Wt 228.0 lb

## 2012-05-27 DIAGNOSIS — E1149 Type 2 diabetes mellitus with other diabetic neurological complication: Secondary | ICD-10-CM

## 2012-05-27 DIAGNOSIS — J209 Acute bronchitis, unspecified: Secondary | ICD-10-CM

## 2012-05-27 LAB — GLUCOSE, CAPILLARY: Glucose-Capillary: 175 mg/dL — ABNORMAL HIGH (ref 70–99)

## 2012-05-27 MED ORDER — LEVOFLOXACIN 500 MG PO TABS: 500.0000 mg | ORAL_TABLET | Freq: Every day | ORAL | Status: DC

## 2012-05-27 MED ORDER — E-Z SPACER DEVI: Status: DC

## 2012-05-27 MED ORDER — ALBUTEROL SULFATE HFA 108 (90 BASE) MCG/ACT IN AERS: 2.0000 | INHALATION_SPRAY | RESPIRATORY_TRACT | Status: DC | PRN

## 2012-05-27 MED ORDER — BENZONATATE 100 MG PO CAPS: 100.0000 mg | ORAL_CAPSULE | Freq: Three times a day (TID) | ORAL | Status: DC | PRN

## 2012-05-27 NOTE — Progress Notes (Signed)
Medical Nutrition Therapy:  Appt start time: 1100 end time:  1200.  Assessment:  Primary concerns today: Weight management and Blood sugar control.  [name] [name] was accompanied by her daughter [name] [name], with whom she is now living.  [name] [name] confided in me while her mom was out of the room that [name] [name] usually refuses to do any of the exercises recommended by the physical therapists.  Currently, [name] [name] does all food preparation for her mom, and apparently acts as gatekeeper ("She wanted a milkshake the other day, and I would not let her have one.")  [name] [name] did ask if there are any recipes for desserts suitable for diabetes, and I suggested she consult the internet for artificially sweetened desserts, keeping in mind that desserts with a protein component, i.e., dairy, may be better for BG response.  I also cautioned that "sugar-free" labels do not necessarily mean carbohydrate-free.   Usual eating pattern includes 3 meals and 2-3 snacks per day. Usual physical activity includes in home PT 2 X wk.     Everyday foods include 3 c coffee with Sweet 'n Low, 1 c diet Sierra Mist, 1 c flavored water, 1 1/2 c 2% milk, green beans, sweet potatoes (2 X wk).  Avoided foods include none.   24-hr recall: (Up 6:45 AM)  B (7:30 AM)-   1 scrambled egg in spray oil, 1 toast w/ fake butter & cinnamon, 1 c coffee with Sweet 'n Low Snk (10 AM)-   1 c coffee with Sweet 'n Low, 6 oz diet Yoplait L (12 PM)-  2 c chili beans, 4 low-fat saltines, 1 piece of cornbread, 1 c 2% milk Snk ( PM)-  none D (5 PM)-  1 sweet potato, 1 c green beans, 2 oz [place] Snk ( PM)-  6 oz diet Yoplait, diet Sierra Mist  Progress Towards Goal(s):  In progress.   Nutritional Diagnosis:  NB-2.1 Physical inactivity As related to lack of motivation.  As evidenced by no physical activity outside of PT sessions 2 X wk.    Intervention:  Nutrition education.  Monitoring/Evaluation:  Dietary intake, exercise, BG, and body weight prn.   Patient said it is difficult for her to get here frequently; daughter will call w/ Qs if any.

## 2012-05-27 NOTE — Progress Notes (Signed)
  Subjective:    Patient ID: [name] [name], female    DOB: 14-Feb-1934, 78 y.o.   MRN: [id number]  HPI 79 y.o. diabetic female with cough for 1 week with green sputum. Started with sore throat. No fever/chills. Does have chest tightness/wheezing. Cough worse at night. Has hx asthma - not using albuterol inhaler because this brand makes her tongue and mouth sore. Was hospitalized in October with pneumonia and pyelonephritis/bacteremia. Has been in a SNF but now home. Also hospitalized in November for weakness. Diabetes is much better controlled now - most BS are in the 150 range with a few in 200s. Today 144 at home and 175 in office. Using lantus 40 units BID with novolog sliding scale as needed (only using once or twice a day).  Review of Systems  Constitutional: Positive for fatigue. Negative for fever and chills.  HENT: Positive for sore throat. Negative for ear pain, congestion, rhinorrhea, sneezing and postnasal drip.   Respiratory: Positive for cough, chest tightness, shortness of breath and wheezing.   Cardiovascular: Negative for chest pain.  Gastrointestinal: Negative for nausea and vomiting.  Neurological: Negative for dizziness.       Objective:   Physical Exam  Constitutional: She is oriented to person, place, and time. She appears well-developed and well-nourished. No distress.  HENT:  Head: Normocephalic and atraumatic.  Eyes: Conjunctivae normal and EOM are normal.  Neck: Normal range of motion. Neck supple.  Cardiovascular: Normal rate, regular rhythm and normal heart sounds.   Pulmonary/Chest: Effort normal and breath sounds normal. No respiratory distress. She has no wheezes.       Mildly prolonged expiratory phase  Neurological: She is alert and oriented to person, place, and time.  Skin: Skin is warm and dry.  Psychiatric: She has a normal mood and affect.       Pt is quiet but responsive and appropriate. Daughter does most of the talking for her.   Filed Vitals:   05/27/12 1023  BP: 125/65  Pulse: 70  Temp: 98.6 F (37 C)      Assessment & Plan:  78 y.o. female with diabetes, hx recent pneumonia, hospitalization and stay in SNF. - No evidence of pneumonia on exam. Will treat for bronchitis - levaquin for 7 days given healthcare exposure.  Pt cautioned to return or go to ER if symptoms worsen. - Albuterol Pro-Air prescribed with spacer (pt's daughter recognizes red and white inhaler as one she has used before but did not cause mouth sores). - Tessalon for cough - F/U with PCP in one week - discuss anemia, incontinence pads

## 2012-05-27 NOTE — Patient Instructions (Addendum)

## 2012-05-27 NOTE — Patient Instructions (Addendum)
Diet Recommendations for Diabetes   Starchy (carb) foods include: Bread, rice, pasta, potatoes, corn, crackers, bagels, muffins, all baked goods.   Protein foods include: Meat, fish, poultry, eggs, dairy foods, and beans such as pinto and kidney beans (beans also provide carbohydrate).   1. Eat at least 3 meals and 1-2 snacks per day. Never go more than 4-5 hours while awake without eating.  2. Limit starchy foods to TWO per meal and ONE per snack. ONE portion of a starchy  food is equal to the following:   - ONE slice of bread (or its equivalent, such as half of a hamburger bun).   - 1/2 cup of a "scoopable" starchy food such as potatoes or rice.   - 1 OUNCE (28 grams) of starchy snack foods such as crackers or pretzels (look on label).   - 15 grams of carbohydrate as shown on food label.  3. Both lunch and dinner should include a protein food, a carb food, and vegetables.   - Obtain twice as many veg's as protein or carbohydrate foods for both lunch and dinner.   - Try to keep frozen veg's on hand for a quick vegetable serving.     - Fresh or frozen veg's are best.  4. Breakfast should always include protein.      

## 2012-05-28 ENCOUNTER — Ambulatory Visit: Payer: Medicare HMO | Admitting: Family Medicine

## 2012-05-28 ENCOUNTER — Telehealth: Payer: Self-pay | Admitting: Family Medicine

## 2012-05-28 NOTE — Telephone Encounter (Signed)
Needs to talk to nurse about the meds change for her insulin.

## 2012-05-28 NOTE — Telephone Encounter (Signed)
Spoke with daughter and she states none of the RX sent in electronically yesterday were received. They had called back to get verbal order for antibiotic.Marland Kitchen Patient did pick that up.    Daughter states patient has been on Lantus insulin 40 units twice daily  but RX sent in on 11/26 states 40 units once daily.  Gave verbal order to prescribe Lantus 40 units twice daily twice daily  and quantity sufficient for 90 days because daughter requested this . Patient is staying with her now in [place]. This dose is noted in office note from 05/21/2012.

## 2012-05-31 NOTE — Telephone Encounter (Signed)
[facility] is calling because the Physical Therapy appt was missed today and the other day due to the patient not being home.

## 2012-05-31 NOTE — Telephone Encounter (Signed)
Fwd. To PCP for info .[name] [name]  

## 2012-06-04 ENCOUNTER — Ambulatory Visit (INDEPENDENT_AMBULATORY_CARE_PROVIDER_SITE_OTHER): Payer: Medicare HMO | Admitting: Family Medicine

## 2012-06-04 VITALS — BP 134/65 | HR 72 | Temp 98.5°F | Ht 60.0 in | Wt 227.0 lb

## 2012-06-04 DIAGNOSIS — I10 Essential (primary) hypertension: Secondary | ICD-10-CM

## 2012-06-04 DIAGNOSIS — R5381 Other malaise: Secondary | ICD-10-CM

## 2012-06-04 DIAGNOSIS — Z6841 Body Mass Index (BMI) 40.0 and over, adult: Secondary | ICD-10-CM

## 2012-06-04 DIAGNOSIS — E1149 Type 2 diabetes mellitus with other diabetic neurological complication: Secondary | ICD-10-CM

## 2012-06-04 DIAGNOSIS — E669 Obesity, unspecified: Secondary | ICD-10-CM

## 2012-06-04 DIAGNOSIS — R531 Weakness: Secondary | ICD-10-CM

## 2012-06-04 DIAGNOSIS — R32 Unspecified urinary incontinence: Secondary | ICD-10-CM

## 2012-06-04 DIAGNOSIS — J209 Acute bronchitis, unspecified: Secondary | ICD-10-CM

## 2012-06-04 MED ORDER — SOLIFENACIN SUCCINATE 5 MG PO TABS: 10.0000 mg | ORAL_TABLET | Freq: Every day | ORAL | Status: DC

## 2012-06-04 MED ORDER — BENZONATATE 100 MG PO CAPS: 100.0000 mg | ORAL_CAPSULE | Freq: Three times a day (TID) | ORAL | Status: DC | PRN

## 2012-06-04 NOTE — Patient Instructions (Addendum)
Try increase the Vesicare to 2 of the 5 mg tablets daily. If your bladder is responding, call and I'll increase to 10 mg tablets.   Try to keep your weight the same over the holidays.   Please return to see Dr Sheffield Slider in 6 weeks

## 2012-06-04 NOTE — Progress Notes (Signed)
  Subjective:    Patient ID: [name] [name], female    DOB: 19-Jul-1933, 78 y.o.   MRN: [id number]  HPI Ambulation - She's increasing strength, doing some standing up exercise, and walking in her daughter's home without falls.  DIABETES MELLITUS - fastings 93 - 177 on Lantus 40. Only requiring mealtime insulin part of the time. No hypoglycemia.   Bronchitis - her cough responded well to the Benzontonate. Finishing the Levaquin.   Incontinence - significant and worse with cough. Would like to try more medication. Using Albuterol with spacer.   Review of Systems     Objective:   Physical Exam  Constitutional:       Generalized obesity   Cardiovascular: Normal rate and regular rhythm.   Pulmonary/Chest: Effort normal and breath sounds normal. She has no wheezes. She has no rales.  Musculoskeletal: She exhibits edema.       left 2+, right 1 +  Neurological: She is alert.  Psychiatric: She has a normal mood and affect.          Assessment & Plan:

## 2012-06-05 ENCOUNTER — Encounter: Payer: Self-pay | Admitting: Family Medicine

## 2012-06-05 NOTE — Assessment & Plan Note (Signed)
well controlled  

## 2012-06-05 NOTE — Assessment & Plan Note (Signed)
No improvement

## 2012-06-05 NOTE — Assessment & Plan Note (Signed)
Improved control. 

## 2012-06-05 NOTE — Assessment & Plan Note (Signed)
Improving with exercise and therapy

## 2012-06-05 NOTE — Assessment & Plan Note (Signed)
Will try increasing Vesicare to 2 tablets daily. Warned about anticholinergic side effects. Will increase to 10 mg tablets if good response. If not consider the new beta 3 medication.

## 2012-06-05 NOTE — Assessment & Plan Note (Signed)
No change 

## 2012-06-14 ENCOUNTER — Other Ambulatory Visit: Payer: Self-pay | Admitting: Family Medicine

## 2012-06-14 MED ORDER — ONETOUCH ULTRASOFT LANCETS MISC: Status: DC

## 2012-06-14 MED ORDER — GLUCOSE BLOOD VI STRP: ORAL_STRIP | Status: DC

## 2012-07-13 ENCOUNTER — Telehealth: Payer: Self-pay | Admitting: Family Medicine

## 2012-07-13 NOTE — Telephone Encounter (Signed)
Received fax that she was discharged from [facility] after obtaining "optimal level of health"

## 2012-07-18 ENCOUNTER — Ambulatory Visit: Payer: Medicare HMO | Admitting: Family Medicine

## 2012-07-23 ENCOUNTER — Encounter: Payer: Self-pay | Admitting: Family Medicine

## 2012-07-23 ENCOUNTER — Ambulatory Visit (INDEPENDENT_AMBULATORY_CARE_PROVIDER_SITE_OTHER): Payer: Medicare HMO | Admitting: Family Medicine

## 2012-07-23 VITALS — BP 115/67 | HR 61 | Temp 98.4°F | Ht 60.0 in | Wt 228.0 lb

## 2012-07-23 DIAGNOSIS — Z6841 Body Mass Index (BMI) 40.0 and over, adult: Secondary | ICD-10-CM

## 2012-07-23 DIAGNOSIS — E1149 Type 2 diabetes mellitus with other diabetic neurological complication: Secondary | ICD-10-CM

## 2012-07-23 DIAGNOSIS — J209 Acute bronchitis, unspecified: Secondary | ICD-10-CM

## 2012-07-23 DIAGNOSIS — D510 Vitamin B12 deficiency anemia due to intrinsic factor deficiency: Secondary | ICD-10-CM

## 2012-07-23 DIAGNOSIS — I10 Essential (primary) hypertension: Secondary | ICD-10-CM

## 2012-07-23 DIAGNOSIS — R32 Unspecified urinary incontinence: Secondary | ICD-10-CM

## 2012-07-23 DIAGNOSIS — E669 Obesity, unspecified: Secondary | ICD-10-CM

## 2012-07-23 LAB — IRON AND TIBC
%SAT: 17 % — ABNORMAL LOW (ref 20–55)
TIBC: 343 ug/dL (ref 250–470)
UIBC: 284 ug/dL (ref 125–400)

## 2012-07-23 LAB — POCT URINALYSIS DIPSTICK
Nitrite, UA: POSITIVE
Protein, UA: NEGATIVE
Urobilinogen, UA: 0.2
pH, UA: 6.5

## 2012-07-23 LAB — CBC
HCT: 37.5 % (ref 36.0–46.0)
Hemoglobin: 12.2 g/dL (ref 12.0–15.0)
MCV: 91.2 fL (ref 78.0–100.0)
WBC: 9.5 10*3/uL (ref 4.0–10.5)

## 2012-07-23 LAB — POCT UA - MICROSCOPIC ONLY

## 2012-07-23 LAB — FERRITIN: Ferritin: 48 ng/mL (ref 10–291)

## 2012-07-23 LAB — COMPREHENSIVE METABOLIC PANEL
ALT: 15 U/L (ref 0–35)
AST: 27 U/L (ref 0–37)
Alkaline Phosphatase: 78 U/L (ref 39–117)
BUN: 24 mg/dL — ABNORMAL HIGH (ref 6–23)
Creat: 1.58 mg/dL — ABNORMAL HIGH (ref 0.50–1.10)
Potassium: 4.6 mEq/L (ref 3.5–5.3)

## 2012-07-23 LAB — POCT GLYCOSYLATED HEMOGLOBIN (HGB A1C): Hemoglobin A1C: 8.4

## 2012-07-23 LAB — RETICULOCYTES: ABS Retic: 94.5 10*3/uL (ref 19.0–186.0)

## 2012-07-23 MED ORDER — CYANOCOBALAMIN 1000 MCG/ML IJ SOLN
1000.0000 ug | Freq: Once | INTRAMUSCULAR | Status: AC
  Administered 2012-07-23: 1000 ug via INTRAMUSCULAR

## 2012-07-23 MED ORDER — BENZONATATE 100 MG PO CAPS: 100.0000 mg | ORAL_CAPSULE | Freq: Three times a day (TID) | ORAL | Status: DC | PRN

## 2012-07-23 NOTE — Assessment & Plan Note (Signed)
Stable over the past year, but still in the morbid obesity range.

## 2012-07-23 NOTE — Assessment & Plan Note (Signed)
well controlled  

## 2012-07-23 NOTE — Assessment & Plan Note (Signed)
Despite her daughter's efforts her A1c is still above 8.

## 2012-07-23 NOTE — Assessment & Plan Note (Deleted)
Stable over the past year, but still in the morbid obesity range.  

## 2012-07-23 NOTE — Patient Instructions (Addendum)
Stop the Vesicare  Please return to see Dr Sheffield Slider in 1 month  I will contact you about the lab reports and we'll make

## 2012-07-23 NOTE — Assessment & Plan Note (Signed)
Vitamin B12 given. Since still anemic I checked CBC, retic count, iron and ferritin.

## 2012-07-23 NOTE — Progress Notes (Signed)
  Subjective:    Patient ID: [name] [name], female    DOB: 01-Aug-1933, 79 y.o.   MRN: [id number]  HPI Incontinence - The Vesicare hasn't helped. No burning on urination, but it is foul smelling. She is incontinent during the night while asleep. Daytime incontinence often comes without warning.   Cough - Continues, especially with lying. Benzontonate helped but she has to pay $15 for it. No dyspnea on exertion, but doesn't walk far. No increased edema. Is avoiding salt  Anemia - Due for B12 shot. No signs of bleeding in her stool.   DIABETES MELLITUS - fasting glucoses 107 - 132. No hypoglycemic symptoms. Only had to use a sliding scale for a couple days.   Memory - Often forgets to take her pills from the pill boxes her daughter fills.   Review of Systems     Objective:   Physical Exam  Constitutional: She is oriented to person, place, and time.       Generalized obesity   Cardiovascular: Normal rate and regular rhythm.   No murmur heard. Pulmonary/Chest: Effort normal and breath sounds normal. She has no wheezes. She has no rales.  Genitourinary:       Cath urine PVR 90 cc  Musculoskeletal: She exhibits edema.       2+ ankle edema  Neurological: She is alert and oriented to person, place, and time.  Psychiatric: She has a normal mood and affect. Her behavior is normal.          Assessment & Plan:

## 2012-07-23 NOTE — Assessment & Plan Note (Signed)
No bladder failure despite being on Vesicare and having spinal stenosis. Urine sent for culture.

## 2012-07-26 ENCOUNTER — Telehealth: Payer: Self-pay | Admitting: Family Medicine

## 2012-07-26 MED ORDER — SULFAMETHOXAZOLE-TRIMETHOPRIM 800-160 MG PO TABS: 1.0000 | ORAL_TABLET | Freq: Two times a day (BID) | ORAL | Status: AC

## 2012-07-26 NOTE — Telephone Encounter (Signed)
Daughter states patient is more sleepy than usual.  Blood Sugar higher than usual 300 and she has needed sliding scale insulin. Dr. Sheffield Slider notified and he will call daughter now.

## 2012-07-26 NOTE — Telephone Encounter (Signed)
I spoke with daughter, [name]. The jerking is like she had at the time of her last hospitalization for infection. Incontinence is the only symptom of UTI, though she has been complaining more of her low back. She is drowsy, but eating and drinking fluids well without nausea, vomiting, or fever.   Her urine is growing 10 K of E. Coli. Her creatinine is higher as is her Hgb so she could be getting dehydrated, though the WBC. I will treat with oral medications and instructed her daughter to bring her to the hospital if she's unable to take the medication.

## 2012-07-26 NOTE — Telephone Encounter (Signed)
Glucose was 352 yesterday

## 2012-07-26 NOTE — Telephone Encounter (Signed)
Called to say that her hands are jerking and dropping things, also wants to know what the lab results are - not sure what Dr Sheffield Slider wants to do.

## 2012-07-29 ENCOUNTER — Other Ambulatory Visit: Payer: Self-pay | Admitting: *Deleted

## 2012-07-29 MED ORDER — INSULIN ASPART 100 UNIT/ML ~~LOC~~ SOLN: 2.0000 [IU] | Freq: Three times a day (TID) | SUBCUTANEOUS | Status: DC | PRN

## 2012-08-12 ENCOUNTER — Other Ambulatory Visit: Payer: Self-pay | Admitting: Family Medicine

## 2012-08-12 DIAGNOSIS — B372 Candidiasis of skin and nail: Secondary | ICD-10-CM

## 2012-08-12 MED ORDER — NYSTATIN 100000 UNIT/GM EX POWD: Freq: Three times a day (TID) | CUTANEOUS | Status: DC

## 2012-08-20 ENCOUNTER — Ambulatory Visit: Payer: Medicare HMO | Admitting: Family Medicine

## 2012-08-27 ENCOUNTER — Encounter: Payer: Self-pay | Admitting: Family Medicine

## 2012-08-27 ENCOUNTER — Other Ambulatory Visit: Payer: Self-pay | Admitting: Family Medicine

## 2012-08-27 ENCOUNTER — Ambulatory Visit (INDEPENDENT_AMBULATORY_CARE_PROVIDER_SITE_OTHER): Payer: Medicare HMO | Admitting: Family Medicine

## 2012-08-27 VITALS — BP 146/76 | HR 66 | Temp 98.0°F | Ht 60.0 in | Wt 212.7 lb

## 2012-08-27 DIAGNOSIS — I10 Essential (primary) hypertension: Secondary | ICD-10-CM

## 2012-08-27 DIAGNOSIS — Z6841 Body Mass Index (BMI) 40.0 and over, adult: Secondary | ICD-10-CM

## 2012-08-27 DIAGNOSIS — R6889 Other general symptoms and signs: Secondary | ICD-10-CM

## 2012-08-27 DIAGNOSIS — G3184 Mild cognitive impairment, so stated: Secondary | ICD-10-CM

## 2012-08-27 DIAGNOSIS — J209 Acute bronchitis, unspecified: Secondary | ICD-10-CM

## 2012-08-27 DIAGNOSIS — D510 Vitamin B12 deficiency anemia due to intrinsic factor deficiency: Secondary | ICD-10-CM

## 2012-08-27 DIAGNOSIS — E1149 Type 2 diabetes mellitus with other diabetic neurological complication: Secondary | ICD-10-CM

## 2012-08-27 DIAGNOSIS — M48061 Spinal stenosis, lumbar region without neurogenic claudication: Secondary | ICD-10-CM

## 2012-08-27 HISTORY — DX: Mild cognitive impairment of uncertain or unknown etiology: G31.84

## 2012-08-27 MED ORDER — FLUTICASONE PROPIONATE 50 MCG/ACT NA SUSP: 2.0000 | Freq: Every day | NASAL | Status: DC

## 2012-08-27 MED ORDER — CYANOCOBALAMIN 1000 MCG/ML IJ SOLN
1000.0000 ug | Freq: Once | INTRAMUSCULAR | Status: AC
  Administered 2012-08-27: 1000 ug via INTRAMUSCULAR

## 2012-08-27 MED ORDER — DONEPEZIL HCL 5 MG PO TABS: 5.0000 mg | ORAL_TABLET | Freq: Every evening | ORAL | Status: DC | PRN

## 2012-08-27 MED ORDER — PREGABALIN 150 MG PO CAPS: 150.0000 mg | ORAL_CAPSULE | Freq: Every day | ORAL | Status: DC

## 2012-08-27 NOTE — Patient Instructions (Signed)
Take the Donepezil 5 mg on tablet at bedtime for 4 weeks then 2 tablets daily for the next 2 weeks.  Please return to see Dr Sheffield Slider in 6 weeks.

## 2012-08-28 ENCOUNTER — Telehealth: Payer: Self-pay | Admitting: *Deleted

## 2012-08-28 NOTE — Assessment & Plan Note (Signed)
Fair control. Reminded of importance of diet and increased exercise

## 2012-08-28 NOTE — Progress Notes (Signed)
  Subjective:    Patient ID: [name] [name], female    DOB: 08-Oct-1933, 79 y.o.   MRN: [id number]  HPI Weakness - persists. Occasionally does standing up exercises per her daughter  Diabetes mellitus - fasting capilllary blood glucose this AM was 184. No hypoglycemic episodes. Hasn't been following her diet per her daughter.   Incontinence Stress/urge persists despite vesicare. No change after treating the UTI.   Back discomfort - persists. Needs Lyrical refilled Review of Systems     Objective:   Physical Exam  Constitutional:  Generalized obesity   Pulmonary/Chest: Effort normal and breath sounds normal. She has no wheezes. She has no rales.  Musculoskeletal:  1+R Tr left ankles  Neurological: She is alert.  Psychiatric: She has a normal mood and affect. Her behavior is normal.          Assessment & Plan:

## 2012-08-28 NOTE — Assessment & Plan Note (Signed)
Worsening forgetfulness interfering with taking medications. Donepezil prescribed and subsequently received preauthorization form completed to fax back

## 2012-08-28 NOTE — Assessment & Plan Note (Signed)
Weight measure as significantly lower. Still has edema, so doubt accuracy.

## 2012-08-28 NOTE — Assessment & Plan Note (Signed)
BMET to check creatinine

## 2012-08-28 NOTE — Telephone Encounter (Signed)
PA required for  Donepezil. Form placed in MD box.

## 2012-08-28 NOTE — Assessment & Plan Note (Signed)
Vitamin B12 given.

## 2012-08-28 NOTE — Assessment & Plan Note (Signed)
Fair control.

## 2012-08-28 NOTE — Assessment & Plan Note (Signed)
Remains of narcotics. Will continue Lyrica

## 2012-09-04 ENCOUNTER — Telehealth: Payer: Self-pay | Admitting: Family Medicine

## 2012-09-04 ENCOUNTER — Other Ambulatory Visit: Payer: Self-pay | Admitting: Family Medicine

## 2012-09-04 MED ORDER — FLUTICASONE PROPIONATE 50 MCG/ACT NA SUSP: 2.0000 | Freq: Every day | NASAL | Status: DC

## 2012-09-04 MED ORDER — INSULIN PEN NEEDLE 31G X 5 MM MISC: 40.0000 [IU] | Freq: Two times a day (BID) | Status: DC

## 2012-09-04 NOTE — Telephone Encounter (Signed)
I completed the preauthorization and placed it in the fax pile. It may not be approved since forgetfulness is not a full dementia diagnosis  I resent the Flonase to [facility]. I had not received a request via fax from them for needles, but refilled it electronically.

## 2012-09-04 NOTE — Telephone Encounter (Signed)
Patients daughter is calling because the patient hasn't been able to get her Generic for Aricept due to the insurance needing Prior Auth and the form has been faxed to Dr. Sheffield Slider, also, a refill request was sent by the pharmacy for her needles and they never got the Rx for her Flonase.  They have been waiting for this for a week now and she needs this corrected today.

## 2012-09-06 ENCOUNTER — Encounter: Payer: Self-pay | Admitting: Family Medicine

## 2012-09-06 NOTE — Telephone Encounter (Signed)
I spoke with [name] [name] to let her know that [facility] didn't approve coverage of Donepezil, presumably because she doesn't qualify for a dementia diagnosis. Also let her know that I sent in the Flonase nasal spray again.

## 2012-09-20 ENCOUNTER — Telehealth: Payer: Self-pay | Admitting: Family Medicine

## 2012-09-20 DIAGNOSIS — E1149 Type 2 diabetes mellitus with other diabetic neurological complication: Secondary | ICD-10-CM

## 2012-09-20 NOTE — Telephone Encounter (Signed)
Form completed to fax back to [facility] for Accu-check Compact #102 for testing three times daily due to long and short acting insulin use with need for frequent adjustment. On 09/20/2012

## 2012-09-30 ENCOUNTER — Other Ambulatory Visit: Payer: Self-pay

## 2012-09-30 DIAGNOSIS — Z1231 Encounter for screening mammogram for malignant neoplasm of breast: Secondary | ICD-10-CM

## 2012-10-15 ENCOUNTER — Ambulatory Visit (INDEPENDENT_AMBULATORY_CARE_PROVIDER_SITE_OTHER): Payer: PRIVATE HEALTH INSURANCE | Admitting: Family Medicine

## 2012-10-15 ENCOUNTER — Encounter: Payer: Self-pay | Admitting: Family Medicine

## 2012-10-15 VITALS — BP 153/82 | HR 71 | Ht 60.0 in | Wt 233.0 lb

## 2012-10-15 DIAGNOSIS — M1A00X0 Idiopathic chronic gout, unspecified site, without tophus (tophi): Secondary | ICD-10-CM

## 2012-10-15 DIAGNOSIS — E785 Hyperlipidemia, unspecified: Secondary | ICD-10-CM

## 2012-10-15 DIAGNOSIS — R6889 Other general symptoms and signs: Secondary | ICD-10-CM

## 2012-10-15 DIAGNOSIS — B372 Candidiasis of skin and nail: Secondary | ICD-10-CM

## 2012-10-15 DIAGNOSIS — E1149 Type 2 diabetes mellitus with other diabetic neurological complication: Secondary | ICD-10-CM

## 2012-10-15 DIAGNOSIS — I10 Essential (primary) hypertension: Secondary | ICD-10-CM

## 2012-10-15 DIAGNOSIS — D510 Vitamin B12 deficiency anemia due to intrinsic factor deficiency: Secondary | ICD-10-CM

## 2012-10-15 LAB — CBC
Hemoglobin: 11.6 g/dL — ABNORMAL LOW (ref 12.0–15.0)
RBC: 3.85 MIL/uL — ABNORMAL LOW (ref 3.87–5.11)

## 2012-10-15 LAB — LDL CHOLESTEROL, DIRECT: Direct LDL: 139 mg/dL — ABNORMAL HIGH

## 2012-10-15 LAB — POCT GLYCOSYLATED HEMOGLOBIN (HGB A1C): Hemoglobin A1C: 9.3

## 2012-10-15 LAB — BASIC METABOLIC PANEL
Calcium: 8.6 mg/dL (ref 8.4–10.5)
Sodium: 139 mEq/L (ref 135–145)

## 2012-10-15 MED ORDER — NYSTATIN 100000 UNIT/GM EX POWD: Freq: Three times a day (TID) | CUTANEOUS | Status: DC

## 2012-10-15 MED ORDER — CYANOCOBALAMIN 1000 MCG/ML IJ SOLN
1000.0000 ug | Freq: Once | INTRAMUSCULAR | Status: AC
  Administered 2012-10-15: 1000 ug via INTRAMUSCULAR

## 2012-10-15 NOTE — Patient Instructions (Addendum)
Please return to see Dr Sheffield Slider in 2 weeks.  I will call if there are problems with your medicine tests. Increase your furosemide to 2 tabs daily and call me in one week with your weights.  Bring all of your medicines to your next visit.

## 2012-10-17 ENCOUNTER — Encounter: Payer: Self-pay | Admitting: Family Medicine

## 2012-10-17 NOTE — Assessment & Plan Note (Signed)
Vitamin B12 shot given.

## 2012-10-17 NOTE — Assessment & Plan Note (Signed)
Systolic elevated, will first try decreasing volume with increased furosemide.

## 2012-10-17 NOTE — Assessment & Plan Note (Signed)
No recent gout symptoms.  

## 2012-10-17 NOTE — Progress Notes (Signed)
  Subjective:    Patient ID: [name] [name], female    DOB: 1934/01/02, 79 y.o.   MRN: [id number]  HPI Diabetes - she hasn't been checking her blood sugars since moving back home. Taking Lantus 40 units daily. She recalls taking that dose twice daily in the past while in the NH, but I can't find that documented in the medication history.   Rash - recurrently gets rash under breasts and pannus that the cream helps.   Back living in her own home, after disagreement with daughter, [name], about selling land. Daughter, Maralyn Sago, lives nearby.   Legs are swelling more, but only her chronic level of dyspnea on exertion. Doesn't get much exercise. No chest pain  Anemia - due for vitamin B12 shot.    Review of Systems     Objective:   Physical Exam  Cardiovascular: Normal rate and regular rhythm.   Murmur heard. Pulmonary/Chest: Effort normal and breath sounds normal. She has no wheezes. She has no rales.  Musculoskeletal:  2+ ankle edema  Neurological: She is alert.  Psychiatric: She has a normal mood and affect. Her behavior is normal. Thought content normal.          Assessment & Plan:

## 2012-10-17 NOTE — Assessment & Plan Note (Addendum)
CMET OK, glucose was 375 and fasting capilllary blood glucose's have been above 200. I asked her to increase her Lantus by one unit daily until her fasting capilllary blood glucose is under 100 or she reaches 50 units.

## 2012-10-17 NOTE — Assessment & Plan Note (Signed)
She doesn't see memory as a problem currently

## 2012-10-29 ENCOUNTER — Encounter: Payer: Self-pay | Admitting: Family Medicine

## 2012-10-29 ENCOUNTER — Ambulatory Visit (INDEPENDENT_AMBULATORY_CARE_PROVIDER_SITE_OTHER): Payer: PRIVATE HEALTH INSURANCE | Admitting: Family Medicine

## 2012-10-29 VITALS — BP 103/60 | HR 62 | Ht 60.0 in | Wt 229.8 lb

## 2012-10-29 DIAGNOSIS — I10 Essential (primary) hypertension: Secondary | ICD-10-CM

## 2012-10-29 DIAGNOSIS — J069 Acute upper respiratory infection, unspecified: Secondary | ICD-10-CM | POA: Insufficient documentation

## 2012-10-29 MED ORDER — SIMVASTATIN 20 MG PO TABS: 20.0000 mg | ORAL_TABLET | Freq: Every day | ORAL | Status: DC

## 2012-10-29 MED ORDER — GUAIFENESIN-CODEINE 100-10 MG/5ML PO SOLN: 10.0000 mL | Freq: Four times a day (QID) | ORAL | Status: DC | PRN

## 2012-10-29 NOTE — Assessment & Plan Note (Signed)
blood pressure remains low normal

## 2012-10-29 NOTE — Assessment & Plan Note (Signed)
No sign of complication. Will prescribe cough suppressant

## 2012-10-29 NOTE — Progress Notes (Signed)
  Subjective:    Patient ID: [name] [name], female    DOB: 1934/03/01, 79 y.o.   MRN: [id number]  HPI Cough - for over a week after initial URI symptoms. OTC cough med isn't helping. Wheezes at times. No chest pain or shortness of breath  Diabetes mellitus - has been gradually increasing her Lantus and now on 48 units twice daily. Fasting capilllary blood glucoses around 150.   Edema - a little improved, but skin very fragile and scratches heal slowly  Review of Systems     Objective:   Physical Exam  Cardiovascular: Normal rate and regular rhythm.   Pulmonary/Chest: Effort normal and breath sounds normal. No respiratory distress. She has no wheezes. She has no rales.  Musculoskeletal: She exhibits edema.  Skin:  Skin ant lower legs is reddened and shiny with multiple scratches that don't appear infected.   Psychiatric: She has a normal mood and affect. Her behavior is normal.          Assessment & Plan:

## 2012-10-29 NOTE — Patient Instructions (Addendum)
Continue to take the furosemide 2 tablets daily.

## 2012-10-29 NOTE — Assessment & Plan Note (Signed)
Weight down 3 lbs, but still has ankle edema

## 2012-10-31 ENCOUNTER — Ambulatory Visit
Admission: RE | Admit: 2012-10-31 | Discharge: 2012-10-31 | Disposition: A | Discharge status: Home / Self Care | Payer: PRIVATE HEALTH INSURANCE | Source: Ambulatory Visit

## 2012-10-31 DIAGNOSIS — Z1231 Encounter for screening mammogram for malignant neoplasm of breast: Secondary | ICD-10-CM

## 2012-11-09 ENCOUNTER — Other Ambulatory Visit: Payer: Self-pay | Admitting: Family Medicine

## 2012-11-14 ENCOUNTER — Ambulatory Visit (INDEPENDENT_AMBULATORY_CARE_PROVIDER_SITE_OTHER): Payer: PRIVATE HEALTH INSURANCE | Admitting: Family Medicine

## 2012-11-14 ENCOUNTER — Encounter: Payer: Self-pay | Admitting: Family Medicine

## 2012-11-14 VITALS — BP 137/79 | HR 71 | Temp 98.5°F | Ht 60.0 in | Wt 228.8 lb

## 2012-11-14 DIAGNOSIS — D510 Vitamin B12 deficiency anemia due to intrinsic factor deficiency: Secondary | ICD-10-CM

## 2012-11-14 DIAGNOSIS — M1A00X0 Idiopathic chronic gout, unspecified site, without tophus (tophi): Secondary | ICD-10-CM

## 2012-11-14 DIAGNOSIS — J069 Acute upper respiratory infection, unspecified: Secondary | ICD-10-CM

## 2012-11-14 DIAGNOSIS — M1710 Unilateral primary osteoarthritis, unspecified knee: Secondary | ICD-10-CM

## 2012-11-14 DIAGNOSIS — B372 Candidiasis of skin and nail: Secondary | ICD-10-CM

## 2012-11-14 DIAGNOSIS — E559 Vitamin D deficiency, unspecified: Secondary | ICD-10-CM

## 2012-11-14 DIAGNOSIS — F329 Major depressive disorder, single episode, unspecified: Secondary | ICD-10-CM

## 2012-11-14 DIAGNOSIS — I10 Essential (primary) hypertension: Secondary | ICD-10-CM

## 2012-11-14 LAB — BASIC METABOLIC PANEL
Potassium: 4.7 mEq/L (ref 3.5–5.3)
Sodium: 139 mEq/L (ref 135–145)

## 2012-11-14 LAB — URIC ACID: Uric Acid, Serum: 8.1 mg/dL — ABNORMAL HIGH (ref 2.4–7.0)

## 2012-11-14 MED ORDER — GUAIFENESIN-CODEINE 100-10 MG/5ML PO SOLN: 10.0000 mL | Freq: Four times a day (QID) | ORAL | Status: DC | PRN

## 2012-11-14 MED ORDER — FUROSEMIDE 40 MG PO TABS: ORAL_TABLET | ORAL | Status: DC

## 2012-11-14 MED ORDER — VALSARTAN 80 MG PO TABS: 40.0000 mg | ORAL_TABLET | Freq: Every day | ORAL | Status: DC

## 2012-11-14 MED ORDER — VALSARTAN 80 MG PO TABS: 80.0000 mg | ORAL_TABLET | Freq: Every day | ORAL | Status: DC

## 2012-11-14 MED ORDER — NYSTATIN 100000 UNIT/GM EX POWD: Freq: Three times a day (TID) | CUTANEOUS | Status: DC

## 2012-11-14 MED ORDER — CYANOCOBALAMIN 1000 MCG/ML IJ SOLN
1000.0000 ug | Freq: Once | INTRAMUSCULAR | Status: AC
  Administered 2012-11-14: 1000 ug via INTRAMUSCULAR

## 2012-11-14 MED ORDER — METFORMIN HCL ER (MOD) 500 MG PO TB24: ORAL_TABLET | ORAL | Status: DC

## 2012-11-14 NOTE — Assessment & Plan Note (Signed)
Last creatinine 1.41, will re-check given increasing Lasix, and also to assess if metformin appropriate in this patient.

## 2012-11-14 NOTE — Assessment & Plan Note (Addendum)
Checking uric acid given increase of lasix, continue allopurinol.

## 2012-11-14 NOTE — Assessment & Plan Note (Addendum)
Mildly elevated and not on ACEi/ARB for renal protection given DM.  Allergy to ACE, will try on Valsartan 80 mg po daily

## 2012-11-14 NOTE — Assessment & Plan Note (Signed)
PHQ 2 negative today, but is anxious about her daughter's biopsy results.

## 2012-11-14 NOTE — Progress Notes (Signed)
  Subjective:    Patient ID: [name] [name], female    DOB: 07-11-1933, 79 y.o.   MRN: [id number]  HPI:  [facility]   DM: Patient could not get lantus filled (she has it now(, so started taking metformin of her daughter.(d/c'd a few months ago due to renal insufficiency.  Patient is checking blood sugars.  Fasting sugars range from 170 to 200.  No hyper or hypoglycemic episodes, no polyuria or polydipsia.  She now has the lantus at home again, but has not re-stared it because sugars have been better on only metformin.  Last A1c =9.3, last Creatinine= 1.41, both in April. She hasn't been taking mealtime insulin since she left her daughter, Carolyn's house.   LE edema: worse, weight only down 1 lb with increased lasix last visit- now taking 80 mg PO daily.   Gout: Taking allopurinol, currently asymptomatic despite recent increase in Lasix.   Cough: continues to have cough, but no fever, it is minimally productive. Never got the prescription filled for Robitussin with Codeine that she was given last visit.   Irritability - daughter asks that she be given a pill for this. There is stress due to daughter, Rosetta Posner, being in the hospital having an esophageal biopsy after history of esophageal dilatations. Mrs Newbrough denies chronic depression symptoms.   She'd like to get her Vitamin B12 shot today.   She hasn't used CPAP for years and her daughter hasn't noted night time snoring or apnea.   She needs a larger prescription for the Mycostatin powder for recurrent rash.    Past Medical History  Diagnosis Date  . History of cystoscopy 08/2005    normal  . Asthma   . Hypertension   . Diabetes mellitus without complication   . Neuropathy     History  Substance Use Topics  . Smoking status: Passive Smoke Exposure - Never Smoker  . Smokeless tobacco: Not on file  . Alcohol Use: No    Family History  Problem Relation Age of Onset  . Alcohol  abuse Son   . Alcohol abuse Daughter     died  . Diabetes Mother   . Diabetes Daughter   . Diabetes Sister   . Coronary artery disease Sister     MI  . Alzheimer's disease Sister     died of heart proble  . Diabetes Daughter   . Diabetes Daughter   . Diabetes Son   . Diabetes Son      ROS Pertinent items in HPI    Objective:  Physical Exam:  BP 137/79  Pulse 71  Temp(Src) 98.5 F (36.9 C) (Oral)  Ht 5' (1.524 m)  Wt 228 lb 12.8 oz (103.783 kg)  BMI 44.68 kg/m2  SpO2 92% General appearance: alert, cooperative and no distress, morbidly obese Head: Normocephalic, without obvious abnormality, atraumatic Lungs: clear to auscultation bilaterally, no crackles, wheezes, rales Heart: regular rate and rhythm, distant heart sounds Extremities: Chronic venous stasis changes of LE, 2-3+ pitting edema of ankles, 2+ up shins to knee       Assessment & Plan:

## 2012-11-14 NOTE — Assessment & Plan Note (Signed)
Patient with poor compliance, not bringing blood sugar logs.  Will re-check creatinine to see if she can take metformin, and then call her with instructions for Lantus dose.  Gave blood sugar log book and asked to keep log, bring to next visit.

## 2012-11-14 NOTE — Assessment & Plan Note (Signed)
No signs of bacterial infection, rx for cough syrup.

## 2012-11-14 NOTE — Patient Instructions (Addendum)
Please return to see Dr Sheffield Slider in 1 month  Increase your fuosemide to 40 mg 2 tablets in the AM and one in the PM  You are starting a new medicine, Valsartan, for your blood pressure and to protect your kidneys.   I will call you with your lab test results and we'll decide about the insulin treatment.  Get the cough syrup with codeine

## 2012-11-14 NOTE — Assessment & Plan Note (Signed)
I wrote a Rx for the type of cane that stays standing, but informed them that her insurance may not cover it.

## 2012-11-14 NOTE — Assessment & Plan Note (Signed)
Continue Nystatin as needed

## 2012-11-15 ENCOUNTER — Other Ambulatory Visit: Payer: Self-pay | Admitting: Family Medicine

## 2012-11-15 ENCOUNTER — Encounter: Payer: Self-pay | Admitting: Family Medicine

## 2012-11-15 LAB — VITAMIN D 25 HYDROXY (VIT D DEFICIENCY, FRACTURES): Vit D, 25-Hydroxy: 39 ng/mL (ref 30–89)

## 2012-11-15 MED ORDER — ALLOPURINOL 300 MG PO TABS: 300.0000 mg | ORAL_TABLET | Freq: Every day | ORAL | Status: DC

## 2012-11-15 NOTE — Telephone Encounter (Signed)
Uric acid level high on 200 mg daily so dose increased

## 2012-11-22 ENCOUNTER — Telehealth: Payer: Self-pay | Admitting: Family Medicine

## 2012-11-22 NOTE — Telephone Encounter (Signed)
Pt has been having tremors in her hands since 4 days ago and isn't sure if it coming from one of her meds.

## 2012-11-22 NOTE — Telephone Encounter (Signed)
She reports a postural tremor and occasionally seeing something pass before her eyes, but no confusion. Only new medications are Tramadol and Diovan. No interactions between those, but could have serotonin symptoms with combination of Tramadol and Citalopram, however her doses are low and last tramadol was last evening. Fasting capilllary blood glucose was over 200 and she hasn't used Albuterol today.   I recommended she take plain acetominophen 500 mg 2 tabs twice daily for pain if needed, stop the Tramadol and call Monday with how she is doing to decide other pain medication alternatives. To call for office visit if she worsens.

## 2012-11-22 NOTE — Telephone Encounter (Signed)
Pt reports tremors for the past 4 days - all day , nothing makes it better or worse. States that it started metformin and diovan prescribed on the last office visit. Please advise.  Wyatt Haste, RN-BSN

## 2012-11-25 ENCOUNTER — Telehealth: Payer: Self-pay | Admitting: *Deleted

## 2012-11-25 NOTE — Telephone Encounter (Signed)
Pt called to check on progress. She states that tremors have stopped and she is feeling better after stopping Tramadol. Encouraged to call office if pain increases or tremors start again. Pt verbalizes understanding. Wyatt Haste, RN-BSN

## 2012-11-25 NOTE — Telephone Encounter (Signed)
Noted, may have been mild serotonin syndrome since also taking citalopram. Entered Tramadol as a medication intolerance

## 2012-12-04 ENCOUNTER — Telehealth: Payer: Self-pay | Admitting: Family Medicine

## 2012-12-04 NOTE — Telephone Encounter (Signed)
Will forward to Dr Hale 

## 2012-12-04 NOTE — Telephone Encounter (Signed)
Patient states that Lasix dosage was suppose to be changed, and has not been. Wants the change to be called in to [facility] on [address].

## 2012-12-06 MED ORDER — FUROSEMIDE 40 MG PO TABS: ORAL_TABLET | ORAL | Status: DC

## 2012-12-06 NOTE — Telephone Encounter (Signed)
I left a message on her machine. I resent the furosemide for 90 tab;ets since she is taking 3 daily.

## 2012-12-17 ENCOUNTER — Ambulatory Visit (INDEPENDENT_AMBULATORY_CARE_PROVIDER_SITE_OTHER): Payer: PRIVATE HEALTH INSURANCE | Admitting: Family Medicine

## 2012-12-17 ENCOUNTER — Encounter: Payer: Self-pay | Admitting: Family Medicine

## 2012-12-17 VITALS — BP 126/56 | HR 63 | Temp 98.2°F | Ht 60.0 in | Wt 224.0 lb

## 2012-12-17 DIAGNOSIS — E1149 Type 2 diabetes mellitus with other diabetic neurological complication: Secondary | ICD-10-CM

## 2012-12-17 DIAGNOSIS — J441 Chronic obstructive pulmonary disease with (acute) exacerbation: Secondary | ICD-10-CM

## 2012-12-17 DIAGNOSIS — I10 Essential (primary) hypertension: Secondary | ICD-10-CM

## 2012-12-17 DIAGNOSIS — B372 Candidiasis of skin and nail: Secondary | ICD-10-CM

## 2012-12-17 DIAGNOSIS — D510 Vitamin B12 deficiency anemia due to intrinsic factor deficiency: Secondary | ICD-10-CM

## 2012-12-17 MED ORDER — NYSTATIN 100000 UNIT/GM EX POWD: Freq: Three times a day (TID) | CUTANEOUS | Status: DC

## 2012-12-17 MED ORDER — CYANOCOBALAMIN 1000 MCG/ML IJ SOLN
1000.0000 ug | Freq: Once | INTRAMUSCULAR | Status: AC
  Administered 2012-12-17: 1000 ug via INTRAMUSCULAR

## 2012-12-17 MED ORDER — INSULIN PEN NEEDLE 31G X 5 MM MISC: 50.0000 [IU] | Freq: Two times a day (BID) | Status: DC

## 2012-12-17 MED ORDER — GUAIFENESIN-CODEINE 100-10 MG/5ML PO SOLN: 10.0000 mL | Freq: Four times a day (QID) | ORAL | Status: DC | PRN

## 2012-12-17 MED ORDER — INSULIN GLARGINE 100 UNIT/ML SOLOSTAR PEN: 50.0000 [IU] | PEN_INJECTOR | Freq: Two times a day (BID) | SUBCUTANEOUS | Status: DC

## 2012-12-17 NOTE — Patient Instructions (Addendum)
Check with your pharmacy and see if the Valsartan prescription is there. If not and you don't have it at home call Dr Sheffield Slider to send it in.   Please return to see Dr Sheffield Slider in 3months.  I will be retiring in the summer of this year. Thank you for allowing me to work with you in maintaining your health. Donnella Sham, MD will be my replacement beginning the second week in August. He is moving to [place] having completed his family medicine residency training in [place], [place]. Please let me know if you would like to be assigned to a different physician.

## 2012-12-18 NOTE — Assessment & Plan Note (Addendum)
well controlled  She doesn't remember getting Valsartan and didn't bring it with her today. They will check to see if it is at the pharmacy and let me know. If it hasn't been started, it might be better to use Losartan instead which could help lower her uric acid.

## 2012-12-18 NOTE — Assessment & Plan Note (Addendum)
Apparent improvement in her diabetes control.  Will check A1c next visit. Would like to have her on an ARB for renal protection since ACE intolerant.

## 2012-12-18 NOTE — Assessment & Plan Note (Signed)
Nystatin refills for when it recurs.

## 2012-12-18 NOTE — Progress Notes (Signed)
  Subjective:    Patient ID: [name] [name], female    DOB: [birth year], 79 y.o.   MRN: [id number]  Hypertension  Cough   Cough - A couple weeks ago her daughter, Sera, was hospitalized for treatment of bronchitis. [name] developed URI symptoms and cough around that time and the cough persists. No fever or shortness of breath. She requests more cough syrup. She last used her albuterol neb around lunch time.   Fall - she slipped trying to sit down on her seated walker and tore the skin of her left elbow. Daughter applied Tegaderm dressing. She hasn't seen signs of infection.   Diabetes mellitus - Accuchecks fasting have been 132 and 151. She's taking the Lantus 50 units twice daily without hypoglycemia symptoms. Not taking coverage insulin at mealtimes.   Rash - she requests Nystatin powder for recurrent rash under her breasts.   Review of Systems  Respiratory: Positive for cough.        Objective:   Physical Exam  Cardiovascular: Normal rate and regular rhythm.   No murmur heard. Pulmonary/Chest: Effort normal and breath sounds normal. She has no wheezes. She has no rales.  Musculoskeletal: She exhibits edema.  2+ edema of ankles.  Neurological: She is alert.  Psychiatric: She has a normal mood and affect.          Assessment & Plan:

## 2012-12-18 NOTE — Assessment & Plan Note (Addendum)
Mild with associated URI Cough medicine refilled.

## 2013-01-02 ENCOUNTER — Other Ambulatory Visit: Payer: Self-pay

## 2013-01-09 ENCOUNTER — Telehealth: Payer: Self-pay | Admitting: Family Medicine

## 2013-01-09 MED ORDER — TRAZODONE HCL 50 MG PO TABS: 25.0000 mg | ORAL_TABLET | Freq: Every evening | ORAL | Status: DC | PRN

## 2013-01-09 NOTE — Telephone Encounter (Signed)
error 

## 2013-01-09 NOTE — Telephone Encounter (Signed)
Pt reports " My daughter died last week and my nerves are just tore up - can he please call me something in for my nerves just for a month to help me" - Please advise Wyatt Haste, RN-BSN

## 2013-01-09 NOTE — Telephone Encounter (Signed)
I called [name] [name] who says her alcoholic daughter, [name], died last week of cirrhosis. She reports anxiety related to this and requests something like "Valium" I told her of the fall risk of this type medication but will send in Trazodone to help with sleep.

## 2013-01-16 ENCOUNTER — Telehealth: Payer: Self-pay | Admitting: Family Medicine

## 2013-01-16 NOTE — Telephone Encounter (Signed)
Pt is requesting different medication . Dr. Sheffield Slider prescribed Trazodone and it is not helping. JW

## 2013-01-16 NOTE — Telephone Encounter (Signed)
Will fwd to MD for advice.  [name] [name] L, CMA  

## 2013-01-17 NOTE — Telephone Encounter (Signed)
Please let her know that I have no other sleeping medication options that won't make her fall.

## 2013-03-28 ENCOUNTER — Encounter: Payer: Self-pay | Admitting: Family Medicine

## 2013-03-28 NOTE — Progress Notes (Signed)
Patient ID: [name] [name], female   DOB: 07-17-1933, 79 y.o.   MRN: [id number] I have received a request for knee brace for [name], I have never seen her for knee pain (and there is not a documented history of knee pain/arthritis)  Please contact patient to make an appointment so that I can evaluate her knee and complete the paperwork correctly, thanks

## 2013-04-05 ENCOUNTER — Other Ambulatory Visit: Payer: Self-pay | Admitting: Family Medicine

## 2013-04-11 ENCOUNTER — Telehealth: Payer: Self-pay | Admitting: Family Medicine

## 2013-04-11 NOTE — Telephone Encounter (Signed)
Called pt. Phone was not working. Unable to communicate. Waiting for call back. [name] [name], [name]

## 2013-04-11 NOTE — Telephone Encounter (Signed)
I have received a request for a knee brace for [name] [name], I need to evaluated her knee pain in office prior to completing the paperwork.   Please call her to set up an appointment. Thanks.

## 2013-04-14 NOTE — Telephone Encounter (Signed)
Called and spoke with patient, I told her she will need an office visit to be evaluated for a knee brace. Ms [name] [name] will be seen by Dr Randolm Idol on 04/22/2013 at 11:00.Busick, [name]

## 2013-04-22 ENCOUNTER — Ambulatory Visit: Payer: PRIVATE HEALTH INSURANCE | Admitting: Family Medicine

## 2013-04-25 ENCOUNTER — Encounter: Payer: Self-pay | Admitting: Cardiovascular Disease

## 2013-04-25 ENCOUNTER — Encounter: Payer: Self-pay | Admitting: Gastroenterology

## 2013-04-28 ENCOUNTER — Encounter: Payer: Self-pay | Admitting: Family Medicine

## 2013-04-28 ENCOUNTER — Ambulatory Visit (INDEPENDENT_AMBULATORY_CARE_PROVIDER_SITE_OTHER): Payer: PRIVATE HEALTH INSURANCE | Admitting: Family Medicine

## 2013-04-28 VITALS — BP 147/73 | HR 65 | Temp 98.2°F | Wt 230.0 lb

## 2013-04-28 DIAGNOSIS — S81801A Unspecified open wound, right lower leg, initial encounter: Secondary | ICD-10-CM | POA: Insufficient documentation

## 2013-04-28 DIAGNOSIS — D510 Vitamin B12 deficiency anemia due to intrinsic factor deficiency: Secondary | ICD-10-CM

## 2013-04-28 DIAGNOSIS — Z23 Encounter for immunization: Secondary | ICD-10-CM

## 2013-04-28 DIAGNOSIS — Z96652 Presence of left artificial knee joint: Secondary | ICD-10-CM

## 2013-04-28 DIAGNOSIS — Z96659 Presence of unspecified artificial knee joint: Secondary | ICD-10-CM

## 2013-04-28 DIAGNOSIS — M6530 Trigger finger, unspecified finger: Secondary | ICD-10-CM

## 2013-04-28 DIAGNOSIS — S81009A Unspecified open wound, unspecified knee, initial encounter: Secondary | ICD-10-CM

## 2013-04-28 MED ORDER — CYANOCOBALAMIN 1000 MCG/ML IJ SOLN
1000.0000 ug | Freq: Once | INTRAMUSCULAR | Status: AC
  Administered 2013-04-28: 1000 ug via INTRAMUSCULAR

## 2013-04-28 NOTE — Assessment & Plan Note (Signed)
Patient has pain of the left knee with history of total knee replacement approximately 2 years ago -Will check x-ray of the left knee to rule out loosening of hardware -Conservative management including ice, elevation, compression discussed with the patient -As she is unclear what medications she has at home for pain she was counseled to call the office to update her medication list -Per office medication list she is on Ultracet which could be used to control her pain as needed

## 2013-04-28 NOTE — Assessment & Plan Note (Signed)
1 cm x 1 cm superficial wound over the right lateral leg secondary to trauma, no acute evidence of infection -Patient was counseled to continue local wound care

## 2013-04-28 NOTE — Patient Instructions (Signed)
Left knee pain - have xray done today to check for loosening of the replacement, ice the left knee 2-3 times per day for 15 minutes at a time, call the office to let Dr. Randolm Idol know if you have been taking Ultracet (combination of Tylenol and Tramadol)  Right ring finger - you have a trigger finger  Cut on leg - not infected, continue local wound care  Trigger Finger Trigger finger (digital tendinitis and stenosing tenosynovitis) is a common disorder that causes an often painful catching of the fingers or thumb. It occurs as a clicking, snapping or locking of a finger in the palm of the hand. The reason for this is that there is a problem with the tendons which flex the fingers sliding smoothly through their sheaths. The cause of this may be inflammation of the tendon and sheath, or from a thickening or nodule in the tendon. The condition may occur in any finger or a couple fingers at the same time. The cause may be overuse while doing the same activity over and over again with your hands.  Tendons are the tough cords that connect the muscles to bones. Muscles and tendons are part of the system which allows your body to move. When muscles contract in the forearm on the palm side, they pull the tendons toward the elbow and cause the fingers and thumb to bend (flex) toward the palm. These are the flexor tendons. The tendons slide through a slippery smooth membrane (synovium) which is called the tendon sheath. The sheaths have areas of tough fibrous tissues surrounding them which hold the tendons close to the bone. These are called pulleys because they work like a pulley. The first pulley is in the palm of the hand near the crease which runs across your palm. If the area of the tendon thickening is near the pulley, the tendon cannot slide smoothly through the pulley and this causes the trigger finger. The finger may lock with the finger curled or suddenly straighten out with a snap. This is more common in  patients with rheumatoid arthritis and diabetes. Left untreated, the condition may get worse to the point where the finger becomes locked in flexion, like making a fist, or less commonly locked with the finger straightened out. DIAGNOSIS  Your caregiver will easily make this diagnosis on examination. TREATMENT   Splinting for 6 to 8 weeks of time may be helpful. Use the splints as your caregiver suggests.  Heat used for twenty minutes at least four times a day followed by ice packs for twenty minutes unless directed otherwise by your caregiver may be helpful. If you find either heat or cold seems to be making the problem worse, quit using them and ask your caregiver for directions.  Cortisone injections along with splinting may speed up recovery. Several injections may be required. Cortisone may give relief after one injection.  Only take over-the-counter or prescription medicines for pain, discomfort, or fever as directed by your caregiver.  Surgery is another treatment that may be used if conservative treatments using injection and splinting does not work. Surgery can be minor without incisions (a cut does not have to be made) and can be done with a needle through the skin. No stitches are needed and most patients may return to work the same day.  Other surgical choices involve an open procedure where the surgeon opens the hand through a small incision (cut) and cuts the pulley so the tendon can again slide smoothly. Your hand  will still work fine. This small operation requires stitches and the recovery will be a little longer and the incisions will need to be protected until completely healed. You may have to limit your activities for up to 6 months.  Occupational or hand therapy may be required if there is stiffness remaining in the finger. RISKS AND COMPLICATIONS Complications are uncommon but some problems that may occur are:  Recurrence of the trigger finger. This does not mean that the  surgery was not well done. It simply means that you may have formed scar tissue following surgery that causes the problem to reoccur.  Infection which could ruin the results of the surgery and can result in a finger which is frozen and can not move normally.  Nerve injury is possible which could result in permanent numbness of one or more fingers. CARE AFTER SURGERY  Elevate your hand above your heart and use ice as instructed.  Follow instructions regarding finger motion/exercise.  Keep the surgical wound dry for at least 48 hrs or longer if instructed.  Keep your follow-up appointments.  Return to work and normal activities as instructed. SEEK IMMEDIATE MEDICAL CARE IF:  Your problems are getting worse or you do not obtain relief from the treatment. Document Released: 04/01/2004 Document Revised: 09/04/2011 Document Reviewed: 11/24/2008 [facility] Patient Information 2014 [place], [place]. Trigger Finger Trigger finger (digital tendinitis and stenosing tenosynovitis) is a common disorder that causes an often painful catching of the fingers or thumb. It occurs as a clicking, snapping or locking of a finger in the palm of the hand. The reason for this is that there is a problem with the tendons which flex the fingers sliding smoothly through their sheaths. The cause of this may be inflammation of the tendon and sheath, or from a thickening or nodule in the tendon. The condition may occur in any finger or a couple fingers at the same time. The cause may be overuse while doing the same activity over and over again with your hands.  Tendons are the tough cords that connect the muscles to bones. Muscles and tendons are part of the system which allows your body to move. When muscles contract in the forearm on the palm side, they pull the tendons toward the elbow and cause the fingers and thumb to bend (flex) toward the palm. These are the flexor tendons. The tendons slide through a slippery smooth  membrane (synovium) which is called the tendon sheath. The sheaths have areas of tough fibrous tissues surrounding them which hold the tendons close to the bone. These are called pulleys because they work like a pulley. The first pulley is in the palm of the hand near the crease which runs across your palm. If the area of the tendon thickening is near the pulley, the tendon cannot slide smoothly through the pulley and this causes the trigger finger. The finger may lock with the finger curled or suddenly straighten out with a snap. This is more common in patients with rheumatoid arthritis and diabetes. Left untreated, the condition may get worse to the point where the finger becomes locked in flexion, like making a fist, or less commonly locked with the finger straightened out. DIAGNOSIS  Your caregiver will easily make this diagnosis on examination. TREATMENT   Splinting for 6 to 8 weeks of time may be helpful. Use the splints as your caregiver suggests.  Heat used for twenty minutes at least four times a day followed by ice packs for twenty minutes  unless directed otherwise by your caregiver may be helpful. If you find either heat or cold seems to be making the problem worse, quit using them and ask your caregiver for directions.  Cortisone injections along with splinting may speed up recovery. Several injections may be required. Cortisone may give relief after one injection.  Only take over-the-counter or prescription medicines for pain, discomfort, or fever as directed by your caregiver.  Surgery is another treatment that may be used if conservative treatments using injection and splinting does not work. Surgery can be minor without incisions (a cut does not have to be made) and can be done with a needle through the skin. No stitches are needed and most patients may return to work the same day.  Other surgical choices involve an open procedure where the surgeon opens the hand through a small  incision (cut) and cuts the pulley so the tendon can again slide smoothly. Your hand will still work fine. This small operation requires stitches and the recovery will be a little longer and the incisions will need to be protected until completely healed. You may have to limit your activities for up to 6 months.  Occupational or hand therapy may be required if there is stiffness remaining in the finger. RISKS AND COMPLICATIONS Complications are uncommon but some problems that may occur are:  Recurrence of the trigger finger. This does not mean that the surgery was not well done. It simply means that you may have formed scar tissue following surgery that causes the problem to reoccur.  Infection which could ruin the results of the surgery and can result in a finger which is frozen and can not move normally.  Nerve injury is possible which could result in permanent numbness of one or more fingers. CARE AFTER SURGERY  Elevate your hand above your heart and use ice as instructed.  Follow instructions regarding finger motion/exercise.  Keep the surgical wound dry for at least 48 hrs or longer if instructed.  Keep your follow-up appointments.  Return to work and normal activities as instructed. SEEK IMMEDIATE MEDICAL CARE IF:  Your problems are getting worse or you do not obtain relief from the treatment. Document Released: 04/01/2004 Document Revised: 09/04/2011 Document Reviewed: 11/24/2008 [facility] Patient Information 2014 [place], [place]. Trigger Finger Trigger finger (digital tendinitis and stenosing tenosynovitis) is a common disorder that causes an often painful catching of the fingers or thumb. It occurs as a clicking, snapping or locking of a finger in the palm of the hand. The reason for this is that there is a problem with the tendons which flex the fingers sliding smoothly through their sheaths. The cause of this may be inflammation of the tendon and sheath, or from a thickening or  nodule in the tendon. The condition may occur in any finger or a couple fingers at the same time. The cause may be overuse while doing the same activity over and over again with your hands.  Tendons are the tough cords that connect the muscles to bones. Muscles and tendons are part of the system which allows your body to move. When muscles contract in the forearm on the palm side, they pull the tendons toward the elbow and cause the fingers and thumb to bend (flex) toward the palm. These are the flexor tendons. The tendons slide through a slippery smooth membrane (synovium) which is called the tendon sheath. The sheaths have areas of tough fibrous tissues surrounding them which hold the tendons close to the bone.  These are called pulleys because they work like a pulley. The first pulley is in the palm of the hand near the crease which runs across your palm. If the area of the tendon thickening is near the pulley, the tendon cannot slide smoothly through the pulley and this causes the trigger finger. The finger may lock with the finger curled or suddenly straighten out with a snap. This is more common in patients with rheumatoid arthritis and diabetes. Left untreated, the condition may get worse to the point where the finger becomes locked in flexion, like making a fist, or less commonly locked with the finger straightened out. DIAGNOSIS  Your caregiver will easily make this diagnosis on examination. TREATMENT   Splinting for 6 to 8 weeks of time may be helpful. Use the splints as your caregiver suggests.  Heat used for twenty minutes at least four times a day followed by ice packs for twenty minutes unless directed otherwise by your caregiver may be helpful. If you find either heat or cold seems to be making the problem worse, quit using them and ask your caregiver for directions.  Cortisone injections along with splinting may speed up recovery. Several injections may be required. Cortisone may give  relief after one injection.  Only take over-the-counter or prescription medicines for pain, discomfort, or fever as directed by your caregiver.  Surgery is another treatment that may be used if conservative treatments using injection and splinting does not work. Surgery can be minor without incisions (a cut does not have to be made) and can be done with a needle through the skin. No stitches are needed and most patients may return to work the same day.  Other surgical choices involve an open procedure where the surgeon opens the hand through a small incision (cut) and cuts the pulley so the tendon can again slide smoothly. Your hand will still work fine. This small operation requires stitches and the recovery will be a little longer and the incisions will need to be protected until completely healed. You may have to limit your activities for up to 6 months.  Occupational or hand therapy may be required if there is stiffness remaining in the finger. RISKS AND COMPLICATIONS Complications are uncommon but some problems that may occur are:  Recurrence of the trigger finger. This does not mean that the surgery was not well done. It simply means that you may have formed scar tissue following surgery that causes the problem to reoccur.  Infection which could ruin the results of the surgery and can result in a finger which is frozen and can not move normally.  Nerve injury is possible which could result in permanent numbness of one or more fingers. CARE AFTER SURGERY  Elevate your hand above your heart and use ice as instructed.  Follow instructions regarding finger motion/exercise.  Keep the surgical wound dry for at least 48 hrs or longer if instructed.  Keep your follow-up appointments.  Return to work and normal activities as instructed. SEEK IMMEDIATE MEDICAL CARE IF:  Your problems are getting worse or you do not obtain relief from the treatment. Document Released: 04/01/2004 Document  Revised: 09/04/2011 Document Reviewed: 11/24/2008 [facility] Patient Information 2014 [place], [place].

## 2013-04-28 NOTE — Assessment & Plan Note (Signed)
Intermittent triggering of the right ring finger without significant pain or impact on her quality of life -Will pursue conservative management with ice to the affected area as needed -Discussed with patient that triggering worsens could consider local injection

## 2013-04-28 NOTE — Progress Notes (Signed)
  Subjective:    Patient ID: [name] [name], female    DOB: 01-10-1934, 79 y.o.   MRN: [id number]  HPI 79 year old female presents for evaluation of multiple medical problems.  Left knee pain-patient has history of pulmonary placement of the left knee, this occurred approximately 2 years ago however she has not followed up regularly with her orthopedic surgeon, she states the pain is primarily over the patellar area as well as superior aspect of the tibia, she reports some swelling, no redness of the area, she uses a walker to help with ambulation, pain is worse after she has been sitting for a while, improved with ambulation and movement of the lower extremity, she has not been applying ice and/or heat to the area, she is uncertain what medication she is been taking at home for pain, she did not bring her medications today for review  Locking of the right ring finger-patient states that her right ring finger will occasionally lock when she attempts to extend it, she does have associated pain with this movement, she denies swelling in the area, her symptoms occur approximately once per day, they have been present for the past few months  Wound to right leg-patient states that approximately one week ago she fell into a box, at that time she developed a local wound, the area has been occasionally bleeding, she denies any localized redness to the area, no associated fevers or chills, she's been applying local dressing to the area  Social-nonsmoker  Review of Systems  Constitutional: Negative for fever, chills and fatigue.  Respiratory: Negative for cough, chest tightness and shortness of breath.   Cardiovascular: Negative for chest pain.  Gastrointestinal: Negative for nausea and diarrhea.  Musculoskeletal: Positive for arthralgias and joint swelling.  Skin: Positive for wound.       Objective:   Physical Exam Vitals: Reviewed General: Pleasant Caucasian female, no acute distress, accompanied  by her granddaughter exam cardiac: Regular rate rhythm, S1-S2 present, no murmurs, no heaves or thrills Respiratory: Clear to auscultation bilaterally, normal effort MSK: Triggering of the right ring finger present with extension, no palpable nodule present in the palmar aspect; left knee-mild swelling present, no erythema, tenderness present diffusely over the anterior knee, scar present from previous surgery, patient is fairly well-preserved of the left knee, able to extend the knee to approximately 160, able to flex the knee past 90 Neuro: Muscle testing was 5 out of 5 to lower leg flexion and extension as well as dorsi and plantar flexion Extremities: No lower extremity edema Skin: 1 cm x 1 cm superficial wound to the right lateral lower extremity, no active bleeding, no surrounding erythema or warmth       Assessment & Plan:  Please see problem specific assessment and plan.

## 2013-05-12 ENCOUNTER — Ambulatory Visit: Payer: PRIVATE HEALTH INSURANCE | Admitting: Family Medicine

## 2013-05-13 ENCOUNTER — Telehealth: Payer: Self-pay | Admitting: Family Medicine

## 2013-05-13 NOTE — Telephone Encounter (Signed)
Spoke to patient, she has not had a chance to get the knee xray that had been ordered for her at last visit. Informed her that she needs to have xray of knee before I can prescribe her a knee brace.

## 2013-05-13 NOTE — Telephone Encounter (Signed)
Pt called and wanted to know when the doctor was going to send the approval for her leg brace. JW

## 2013-05-13 NOTE — Telephone Encounter (Signed)
I am going to forward to Dr Randolm Idol as he has seen her and I have not.THANKS! [name]

## 2013-05-25 ENCOUNTER — Other Ambulatory Visit: Payer: Self-pay | Admitting: Family Medicine

## 2013-07-02 ENCOUNTER — Other Ambulatory Visit: Payer: Self-pay | Admitting: Family Medicine

## 2013-07-03 ENCOUNTER — Ambulatory Visit (INDEPENDENT_AMBULATORY_CARE_PROVIDER_SITE_OTHER): Payer: PRIVATE HEALTH INSURANCE | Admitting: Family Medicine

## 2013-07-03 ENCOUNTER — Ambulatory Visit (HOSPITAL_COMMUNITY)
Admission: RE | Admit: 2013-07-03 | Discharge: 2013-07-03 | Disposition: A | Discharge status: Home / Self Care | Payer: PRIVATE HEALTH INSURANCE | Source: Ambulatory Visit | Attending: Family Medicine | Admitting: Family Medicine

## 2013-07-03 ENCOUNTER — Encounter: Payer: Self-pay | Admitting: Family Medicine

## 2013-07-03 VITALS — BP 155/46 | HR 77 | Temp 100.0°F | Ht 60.0 in | Wt 229.6 lb

## 2013-07-03 DIAGNOSIS — R05 Cough: Secondary | ICD-10-CM | POA: Insufficient documentation

## 2013-07-03 DIAGNOSIS — M47814 Spondylosis without myelopathy or radiculopathy, thoracic region: Secondary | ICD-10-CM | POA: Insufficient documentation

## 2013-07-03 DIAGNOSIS — R509 Fever, unspecified: Secondary | ICD-10-CM | POA: Insufficient documentation

## 2013-07-03 DIAGNOSIS — R059 Cough, unspecified: Secondary | ICD-10-CM | POA: Insufficient documentation

## 2013-07-03 MED ORDER — LEVOFLOXACIN 500 MG PO TABS: 500.0000 mg | ORAL_TABLET | Freq: Every day | ORAL | Status: DC

## 2013-07-03 MED ORDER — IPRATROPIUM BROMIDE 0.02 % IN SOLN
0.5000 mg | Freq: Once | RESPIRATORY_TRACT | Status: AC
  Administered 2013-07-03: 0.5 mg via RESPIRATORY_TRACT

## 2013-07-03 MED ORDER — ALBUTEROL SULFATE (2.5 MG/3ML) 0.083% IN NEBU
2.5000 mg | INHALATION_SOLUTION | Freq: Once | RESPIRATORY_TRACT | Status: AC
  Administered 2013-07-03: 2.5 mg via RESPIRATORY_TRACT

## 2013-07-03 NOTE — Progress Notes (Signed)
Subjective:    [name] [name] is a 80 y.o. female [facility] cough:  1.  Cough and fever:  Present for about 1 week.  Has had subjective fevers at home with chills.  Cough productive of thick green sputum, worse at night.  Some dry hacking cough at night too.  Also with history of asthma, has been using inhaler 4-5 times a day with only some improvement.  Malaise for same period of time.  Eating and drinking well.  No nausea or vomiting.  Good PO fluid intake.  Some sharp pleuritic pain with cough but no chest pain otherwise.  No LE edema   ROS as above per HPI, otherwise neg.  Pertinently, no chest pain, palpitations, SOB, Fever, Chills, Abd pain, N/V/D.   The following portions of the patient's history were reviewed and updated as appropriate: allergies, current medications, past medical history, family and social history, and problem list. Patient is a nonsmoker.    PMH reviewed.  Past Medical History  Diagnosis Date  . History of cystoscopy 08/2005    normal  . Asthma   . Hypertension   . Diabetes mellitus without complication   . Neuropathy    Past Surgical History  Procedure Laterality Date  . Cataract extraction w/ intraocular lens implant  2012    bilateral  . Total knee arthroplasty  02/2010    left    Medications reviewed. Current Outpatient Prescriptions  Medication Sig Dispense Refill  . ACCU-CHEK COMPACT PLUS test strip TEST BLOOD SUGAR THREE TIMES DAILY  100 each  12  . albuterol (PROVENTIL HFA;VENTOLIN HFA) 108 (90 BASE) MCG/ACT inhaler Inhale 2 puffs into the lungs every 4 (four) hours as needed for wheezing.  1 Inhaler  3  . allopurinol (ZYLOPRIM) 300 MG tablet Take 1 tablet (300 mg total) by mouth daily.  90 tablet  3  . aspirin (BAYER ASPIRIN) 325 MG tablet Take 325 mg by mouth daily.       . citalopram (CELEXA) 20 MG tablet TAKE ONE TABLET BY MOUTH ONCE DAILY  90 tablet  0  . fluticasone (FLONASE) 50 MCG/ACT nasal spray Place 2 sprays  into the nose daily.  16 g  6  . furosemide (LASIX) 40 MG tablet Take 2 tablets in the AM and 1 tablet at 2 PM  90 tablet  5  . guaiFENesin-codeine 100-10 MG/5ML syrup Take 10 mLs by mouth 4 (four) times daily as needed for cough.  120 mL  1  . insulin aspart (NOVOLOG FLEXPEN) 100 UNIT/ML injection Inject 2-8 Units into the skin 3 (three) times daily with meals as needed for high blood sugar.  1 vial  11  . Insulin Glargine (LANTUS SOLOSTAR) 100 UNIT/ML SOPN Inject 50 Units into the skin 2 (two) times daily.  30 mL  11  . Insulin Pen Needle (B-D UF III MINI PEN NEEDLES) 31G X 5 MM MISC Inject 50 Units into the skin 2 (two) times daily.  100 each  11  . Lancets (ONETOUCH ULTRASOFT) lancets Use as instructed  100 each  12  . metFORMIN (GLUMETZA) 500 MG (MOD) 24 hr tablet Take 2 in AM and 1 in PM      . metoprolol succinate (TOPROL-XL) 25 MG 24 hr tablet Take 1 tablet (25 mg total) by mouth daily.  30 tablet  11  . Multiple Vitamin (MULTIVITAMIN WITH MINERALS) TABS Take 1 tablet by mouth daily.      Marland Kitchen. nystatin (MYCOSTATIN)  powder Apply topically 3 (three) times daily.  60 g  3  . pregabalin (LYRICA) 150 MG capsule Take 1 capsule (150 mg total) by mouth at bedtime.  90 capsule  3  . rOPINIRole (REQUIP) 4 MG tablet TAKE ONE TABLET BY MOUTH AT BEDTIME  180 tablet  0  . silver sulfADIAZINE (SILVADENE) 1 % cream Apply 1 application topically daily.      . simvastatin (ZOCOR) 20 MG tablet Take 1 tablet (20 mg total) by mouth at bedtime.  30 tablet  11  . Spacer/Aero-Holding Chambers (E-Z SPACER) inhaler Use with albuterol inhaler - 2 puffs every 4 hours as needed for wheezing, shortness of breath.  1 each  0  . traMADol-acetaminophen (ULTRACET) 37.5-325 MG per tablet Take 2 tablets by mouth every 8 (eight) hours as needed. For pain      . traZODone (DESYREL) 50 MG tablet Take 0.5-1 tablets (25-50 mg total) by mouth at bedtime as needed for sleep.  30 tablet  0  . valsartan (DIOVAN) 80 MG tablet Take 0.5  tablets (40 mg total) by mouth daily.  30 tablet  2  . [DISCONTINUED] VESICARE 5 MG tablet TAKE ONE TABLET BY MOUTH ONE TIME DAILY  30 tablet  11   Current Facility-Administered Medications  Medication Dose Route Frequency Provider Last Rate Last Dose  . cyanocobalamin ((VITAMIN B-12)) injection 1,000 mcg  1,000 mcg Intramuscular Once Zachery Dauer, MD         Objective:   Physical Exam BP 155/46  Pulse 77  Temp(Src) 100 F (37.8 C) (Oral)  Ht 5' (1.524 m)  Wt 229 lb 9.6 oz (104.146 kg)  BMI 44.84 kg/m2  SpO2 88% Gen:  Alert, cooperative patient who appears stated age in no acute distress.  Vital signs reviewed.  Looks somewhat ill appearing, but non-toxic.  NAD, sitting comfortably in chair.   HEENT: EOMI,  MMM.  Nose:  No exudates noted.   Cardiac:  Regular rate and rhythm without murmur auscultated.  Good S1/S2. Pulm:  Has wheezing noted bilaterally.  Also with focal crackles not cleared with cough RLL.  Questionable dullness to percussion here.   Exts: No edema BL LE's    Repeat Lung exam after nebulizer treatment:  Wheezing improved.  However still with some slight persistent crackles.    No results found for this or any previous visit (from the past 72 hour(s)).

## 2013-07-03 NOTE — Assessment & Plan Note (Signed)
With wheezing in clinic, treated with nebulizer here with improvement.   Of note, patient states her baseline pulse ox runs in the 80s, so this is not worse than usual for her.  Wonder if she has some component of COPD, consider PFTs once improved.   Concern for PNA based on symptoms and exam. History of asthma, I am treating with Levaquin today as she is somewhat ill-appearing, though no distress and non-toxic appearing.  QTc based on last EKG not prolonged, so this medication should be safe for her.   Can consider steroids for acute bronchitis if no discernible PNA on CXR. Will call patient with results.  Return precautions provided. FU next week to ensure she's improved, if worsening to return or go to the ED.

## 2013-07-03 NOTE — Patient Instructions (Signed)
Go straight to the hospital to get your xray.  Pick up the Levaquin and take the first dose tonight and continue for 7 days.   Come back and see us in the middle of the week so we can make sure you're doing okay.  Come back sooner if worsening or go to the hospital/ED over the weekend.

## 2013-07-04 ENCOUNTER — Telehealth: Payer: Self-pay | Admitting: Family Medicine

## 2013-07-04 MED ORDER — PREDNISONE 50 MG PO TABS: ORAL_TABLET | ORAL | Status: DC

## 2013-07-04 NOTE — Telephone Encounter (Signed)
Called patient.  States she is "feeling better" but still with cough.  Breathing has improved somewhat.  Relayed negative CXR for PNA.  Will send in Prednisone x 5 days for likely COPD/asthma exacerbation.  To keep appt for next week to ensure improvement.  Pt expressed understanding of plan

## 2013-07-06 ENCOUNTER — Encounter (HOSPITAL_COMMUNITY): Payer: Self-pay | Admitting: Emergency Medicine

## 2013-07-06 ENCOUNTER — Emergency Department (HOSPITAL_COMMUNITY)
Admission: EM | Admit: 2013-07-06 | Discharge: 2013-07-07 | Disposition: A | Discharge status: Home / Self Care | Payer: PRIVATE HEALTH INSURANCE | Attending: Emergency Medicine | Admitting: Emergency Medicine

## 2013-07-06 DIAGNOSIS — R739 Hyperglycemia, unspecified: Secondary | ICD-10-CM

## 2013-07-06 DIAGNOSIS — Y939 Activity, unspecified: Secondary | ICD-10-CM | POA: Insufficient documentation

## 2013-07-06 DIAGNOSIS — I10 Essential (primary) hypertension: Secondary | ICD-10-CM | POA: Insufficient documentation

## 2013-07-06 DIAGNOSIS — T59811A Toxic effect of smoke, accidental (unintentional), initial encounter: Secondary | ICD-10-CM | POA: Insufficient documentation

## 2013-07-06 DIAGNOSIS — J45909 Unspecified asthma, uncomplicated: Secondary | ICD-10-CM | POA: Insufficient documentation

## 2013-07-06 DIAGNOSIS — Y929 Unspecified place or not applicable: Secondary | ICD-10-CM | POA: Insufficient documentation

## 2013-07-06 DIAGNOSIS — G589 Mononeuropathy, unspecified: Secondary | ICD-10-CM | POA: Insufficient documentation

## 2013-07-06 LAB — CBC
HEMATOCRIT: 36.2 % (ref 36.0–46.0)
Hemoglobin: 11.9 g/dL — ABNORMAL LOW (ref 12.0–15.0)
MCH: 31 pg (ref 26.0–34.0)
MCHC: 32.9 g/dL (ref 30.0–36.0)
MCV: 94.3 fL (ref 78.0–100.0)
PLATELETS: 220 10*3/uL (ref 150–400)
RBC: 3.84 MIL/uL — ABNORMAL LOW (ref 3.87–5.11)
RDW: 14.2 % (ref 11.5–15.5)
WBC: 10.7 10*3/uL — AB (ref 4.0–10.5)

## 2013-07-06 LAB — COMPREHENSIVE METABOLIC PANEL
ALT: 10 U/L (ref 0–35)
AST: 15 U/L (ref 0–37)
Albumin: 2.9 g/dL — ABNORMAL LOW (ref 3.5–5.2)
Alkaline Phosphatase: 96 U/L (ref 39–117)
BILIRUBIN TOTAL: 0.2 mg/dL — AB (ref 0.3–1.2)
BUN: 33 mg/dL — AB (ref 6–23)
CHLORIDE: 94 meq/L — AB (ref 96–112)
CO2: 27 meq/L (ref 19–32)
CREATININE: 1.42 mg/dL — AB (ref 0.50–1.10)
Calcium: 8.9 mg/dL (ref 8.4–10.5)
GFR calc Af Amer: 40 mL/min — ABNORMAL LOW (ref >90)
GFR, EST NON AFRICAN AMERICAN: 35 mL/min — AB (ref >90)
Glucose, Bld: 436 mg/dL — ABNORMAL HIGH (ref 70–99)
POTASSIUM: 5.6 meq/L — AB (ref 3.7–5.3)
Sodium: 131 mEq/L — ABNORMAL LOW (ref 137–147)
Total Protein: 6.9 g/dL (ref 6.0–8.3)

## 2013-07-06 LAB — GLUCOSE, CAPILLARY
Glucose-Capillary: 431 mg/dL — ABNORMAL HIGH (ref 70–99)
Glucose-Capillary: 331 mg/dL — ABNORMAL HIGH (ref 70–99)

## 2013-07-06 MED ORDER — INSULIN ASPART 100 UNIT/ML ~~LOC~~ SOLN
12.0000 [IU] | Freq: Once | SUBCUTANEOUS | Status: AC
  Administered 2013-07-06: 12 [IU] via SUBCUTANEOUS
  Filled 2013-07-06: qty 1

## 2013-07-06 MED ORDER — SODIUM CHLORIDE 0.9 % IV BOLUS (SEPSIS)
1000.0000 mL | Freq: Once | INTRAVENOUS | Status: AC
  Administered 2013-07-06: 1000 mL via INTRAVENOUS

## 2013-07-06 MED ORDER — SODIUM CHLORIDE 0.9 % IV BOLUS (SEPSIS)
1000.0000 mL | Freq: Once | INTRAVENOUS | Status: AC
  Administered 2013-07-07: 1000 mL via INTRAVENOUS

## 2013-07-06 MED ORDER — SODIUM POLYSTYRENE SULFONATE 15 GM/60ML PO SUSP
30.0000 g | Freq: Once | ORAL | Status: AC
  Administered 2013-07-07: 30 g via ORAL
  Filled 2013-07-06: qty 120

## 2013-07-06 MED ORDER — SODIUM CHLORIDE 0.9 % IV SOLN
INTRAVENOUS | Status: DC
  Administered 2013-07-06: 125 mL/h via INTRAVENOUS

## 2013-07-06 NOTE — Discharge Instructions (Signed)

## 2013-07-06 NOTE — ED Provider Notes (Signed)
CSN: [id number]     Arrival date & time 07/06/13  2115 History   First MD Initiated Contact with Patient 07/06/13 2116     Chief Complaint  Patient presents with  . Hyperglycemia   (Consider location/radiation/quality/duration/timing/severity/associated sxs/prior Treatment) Patient is a 80 y.o. female presenting with hyperglycemia. The history is provided by the patient.  Hyperglycemia  patient here complaining of hyperglycemia with a CBG of 547. She admits to being copious amounts of cookies today. States her sugar normally runs in the 130s. She has not been compliant with her diabetic diet. Did take her evening insulin already. Notes polyuria and polydipsia. No abdominal pain or vomiting. No chest pain chest pressure. Denies any blurred vision. Symptoms are persistent and EMS was called and patient transported here  Past Medical History  Diagnosis Date  . History of cystoscopy 08/2005    normal  . Asthma   . Hypertension   . Diabetes mellitus without complication   . Neuropathy    Past Surgical History  Procedure Laterality Date  . Cataract extraction w/ intraocular lens implant  2012    bilateral  . Total knee arthroplasty  02/2010    left   Family History  Problem Relation Age of Onset  . Alcohol abuse Son   . Alcohol abuse Daughter     died  . Diabetes Mother   . Diabetes Daughter   . Diabetes Sister   . Coronary artery disease Sister     MI  . Alzheimer's disease Sister     died of heart proble  . Diabetes Daughter   . Diabetes Daughter   . Diabetes Son   . Diabetes Son    History  Substance Use Topics  . Smoking status: Passive Smoke Exposure - Never Smoker  . Smokeless tobacco: Not on file  . Alcohol Use: No   OB History   Grav Para Term Preterm Abortions TAB SAB Ect Mult Living                 Review of Systems  All other systems reviewed and are negative.    Allergies  Clindamycin/lincomycin; Tramadol; Cephalexin; Codeine; Enalapril maleate; and  Mirtazapine  Home Medications   Current Outpatient Rx  Name  Route  Sig  Dispense  Refill  . ACCU-CHEK COMPACT PLUS test strip      TEST BLOOD SUGAR THREE TIMES DAILY   100 each   12   . albuterol (PROVENTIL HFA;VENTOLIN HFA) 108 (90 BASE) MCG/ACT inhaler   Inhalation   Inhale 2 puffs into the lungs every 4 (four) hours as needed for wheezing.   1 Inhaler   3     Pt having sore mouth/tongue with Proventil. Needs  ...   . allopurinol (ZYLOPRIM) 300 MG tablet   Oral   Take 1 tablet (300 mg total) by mouth daily.   90 tablet   3   . aspirin (BAYER ASPIRIN) 325 MG tablet   Oral   Take 325 mg by mouth daily.          . citalopram (CELEXA) 20 MG tablet      TAKE ONE TABLET BY MOUTH ONCE DAILY   90 tablet   0   . fluticasone (FLONASE) 50 MCG/ACT nasal spray   Nasal   Place 2 sprays into the nose daily.   16 g   6   . furosemide (LASIX) 40 MG tablet      Take 2 tablets in the AM and 1  tablet at 2 PM   90 tablet   5   . guaiFENesin-codeine 100-10 MG/5ML syrup   Oral   Take 10 mLs by mouth 4 (four) times daily as needed for cough.   120 mL   1   . insulin aspart (NOVOLOG FLEXPEN) 100 UNIT/ML injection   Subcutaneous   Inject 2-8 Units into the skin 3 (three) times daily with meals as needed for high blood sugar.   1 vial   11   . Insulin Glargine (LANTUS SOLOSTAR) 100 UNIT/ML SOPN   Subcutaneous   Inject 50 Units into the skin 2 (two) times daily.   30 mL   11   . Insulin Pen Needle (B-D UF III MINI PEN NEEDLES) 31G X 5 MM MISC   Subcutaneous   Inject 50 Units into the skin 2 (two) times daily.   100 each   11   . Lancets (ONETOUCH ULTRASOFT) lancets      Use as instructed   100 each   12   . levofloxacin (LEVAQUIN) 500 MG tablet   Oral   Take 1 tablet (500 mg total) by mouth daily.   7 tablet   0   . metFORMIN (GLUMETZA) 500 MG (MOD) 24 hr tablet      Take 2 in AM and 1 in PM         . metoprolol succinate (TOPROL-XL) 25 MG 24 hr  tablet   Oral   Take 1 tablet (25 mg total) by mouth daily.   30 tablet   11   . Multiple Vitamin (MULTIVITAMIN WITH MINERALS) TABS   Oral   Take 1 tablet by mouth daily.         Marland Kitchen. nystatin (MYCOSTATIN) powder   Topical   Apply topically 3 (three) times daily.   60 g   3   . predniSONE (DELTASONE) 50 MG tablet      Take 1 pill daily x 5 days   5 tablet   0   . pregabalin (LYRICA) 150 MG capsule   Oral   Take 1 capsule (150 mg total) by mouth at bedtime.   90 capsule   3   . rOPINIRole (REQUIP) 4 MG tablet      TAKE ONE TABLET BY MOUTH AT BEDTIME   180 tablet   0   . silver sulfADIAZINE (SILVADENE) 1 % cream   Topical   Apply 1 application topically daily.         . simvastatin (ZOCOR) 20 MG tablet   Oral   Take 1 tablet (20 mg total) by mouth at bedtime.   30 tablet   11   . Spacer/Aero-Holding Chambers (E-Z SPACER) inhaler      Use with albuterol inhaler - 2 puffs every 4 hours as needed for wheezing, shortness of breath.   1 each   0   . traMADol-acetaminophen (ULTRACET) 37.5-325 MG per tablet   Oral   Take 2 tablets by mouth every 8 (eight) hours as needed. For pain         . traZODone (DESYREL) 50 MG tablet   Oral   Take 0.5-1 tablets (25-50 mg total) by mouth at bedtime as needed for sleep.   30 tablet   0   . valsartan (DIOVAN) 80 MG tablet   Oral   Take 0.5 tablets (40 mg total) by mouth daily.   30 tablet   2    BP 159/51  Pulse 72  Temp(Src)  98.5 F (36.9 C) (Oral)  Resp 20  SpO2 95% Physical Exam  Nursing note and vitals reviewed. Constitutional: She is oriented to person, place, and time. She appears well-developed and well-nourished.  Non-toxic appearance. No distress.  HENT:  Head: Normocephalic and atraumatic.  Eyes: Conjunctivae, EOM and lids are normal. Pupils are equal, round, and reactive to light.  Neck: Normal range of motion. Neck supple. No tracheal deviation present. No mass present.  Cardiovascular: Normal  rate, regular rhythm and normal heart sounds.  Exam reveals no gallop.   No murmur heard. Pulmonary/Chest: Effort normal and breath sounds normal. No stridor. No respiratory distress. She has no decreased breath sounds. She has no wheezes. She has no rhonchi. She has no rales.  Abdominal: Soft. Normal appearance and bowel sounds are normal. She exhibits no distension. There is no tenderness. There is no rebound and no CVA tenderness.  Musculoskeletal: Normal range of motion. She exhibits no edema and no tenderness.  Neurological: She is alert and oriented to person, place, and time. She has normal strength. No cranial nerve deficit or sensory deficit. GCS eye subscore is 4. GCS verbal subscore is 5. GCS motor subscore is 6.  Skin: Skin is warm and dry. No abrasion and no rash noted.  Psychiatric: She has a normal mood and affect. Her speech is normal and behavior is normal.    ED Course  Procedures (including critical care time) Labs Review Labs Reviewed  GLUCOSE, CAPILLARY - Abnormal; Notable for the following:    Glucose-Capillary 431 (*)    All other components within normal limits  CBC  COMPREHENSIVE METABOLIC PANEL  URINALYSIS, ROUTINE W REFLEX MICROSCOPIC   Imaging Review No results found.  EKG Interpretation   None       MDM  No diagnosis found. Patient given IV fluids and insulin for her hyperglycemia. Was sugar is rechecked here as needed 300s. She has mild hypokalemia and was treated with Kayexalate. I will order a repeat bmet which will be checked out to Dr. Maryagnes Amos, MD 07/06/13 [phone]

## 2013-07-06 NOTE — ED Notes (Addendum)
Report from PT[facility]> Pt from home with complaint of hyperglycemia.  CBG 547 per [facility].  Pt denies any symptoms other than feeling nervous.  States her blood sugar is normally around 130s but she has been eating a lot of cookies today. States she has taken her insulin today. Call pt's daughter if she needs a ride home Herbert Seta(Heather [phone])

## 2013-07-07 LAB — BASIC METABOLIC PANEL
BUN: 31 mg/dL — AB (ref 6–23)
CHLORIDE: 102 meq/L (ref 96–112)
CO2: 25 meq/L (ref 19–32)
CREATININE: 1.31 mg/dL — AB (ref 0.50–1.10)
Calcium: 8.4 mg/dL (ref 8.4–10.5)
GFR calc Af Amer: 44 mL/min — ABNORMAL LOW (ref >90)
GFR calc non Af Amer: 38 mL/min — ABNORMAL LOW (ref >90)
Glucose, Bld: 283 mg/dL — ABNORMAL HIGH (ref 70–99)
Potassium: 4.9 mEq/L (ref 3.7–5.3)
Sodium: 137 mEq/L (ref 137–147)

## 2013-07-07 LAB — CG4 I-STAT (LACTIC ACID): Lactic Acid, Venous: 1.51 mmol/L (ref 0.5–2.2)

## 2013-07-07 NOTE — ED Notes (Signed)
Dr. Opitz notified of CG-4 result 

## 2013-07-07 NOTE — ED Provider Notes (Signed)
1:21 AM after PO and IVFs patient is feeling better and requesting to be discharged home. Labs reviewed as below. Blood sugar improved. Plan f/u PCP. Continue medications. Return precautions provided.  Results for orders placed during the hospital encounter of 07/06/13  CBC      Result Value Range   WBC 10.7 (*) 4.0 - 10.5 K/uL   RBC 3.84 (*) 3.87 - 5.11 MIL/uL   Hemoglobin 11.9 (*) 12.0 - 15.0 g/dL   HCT 16.136.2  09.636.0 - 04.546.0 %   MCV 94.3  78.0 - 100.0 fL   MCH 31.0  26.0 - 34.0 pg   MCHC 32.9  30.0 - 36.0 g/dL   RDW 40.914.2  81.111.5 - 91.415.5 %   Platelets 220  150 - 400 K/uL  COMPREHENSIVE METABOLIC PANEL      Result Value Range   Sodium 131 (*) 137 - 147 mEq/L   Potassium 5.6 (*) 3.7 - 5.3 mEq/L   Chloride 94 (*) 96 - 112 mEq/L   CO2 27  19 - 32 mEq/L   Glucose, Bld 436 (*) 70 - 99 mg/dL   BUN 33 (*) 6 - 23 mg/dL   Creatinine, Ser 7.821.42 (*) 0.50 - 1.10 mg/dL   Calcium 8.9  8.4 - 95.610.5 mg/dL   Total Protein 6.9  6.0 - 8.3 g/dL   Albumin 2.9 (*) 3.5 - 5.2 g/dL   AST 15  0 - 37 U/L   ALT 10  0 - 35 U/L   Alkaline Phosphatase 96  39 - 117 U/L   Total Bilirubin 0.2 (*) 0.3 - 1.2 mg/dL   GFR calc non Af Amer 35 (*) >90 mL/min   GFR calc Af Amer 40 (*) >90 mL/min  GLUCOSE, CAPILLARY      Result Value Range   Glucose-Capillary 431 (*) 70 - 99 mg/dL  GLUCOSE, CAPILLARY      Result Value Range   Glucose-Capillary 331 (*) 70 - 99 mg/dL  BASIC METABOLIC PANEL      Result Value Range   Sodium 137  137 - 147 mEq/L   Potassium 4.9  3.7 - 5.3 mEq/L   Chloride 102  96 - 112 mEq/L   CO2 25  19 - 32 mEq/L   Glucose, Bld 283 (*) 70 - 99 mg/dL   BUN 31 (*) 6 - 23 mg/dL   Creatinine, Ser 2.131.31 (*) 0.50 - 1.10 mg/dL   Calcium 8.4  8.4 - 08.610.5 mg/dL   GFR calc non Af Amer 38 (*) >90 mL/min   GFR calc Af Amer 44 (*) >90 mL/min      Sunnie NielsenBrian Genecis Veley, MD 07/07/13 0122

## 2013-07-07 NOTE — ED Notes (Signed)
Called number provided for Pt Ride home with no answer.  Voice mail left for a [employer] Service [employer] GroupHeather [name].

## 2013-07-08 ENCOUNTER — Telehealth: Payer: Self-pay | Admitting: Family Medicine

## 2013-07-09 ENCOUNTER — Ambulatory Visit: Payer: PRIVATE HEALTH INSURANCE | Admitting: Family Medicine

## 2013-07-10 ENCOUNTER — Other Ambulatory Visit: Payer: Self-pay | Admitting: Family Medicine

## 2013-07-11 ENCOUNTER — Ambulatory Visit: Payer: PRIVATE HEALTH INSURANCE | Admitting: Family Medicine

## 2013-07-15 ENCOUNTER — Ambulatory Visit: Payer: PRIVATE HEALTH INSURANCE | Admitting: Family Medicine

## 2013-07-16 ENCOUNTER — Encounter: Payer: Self-pay | Admitting: Family Medicine

## 2013-07-16 ENCOUNTER — Ambulatory Visit (INDEPENDENT_AMBULATORY_CARE_PROVIDER_SITE_OTHER): Payer: PRIVATE HEALTH INSURANCE | Admitting: Family Medicine

## 2013-07-16 VITALS — BP 138/76 | HR 71 | Temp 98.3°F | Ht 60.0 in | Wt 224.1 lb

## 2013-07-16 DIAGNOSIS — E1149 Type 2 diabetes mellitus with other diabetic neurological complication: Secondary | ICD-10-CM

## 2013-07-16 LAB — POCT GLYCOSYLATED HEMOGLOBIN (HGB A1C): Hemoglobin A1C: 8.1

## 2013-07-16 NOTE — Patient Instructions (Signed)
It has been a pleasure to see you today. Please take the medications as prescribed. They are not changes in your current regimen.  I will call you with the labs results if they come back abnormal otherwise you will receive a letter. Make your next appointment to follow up with your primary doctor in 3 months or sooner if needed

## 2013-07-16 NOTE — Progress Notes (Signed)
Family Medicine Office Visit Note   Subjective:   Patient ID: Jomarie LongsShirley J Cary, female  DOB: 12/09/1933, 80 y.o.. MRN: [id number]   Pt that comes today for follow up. She was recently seen in ED with hyperglycemia, pt reports was secondary to be on prednisone for COPD exacerbation. She now reports injecting Lantus 50 units BID and her home CBG's are in the 130-150's range, she has used Novolog on SSI only occasionally. Denies hypoglycemic events. Has no other complaints.  From her breathing stand point she reports feeling better and denies current symptoms.  Review of Systems:  Pt denies SOB, chest pain, palpitations, headaches, dizziness, numbness or weakness. No changes on urinary or BM habits. No unintentional weigh loss/gain.  Objective:   Physical Exam: Gen:  NAD HEENT: Moist mucous membranes  CV: Regular rate and rhythm, no murmurs rubs or gallops PULM: Clear to auscultation bilaterally. No wheezes/rales/rhonchi ABD: Soft, non tender, non distended, normal bowel sounds EXT: No edema Neuro: Alert and oriented x3. No focalization  Assessment & Plan:

## 2013-07-17 LAB — TSH: TSH: 2.312 u[IU]/mL (ref 0.350–4.500)

## 2013-07-17 NOTE — Assessment & Plan Note (Signed)
A1C 8.1 improved from 09/2012, even though  She had recently hyperglycemia due to steroid use.  No hypoglycemic symptoms. No changes in current plan F/u as scheduled with primary doctor.

## 2013-07-21 ENCOUNTER — Encounter: Payer: Self-pay | Admitting: Family Medicine

## 2013-07-23 ENCOUNTER — Telehealth: Payer: Self-pay | Admitting: Family Medicine

## 2013-07-23 NOTE — Telephone Encounter (Signed)
[facility] needing a [facility] Referral for patient. Patient's appt in today, 1/28 @ 2 pm. DX code to use is 366.16

## 2013-07-25 NOTE — Telephone Encounter (Signed)
Pt doesn't have any referral in EPIC.  Spoke with Dr. Dione BoozeGroat office and they will let pt know that she needs to contact PCP for referral.   [place]

## 2013-07-25 NOTE — Telephone Encounter (Signed)
Dear [name] Team Please callher and find out if this has been taken careof? If not, what is she seeing Dr Dione BoozeGroat for? Once I know that I can do a referral THANKS! Denny LevySara [name]

## 2013-08-05 NOTE — Telephone Encounter (Signed)
Dear [name] Team What is the status of this? Denny LevySara Antavious Spanos

## 2013-08-05 NOTE — Telephone Encounter (Signed)
Lvm for paitent to call back about below. Just needing to know when she plans on going [facility]. She no showed her last appointment. I will then put in the referral back in to them through silver back.

## 2013-08-22 ENCOUNTER — Telehealth: Payer: Self-pay

## 2013-08-22 NOTE — Telephone Encounter (Signed)
Refill request for EpiPen. Patient states she needs this ASAP. Advised patient that this may not be done until next week due to it being, 4:30 on Friday and Dr. Jennette KettleNeal not being here.

## 2013-08-25 ENCOUNTER — Other Ambulatory Visit: Payer: Self-pay | Admitting: Family Medicine

## 2013-08-25 NOTE — Telephone Encounter (Signed)
Return call to pt, she needs a refill on her insulin pen. Clovis PuMartin, Cliffard Hair L, RN

## 2013-08-25 NOTE — Telephone Encounter (Signed)
[name] [name] Can you check on something for me? She called wanting a refill on an EPI PEN. I don't see that on her med list (not even in history). I don't mind giving her one--I just want to make sure it is not her INSULIN pen that she needs.  If it truly is the EPIPEN--does she use it for asthma or what?  THANKS! Denny LevySara Spence Soberano

## 2013-08-26 MED ORDER — INSULIN ASPART 100 UNIT/ML ~~LOC~~ SOLN: 2.0000 [IU] | Freq: Three times a day (TID) | SUBCUTANEOUS | Status: DC | PRN

## 2013-09-02 ENCOUNTER — Other Ambulatory Visit: Payer: Self-pay | Admitting: Family Medicine

## 2013-09-05 NOTE — Telephone Encounter (Signed)
I am faxing to [facility] [address] [phone] /me

## 2013-11-05 ENCOUNTER — Other Ambulatory Visit: Payer: Self-pay | Admitting: Family Medicine

## 2013-11-07 ENCOUNTER — Other Ambulatory Visit: Payer: Self-pay | Admitting: Family Medicine

## 2013-11-12 ENCOUNTER — Ambulatory Visit: Payer: PRIVATE HEALTH INSURANCE | Admitting: Family Medicine

## 2013-11-19 ENCOUNTER — Encounter: Payer: Self-pay | Admitting: Family Medicine

## 2013-11-19 ENCOUNTER — Ambulatory Visit (INDEPENDENT_AMBULATORY_CARE_PROVIDER_SITE_OTHER): Payer: PRIVATE HEALTH INSURANCE | Admitting: Family Medicine

## 2013-11-19 VITALS — BP 157/57 | HR 64 | Temp 98.8°F | Ht 60.0 in | Wt 232.2 lb

## 2013-11-19 DIAGNOSIS — E1149 Type 2 diabetes mellitus with other diabetic neurological complication: Secondary | ICD-10-CM

## 2013-11-19 DIAGNOSIS — D510 Vitamin B12 deficiency anemia due to intrinsic factor deficiency: Secondary | ICD-10-CM

## 2013-11-19 DIAGNOSIS — M48061 Spinal stenosis, lumbar region without neurogenic claudication: Secondary | ICD-10-CM

## 2013-11-19 DIAGNOSIS — I10 Essential (primary) hypertension: Secondary | ICD-10-CM

## 2013-11-19 DIAGNOSIS — J209 Acute bronchitis, unspecified: Secondary | ICD-10-CM

## 2013-11-19 LAB — POCT GLYCOSYLATED HEMOGLOBIN (HGB A1C): HEMOGLOBIN A1C: 8.2

## 2013-11-19 MED ORDER — CYANOCOBALAMIN 1000 MCG/ML IJ SOLN
1000.0000 ug | Freq: Once | INTRAMUSCULAR | Status: AC
  Administered 2013-11-19: 1000 ug via INTRAMUSCULAR

## 2013-11-19 MED ORDER — ALBUTEROL SULFATE HFA 108 (90 BASE) MCG/ACT IN AERS: 2.0000 | INHALATION_SPRAY | RESPIRATORY_TRACT | Status: DC | PRN

## 2013-11-20 ENCOUNTER — Encounter: Payer: Self-pay | Admitting: Family Medicine

## 2013-11-20 LAB — COMPREHENSIVE METABOLIC PANEL
ALBUMIN: 3.4 g/dL — AB (ref 3.5–5.2)
ALT: 14 U/L (ref 0–35)
AST: 19 U/L (ref 0–37)
Alkaline Phosphatase: 105 U/L (ref 39–117)
BUN: 19 mg/dL (ref 6–23)
CALCIUM: 8.9 mg/dL (ref 8.4–10.5)
CHLORIDE: 100 meq/L (ref 96–112)
CO2: 31 mEq/L (ref 19–32)
Creat: 1.23 mg/dL — ABNORMAL HIGH (ref 0.50–1.10)
GLUCOSE: 95 mg/dL (ref 70–99)
POTASSIUM: 5.1 meq/L (ref 3.5–5.3)
SODIUM: 138 meq/L (ref 135–145)
TOTAL PROTEIN: 6.1 g/dL (ref 6.0–8.3)
Total Bilirubin: 0.4 mg/dL (ref 0.2–1.2)

## 2013-11-20 LAB — LDL CHOLESTEROL, DIRECT: Direct LDL: 79 mg/dL

## 2013-11-21 NOTE — Progress Notes (Signed)
   Subjective:    Patient ID: DIANEY BELARDE, female    DOB: Dec 31, 1933, 80 y.o.   MRN: [id number]  HPI Followup chronic robins. She needs refills. She's had a little bit of a cough that is nonproductive. Also had some back pain particularly when she tries to stand up. It is located mostly on the right in the thoracic area. No increase in her chronic lower extremity pains. No problems with her current medications. She does need refills on her albuterol inhaler.   Review of Systems Denies chest pain, palpitations, lower extremity edema. Has baseline shortness of breath that has not changed. Intermittent chronic cough is nonproductive. No fever, sweats, chills. No episodes of hypoglycemia. No increased urination.    Objective:   Physical Exam  Vital signs reviewed. GENERAL: Well-developed, overweight female no acute distress. Walks with a walker. CARDIOVASCULAR: Regular rate and rhythm no murmur gallop or rub LUNGS: Clear to auscultation bilaterally, no rales or wheeze. ABDOMEN: Soft positive bowel sounds NEURO: No gross focal neurological deficits. MSK: Movement of extremity x 4. Rises from a chair with extreme difficulty but is able to perform without assistance other than use of her walker. She walks with a stooped posture leaning heavily on her walker short stride length.        Assessment & Plan:

## 2013-11-21 NOTE — Assessment & Plan Note (Signed)
We'll check A1c today. She likes the insulin pen best and is taking 50 units twice a day. Refills given.

## 2013-11-21 NOTE — Assessment & Plan Note (Signed)
Continues to have pretty significant lower extremity pains and disability secondary to lumbar stenosis. Continue to use walker. I think her mid back pain that she stopped about today is musculoskeletal mostly from or weakness. We talked about trying to do several sit to stand exercises daily in addition to what she typically does to see if we can improve her core strength.

## 2013-11-21 NOTE — Assessment & Plan Note (Signed)
Blood pressure her with wide pulse pressure systolic 157. Unclear what her optimal goal would be, I suspect somewhat permissive blood pressure would be best some I told the less than 160 systolic. Continue her current medications. We'll check some blood work today particularly her creatinine.

## 2013-12-01 ENCOUNTER — Telehealth: Payer: Self-pay | Admitting: Family Medicine

## 2013-12-01 MED ORDER — OXYBUTYNIN CHLORIDE 5 MG PO TABS: 2.5000 mg | ORAL_TABLET | Freq: Two times a day (BID) | ORAL | Status: DC

## 2013-12-01 NOTE — Telephone Encounter (Signed)
Dr [name] patient would like something called in for incontinence,she states that she doesn't get a change to get to the bathroom by the time she  takes her pull up off that she already wet her self.Please advise.Thank you.Amedeo Gory S [name]

## 2013-12-01 NOTE — Telephone Encounter (Signed)
Pt called and would like someone to call her concerning her bathroom issues. jw

## 2013-12-01 NOTE — Telephone Encounter (Signed)
Dear [facility] Asters Team Please tell her I have called in some medicine THANKS! [name] [name]

## 2013-12-01 NOTE — Telephone Encounter (Signed)
Patient was informed and voiced great appreciation.[name] [name]

## 2013-12-08 ENCOUNTER — Other Ambulatory Visit: Payer: Self-pay | Admitting: Family Medicine

## 2013-12-31 ENCOUNTER — Other Ambulatory Visit: Payer: Self-pay | Admitting: Family Medicine

## 2014-01-18 ENCOUNTER — Other Ambulatory Visit: Payer: Self-pay | Admitting: Family Medicine

## 2014-02-02 ENCOUNTER — Other Ambulatory Visit: Payer: Self-pay | Admitting: Family Medicine

## 2014-02-16 ENCOUNTER — Other Ambulatory Visit: Payer: Self-pay | Admitting: Family Medicine

## 2014-02-18 ENCOUNTER — Inpatient Hospital Stay (HOSPITAL_COMMUNITY)
Admission: EM | Admit: 2014-02-18 | Discharge: 2014-02-20 | DRG: 689 | Disposition: A | Discharge status: Home Health | Payer: PRIVATE HEALTH INSURANCE | Attending: Family Medicine | Admitting: Family Medicine

## 2014-02-18 ENCOUNTER — Encounter (HOSPITAL_COMMUNITY): Payer: Self-pay | Admitting: Emergency Medicine

## 2014-02-18 ENCOUNTER — Emergency Department (HOSPITAL_COMMUNITY): Payer: PRIVATE HEALTH INSURANCE

## 2014-02-18 DIAGNOSIS — I129 Hypertensive chronic kidney disease with stage 1 through stage 4 chronic kidney disease, or unspecified chronic kidney disease: Secondary | ICD-10-CM | POA: Diagnosis present

## 2014-02-18 DIAGNOSIS — E662 Morbid (severe) obesity with alveolar hypoventilation: Secondary | ICD-10-CM | POA: Diagnosis present

## 2014-02-18 DIAGNOSIS — M48061 Spinal stenosis, lumbar region without neurogenic claudication: Secondary | ICD-10-CM

## 2014-02-18 DIAGNOSIS — J45909 Unspecified asthma, uncomplicated: Secondary | ICD-10-CM | POA: Diagnosis present

## 2014-02-18 DIAGNOSIS — I498 Other specified cardiac arrhythmias: Secondary | ICD-10-CM | POA: Diagnosis present

## 2014-02-18 DIAGNOSIS — E1149 Type 2 diabetes mellitus with other diabetic neurological complication: Secondary | ICD-10-CM | POA: Diagnosis present

## 2014-02-18 DIAGNOSIS — I959 Hypotension, unspecified: Secondary | ICD-10-CM | POA: Diagnosis present

## 2014-02-18 DIAGNOSIS — E119 Type 2 diabetes mellitus without complications: Secondary | ICD-10-CM | POA: Diagnosis present

## 2014-02-18 DIAGNOSIS — N10 Acute tubulo-interstitial nephritis: Secondary | ICD-10-CM

## 2014-02-18 DIAGNOSIS — M6281 Muscle weakness (generalized): Secondary | ICD-10-CM | POA: Diagnosis present

## 2014-02-18 DIAGNOSIS — R0902 Hypoxemia: Secondary | ICD-10-CM | POA: Diagnosis present

## 2014-02-18 DIAGNOSIS — N390 Urinary tract infection, site not specified: Secondary | ICD-10-CM

## 2014-02-18 DIAGNOSIS — G4733 Obstructive sleep apnea (adult) (pediatric): Secondary | ICD-10-CM | POA: Diagnosis present

## 2014-02-18 DIAGNOSIS — A498 Other bacterial infections of unspecified site: Secondary | ICD-10-CM | POA: Diagnosis present

## 2014-02-18 DIAGNOSIS — J9620 Acute and chronic respiratory failure, unspecified whether with hypoxia or hypercapnia: Secondary | ICD-10-CM | POA: Diagnosis present

## 2014-02-18 DIAGNOSIS — N179 Acute kidney failure, unspecified: Secondary | ICD-10-CM | POA: Diagnosis present

## 2014-02-18 DIAGNOSIS — R509 Fever, unspecified: Secondary | ICD-10-CM

## 2014-02-18 DIAGNOSIS — N183 Chronic kidney disease, stage 3 unspecified: Secondary | ICD-10-CM | POA: Diagnosis present

## 2014-02-18 DIAGNOSIS — F3289 Other specified depressive episodes: Secondary | ICD-10-CM | POA: Diagnosis present

## 2014-02-18 DIAGNOSIS — F329 Major depressive disorder, single episode, unspecified: Secondary | ICD-10-CM | POA: Diagnosis present

## 2014-02-18 DIAGNOSIS — R32 Unspecified urinary incontinence: Secondary | ICD-10-CM | POA: Diagnosis present

## 2014-02-18 DIAGNOSIS — Z6841 Body Mass Index (BMI) 40.0 and over, adult: Secondary | ICD-10-CM

## 2014-02-18 DIAGNOSIS — I10 Essential (primary) hypertension: Secondary | ICD-10-CM | POA: Diagnosis present

## 2014-02-18 DIAGNOSIS — G4730 Sleep apnea, unspecified: Secondary | ICD-10-CM | POA: Diagnosis present

## 2014-02-18 LAB — URINE MICROSCOPIC-ADD ON

## 2014-02-18 LAB — PRO B NATRIURETIC PEPTIDE: PRO B NATRI PEPTIDE: 258 pg/mL (ref 0–450)

## 2014-02-18 LAB — COMPREHENSIVE METABOLIC PANEL
ALK PHOS: 117 U/L (ref 39–117)
ALT: 19 U/L (ref 0–35)
AST: 32 U/L (ref 0–37)
Albumin: 3.3 g/dL — ABNORMAL LOW (ref 3.5–5.2)
Anion gap: 16 — ABNORMAL HIGH (ref 5–15)
BUN: 26 mg/dL — ABNORMAL HIGH (ref 6–23)
CO2: 27 meq/L (ref 19–32)
Calcium: 9 mg/dL (ref 8.4–10.5)
Chloride: 94 mEq/L — ABNORMAL LOW (ref 96–112)
Creatinine, Ser: 1.76 mg/dL — ABNORMAL HIGH (ref 0.50–1.10)
GFR calc non Af Amer: 27 mL/min — ABNORMAL LOW (ref >90)
GFR, EST AFRICAN AMERICAN: 31 mL/min — AB (ref >90)
GLUCOSE: 297 mg/dL — AB (ref 70–99)
Potassium: 4.7 mEq/L (ref 3.7–5.3)
SODIUM: 137 meq/L (ref 137–147)
Total Bilirubin: 0.5 mg/dL (ref 0.3–1.2)
Total Protein: 7.3 g/dL (ref 6.0–8.3)

## 2014-02-18 LAB — CBC WITH DIFFERENTIAL/PLATELET
BASOS ABS: 0 10*3/uL (ref 0.0–0.1)
Basophils Relative: 0 % (ref 0–1)
Eosinophils Absolute: 0 10*3/uL (ref 0.0–0.7)
Eosinophils Relative: 0 % (ref 0–5)
HCT: 41.2 % (ref 36.0–46.0)
Hemoglobin: 13.2 g/dL (ref 12.0–15.0)
LYMPHS ABS: 0.6 10*3/uL — AB (ref 0.7–4.0)
LYMPHS PCT: 8 % — AB (ref 12–46)
MCH: 30.5 pg (ref 26.0–34.0)
MCHC: 32 g/dL (ref 30.0–36.0)
MCV: 95.2 fL (ref 78.0–100.0)
Monocytes Absolute: 0.3 10*3/uL (ref 0.1–1.0)
Monocytes Relative: 4 % (ref 3–12)
NEUTROS PCT: 88 % — AB (ref 43–77)
Neutro Abs: 6.9 10*3/uL (ref 1.7–7.7)
PLATELETS: 145 10*3/uL — AB (ref 150–400)
RBC: 4.33 MIL/uL (ref 3.87–5.11)
RDW: 15 % (ref 11.5–15.5)
WBC: 7.8 10*3/uL (ref 4.0–10.5)

## 2014-02-18 LAB — URINALYSIS, ROUTINE W REFLEX MICROSCOPIC
Bilirubin Urine: NEGATIVE
Glucose, UA: NEGATIVE mg/dL
Ketones, ur: NEGATIVE mg/dL
Nitrite: POSITIVE — AB
PH: 5 (ref 5.0–8.0)
PROTEIN: 30 mg/dL — AB
Specific Gravity, Urine: 1.016 (ref 1.005–1.030)
Urobilinogen, UA: 1 mg/dL (ref 0.0–1.0)

## 2014-02-18 LAB — I-STAT CG4 LACTIC ACID, ED: Lactic Acid, Venous: 1.95 mmol/L (ref 0.5–2.2)

## 2014-02-18 LAB — TROPONIN I: Troponin I: <0.3 ng/mL (ref <0.30)

## 2014-02-18 MED ORDER — SODIUM CHLORIDE 0.9 % IV BOLUS (SEPSIS)
1000.0000 mL | Freq: Once | INTRAVENOUS | Status: AC
  Administered 2014-02-18: 1000 mL via INTRAVENOUS

## 2014-02-18 MED ORDER — VANCOMYCIN HCL IN DEXTROSE 1-5 GM/200ML-% IV SOLN
1000.0000 mg | Freq: Once | INTRAVENOUS | Status: DC
  Filled 2014-02-18: qty 200

## 2014-02-18 MED ORDER — CITALOPRAM HYDROBROMIDE 20 MG PO TABS
20.0000 mg | ORAL_TABLET | Freq: Every day | ORAL | Status: DC
  Administered 2014-02-19 – 2014-02-20 (×2): 20 mg via ORAL
  Filled 2014-02-18 (×2): qty 1

## 2014-02-18 MED ORDER — ROPINIROLE HCL 1 MG PO TABS
4.0000 mg | ORAL_TABLET | Freq: Every day | ORAL | Status: DC
  Administered 2014-02-19 (×2): 4 mg via ORAL
  Filled 2014-02-18 (×4): qty 4

## 2014-02-18 MED ORDER — PREGABALIN 75 MG PO CAPS
150.0000 mg | ORAL_CAPSULE | Freq: Every day | ORAL | Status: DC
  Administered 2014-02-19 (×2): 150 mg via ORAL
  Filled 2014-02-18 (×2): qty 2

## 2014-02-18 MED ORDER — ASPIRIN 325 MG PO TABS
325.0000 mg | ORAL_TABLET | Freq: Every day | ORAL | Status: DC
  Administered 2014-02-19: 325 mg via ORAL
  Filled 2014-02-18 (×2): qty 1

## 2014-02-18 MED ORDER — ACETAMINOPHEN 650 MG RE SUPP
650.0000 mg | Freq: Once | RECTAL | Status: AC
  Administered 2014-02-18: 650 mg via RECTAL
  Filled 2014-02-18: qty 1

## 2014-02-18 MED ORDER — ENOXAPARIN SODIUM 30 MG/0.3ML ~~LOC~~ SOLN
30.0000 mg | SUBCUTANEOUS | Status: DC
  Administered 2014-02-19 (×2): 30 mg via SUBCUTANEOUS
  Filled 2014-02-18 (×3): qty 0.3

## 2014-02-18 MED ORDER — METOPROLOL SUCCINATE ER 25 MG PO TB24
25.0000 mg | ORAL_TABLET | Freq: Every day | ORAL | Status: DC
  Filled 2014-02-18: qty 1

## 2014-02-18 MED ORDER — SODIUM CHLORIDE 0.45 % IV SOLN
INTRAVENOUS | Status: DC
  Administered 2014-02-19 (×2): via INTRAVENOUS

## 2014-02-18 MED ORDER — FLUTICASONE PROPIONATE 50 MCG/ACT NA SUSP
2.0000 | Freq: Every day | NASAL | Status: DC
  Administered 2014-02-19 – 2014-02-20 (×2): 2 via NASAL
  Filled 2014-02-18: qty 16

## 2014-02-18 MED ORDER — DEXTROSE 5 % IV SOLN: 2.0000 g | Freq: Once | INTRAVENOUS | Status: DC

## 2014-02-18 MED ORDER — INSULIN ASPART 100 UNIT/ML ~~LOC~~ SOLN
0.0000 [IU] | Freq: Every day | SUBCUTANEOUS | Status: DC
  Administered 2014-02-19: 3 [IU] via SUBCUTANEOUS
  Administered 2014-02-19: 4 [IU] via SUBCUTANEOUS

## 2014-02-18 MED ORDER — INSULIN ASPART 100 UNIT/ML ~~LOC~~ SOLN
0.0000 [IU] | Freq: Three times a day (TID) | SUBCUTANEOUS | Status: DC
  Administered 2014-02-19 (×2): 11 [IU] via SUBCUTANEOUS
  Administered 2014-02-19: 4 [IU] via SUBCUTANEOUS
  Administered 2014-02-20: 15 [IU] via SUBCUTANEOUS
  Administered 2014-02-20 (×2): 11 [IU] via SUBCUTANEOUS

## 2014-02-18 MED ORDER — DOCUSATE SODIUM 100 MG PO CAPS
100.0000 mg | ORAL_CAPSULE | Freq: Two times a day (BID) | ORAL | Status: DC
  Administered 2014-02-19 – 2014-02-20 (×3): 100 mg via ORAL
  Filled 2014-02-18 (×4): qty 1

## 2014-02-18 MED ORDER — LEVOFLOXACIN IN D5W 750 MG/150ML IV SOLN
750.0000 mg | INTRAVENOUS | Status: DC
  Filled 2014-02-18: qty 150

## 2014-02-18 MED ORDER — ACETAMINOPHEN 325 MG PO TABS: 650.0000 mg | ORAL_TABLET | Freq: Four times a day (QID) | ORAL | Status: DC | PRN

## 2014-02-18 MED ORDER — INSULIN GLARGINE 100 UNIT/ML SOLOSTAR PEN: 50.0000 [IU] | PEN_INJECTOR | Freq: Every day | SUBCUTANEOUS | Status: DC

## 2014-02-18 MED ORDER — ALBUTEROL SULFATE (2.5 MG/3ML) 0.083% IN NEBU: 2.5000 mg | INHALATION_SOLUTION | RESPIRATORY_TRACT | Status: DC | PRN

## 2014-02-18 MED ORDER — VANCOMYCIN HCL 10 G IV SOLR
2000.0000 mg | Freq: Once | INTRAVENOUS | Status: AC
  Administered 2014-02-18: 2000 mg via INTRAVENOUS
  Filled 2014-02-18: qty 2000

## 2014-02-18 MED ORDER — ACETAMINOPHEN 650 MG RE SUPP: 650.0000 mg | Freq: Four times a day (QID) | RECTAL | Status: DC | PRN

## 2014-02-18 MED ORDER — ALLOPURINOL 300 MG PO TABS
300.0000 mg | ORAL_TABLET | Freq: Every day | ORAL | Status: DC
  Administered 2014-02-19: 300 mg via ORAL
  Filled 2014-02-18: qty 1

## 2014-02-18 MED ORDER — SODIUM CHLORIDE 0.9 % IV SOLN
1500.0000 mg | INTRAVENOUS | Status: DC
  Filled 2014-02-18: qty 1500

## 2014-02-18 MED ORDER — LEVOFLOXACIN IN D5W 750 MG/150ML IV SOLN
750.0000 mg | Freq: Once | INTRAVENOUS | Status: AC
  Administered 2014-02-18: 750 mg via INTRAVENOUS
  Filled 2014-02-18: qty 150

## 2014-02-18 MED ORDER — SIMVASTATIN 20 MG PO TABS
20.0000 mg | ORAL_TABLET | Freq: Every day | ORAL | Status: DC
  Administered 2014-02-19 – 2014-02-20 (×2): 20 mg via ORAL
  Filled 2014-02-18 (×2): qty 1

## 2014-02-18 MED ORDER — TRAZODONE 25 MG HALF TABLET
25.0000 mg | ORAL_TABLET | Freq: Every day | ORAL | Status: DC
  Administered 2014-02-19 (×2): 50 mg via ORAL
  Filled 2014-02-18 (×3): qty 2

## 2014-02-18 MED ORDER — IBUPROFEN 800 MG PO TABS
800.0000 mg | ORAL_TABLET | Freq: Once | ORAL | Status: AC
  Administered 2014-02-18: 800 mg via ORAL
  Filled 2014-02-18: qty 1

## 2014-02-18 NOTE — Consult Note (Signed)
ANTIBIOTIC CONSULT NOTE - INITIAL  Pharmacy Consult for Vancomycin and Levaquin Indication: rule out pneumonia  Allergies  Allergen Reactions  . Clindamycin/Lincomycin Rash    Itchy rash and burned stomach  . Tramadol Other (See Comments)    Tremor, possible mild serotonin syndrome  . Cephalexin     REACTION: oral ulcerations, vomiting diarrhea  . Codeine Itching and Nausea And Vomiting  . Enalapril Maleate     REACTION: Facial swelling and urticaria and blisters on lips  . Mirtazapine     REACTION: edema per patient    Patient Measurements: Height: 5' 3.5" (161.3 cm) Weight: 235 lb (106.595 kg) IBW/kg (Calculated) : 53.55  Vital Signs: Temp: 102.7 F (39.3 C) (08/26 2120) Temp src: Oral (08/26 2120) BP: 95/41 mmHg (08/26 2115) Pulse Rate: 80 (08/26 2115) Intake/Output from previous day:   Intake/Output from this shift:    Labs:  Recent Labs  02/18/14 2009  WBC 7.8  HGB 13.2  PLT 145*  CREATININE 1.76*   Estimated Creatinine Clearance: 31.1 ml/min (by C-G formula based on Cr of 1.76).  Microbiology: No results found for this or any previous visit (from the past 720 hour(s)).  Medical History: Past Medical History  Diagnosis Date  . History of cystoscopy 08/2005    normal  . Asthma   . Hypertension   . Diabetes mellitus without complication   . Neuropathy    Assessment: [name] [name] to begin vancomycin and levaquin for possible CAP (CXR pending). She is in acute renal failure with sCr elevated at 1.76 (baseline appears to be ~ 1.2-1.3). Will use the obesity dosing nomogram given weight of 106.6kg.  Goal of Therapy:  Vancomycin trough level 15-20 mcg/ml  Plan:  1) Vancomycin 2g IV x 1 then  IV q24 2) Levaquin  IV q48 3) Follow renal function, cultures, LOT, level if needed  Fredrik Rigger 02/18/2014,9:35 PM

## 2014-02-18 NOTE — H&P (Signed)
Family Medicine Teaching [facility] Admission History and Physical Service Pager: [phone]  Patient name: [name] [name] Medical record number: [id number] Date of birth: 04/25/1934 Age: 80 y.o. Gender: female  Primary Care Provider: [name], MD Consultants: none Code Status: full  Chief Complaint: weakness  Assessment and Plan: IYANAH DEMONT is a 80 y.o. female presenting with generalized weakness and fatigue, found to be hypoxic with UTI. PMH is significant for HTN, DM2, OSA  #Complicated cystitis- febrile and hypotensive although technically does not meet SIRS criteria; no white count not tachypneic; pt with urinary incontinence at baseline and DM placing at higher risk for infection; no other source at this time although may be somewhat dehydrated and may take some fluids for something to fluff out on CXR; will cover broadly for now but plan to narrow quickly as are able; would be slightly unusual for UTI alone to cause such high fever and hypoxia -admit to tele under Dr. Mauricio Po -vitals per floor protocol -follow up Blood cultures/Urine cultures -cont vanc, will potentially switch levaquin to cipro for potential pseudomonas coverage in DM patient -trend fever curve -abdominal exams  -hold oxybutynin  #Acute on likely chronic resp failure with OSA/OHS- potentially patient getting close to need for home O2; BNP not elevated; will monitor for signs of resp infection; well score essentially 0; pt with warm calves bilaterally equal and symmetric in edema -CPAP overnight as able to tolerate -O2 to maintain sats >92  #Generalized weakness- potentially related to complex cystitis or as acute precipitant, does have hx of restless leg syndrome as well as PVD, spinal stenosis and OA -requip -PT/OT consults  -lyrica  -encourage UOB as pt is able   #DM- 8.2 on 11/19/13; may be contributing to weakness; will repeat A1C here -cont half of home lantus 50u now -resistant SSI -cbgs  qhs/ac -suspect will need to add more   #HTN- on valsartan and metoprolol; normotensive to hypotensive here but responded well to bolus -cont metoprolol tmw for cardioprotection -hold valsartan   #AKI on CKD stage iii- Cr baseline around 1.2-1.4; likely 2/2 to some dehydration, esp given borderline pressures on admission -will gently rehydrate with IVF -hold lasix for now -avoid nephrotoxic agents, consider stopping PPI  #Depression- potentially contributing to above weakness/fatigue -cont celexa -touch base with Dr. [name] (seems to know pt fairly well)  FEN/GI: IVF ; carb mod Prophylaxis: lovenox for GFR <30  Disposition: admit to tele under Dr. Mauricio Po (day attending Dr McDiarmid)  History of Present Illness: [name] [name] is a 80 y.o. female presenting with generalized weakness and lethargy for the past day. Daughter states that mother was somewhat disoriented and not able to move as much when she checked on her earlier. Denies diarrhea or vomiting but did have some nausea. No dysuria or hematuria. No SOB, palps chest discomfort. Was on CPAP along time ago but has not been on in a while. EMS was called pt found to be sating 88% on RA, was placed on . Pt generally difficult historian.  In the ED required 4L of O2 for hypoxia. CXR was neg. No tachycardia. Unable to do imaging with contrast given GFR. Febrile to 102.7. U/A with nitrites and leuks and many bacteria. Was given levaquin and vanc (blood cultures obtained prior). Was additionally given 1NC bolus.   Review Of Systems: Per HPI with the following additions: none Otherwise 12 point review of systems was performed and was unremarkable.  Patient Active Problem List   Diagnosis Date  Noted  . History of total left knee replacement 04/28/2013  . Trigger finger, acquired 04/28/2013  . Forgetfulness 08/27/2012  . Morbid obesity with BMI of 40.0-44.9, adult 04/30/2012  . Vitamin D deficiency 04/23/2012  . Kidney disease,  chronic, stage III (GFR 30-59 ml/min) 07/14/2011  . 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 OSTEOPOROSIS 03/31/2010  . SPINAL STENOSIS, LUMBAR 03/10/2010  . GAIT DISTURBANCE 01/04/2010  . RESTLESS LEGS SYNDROME 07/16/2008  . DIABETES MELLITUS, TYPE II, CONTROLLED, W/NEURO COMPS 02/05/2008  . URINARY INCONTINENCE 11/14/2007  . ANEMIA, PERNICIOUS 03/11/2007  . HYPERLIPIDEMIA 08/23/2006  . DEPRESSIVE DISORDER, NOS 08/23/2006  . HYPERTENSION, BENIGN SYSTEMIC 08/23/2006  . VENOUS INSUFFICIENCY, CHRONIC 08/23/2006  . GASTROESOPHAGEAL REFLUX, NO ESOPHAGITIS 08/23/2006  . OSTEOARTHRITIS, LOWER LEG 08/23/2006  . APNEA, SLEEP 08/23/2006   Past Medical History: Past Medical History  Diagnosis Date  . History of cystoscopy 08/2005    normal  . Asthma   . Hypertension   . Diabetes mellitus without complication   . Neuropathy    Past Surgical History: Past Surgical History  Procedure Laterality Date  . Cataract extraction w/ intraocular lens implant  2012    bilateral  . Total knee arthroplasty  02/2010    left   Social History: History  Substance Use Topics  . Smoking status: Passive Smoke Exposure - Never Smoker  . Smokeless tobacco: Not on file  . Alcohol Use: No   Additional social history: none Please also refer to relevant sections of EMR.  Family History: Family History  Problem Relation Age of Onset  . Alcohol abuse Son   . Alcohol abuse Daughter     died  . Diabetes Mother   . Diabetes Daughter   . Diabetes Sister   . Coronary artery disease Sister     MI  . Alzheimer's disease Sister     died of heart proble  . Diabetes Daughter   . Diabetes Daughter   . Diabetes Son   . Diabetes Son    Allergies and Medications: Allergies  Allergen Reactions  . Clindamycin/Lincomycin Rash    Itchy rash and burned stomach  . Tramadol Other (See Comments)    Tremor, possible mild serotonin syndrome  . Cephalexin     REACTION: oral ulcerations,  vomiting diarrhea  . Codeine Itching and Nausea And Vomiting  . Enalapril Maleate     REACTION: Facial swelling and urticaria and blisters on lips  . Mirtazapine     REACTION: edema per patient   Current Facility-Administered Medications on File Prior to Encounter  Medication Dose Route Frequency Provider Last Rate Last Dose  . cyanocobalamin ((VITAMIN B-12)) injection 1,000 mcg  1,000 mcg Intramuscular Once Zachery Dauer, MD       Current Outpatient Prescriptions on File Prior to Encounter  Medication Sig Dispense Refill  . aspirin (BAYER ASPIRIN) 325 MG tablet Take 325 mg by mouth daily.       . Multiple Vitamin (MULTIVITAMIN WITH MINERALS) TABS Take 1 tablet by mouth daily.      . predniSONE (DELTASONE) 50 MG tablet Take 1 pill daily x 5 days  5 tablet  0  . silver sulfADIAZINE (SILVADENE) 1 % cream Apply 1 application topically daily.      . traMADol-acetaminophen (ULTRACET) 37.5-325 MG per tablet Take 2 tablets by mouth every 8 (eight) hours as needed. For pain      . [DISCONTINUED] VESICARE 5 MG tablet TAKE ONE TABLET BY MOUTH ONE TIME DAILY  30  tablet  11    Objective: BP 122/36  Pulse 71  Temp(Src) 99.9 F (37.7 C) (Oral)  Resp 19  Ht 5' 3.5" (1.613 m)  Wt 235 lb (106.595 kg)  BMI 40.97 kg/m2  SpO2 96% Exam: General: awake, alert, talkative HEENT: NCAT, EOMI, PERRL, no pharyngeal erythema Cardiovascular: RRR, no m/r/g/ Respiratory: CTAB, on 3L [place], normal WOB Abdomen: obese, tympanic, non tender non distended, no CVA tenderness Extremities: 1+ pitting edema equal and symmetric bilat; cannot obtain DPs Skin: chronic bruises, no signs of open wounds or ulcers Neuro: A&OX3; no focal neuro deficits  Labs and Imaging: CBC BMET   Recent Labs Lab 02/18/14 2009  WBC 7.8  HGB 13.2  HCT 41.2  PLT 145*    Recent Labs Lab 02/18/14 2009  NA 137  K 4.7  CL 94*  CO2 27  BUN 26*  CREATININE 1.76*  GLUCOSE 297*  CALCIUM 9.0     Trop neg BNP 250s Lactic acid  1.95  02/18/14 CXR IMPRESSION:  No acute disease. Stable compared to prior exams.  Charlane Ferretti, MD 02/18/2014, 10:47 PM PGY-2, [facility] FPTS Intern pager: [phone], text pages welcome

## 2014-02-18 NOTE — ED Notes (Signed)
Attempted report 

## 2014-02-18 NOTE — ED Provider Notes (Signed)
CSN: [id number]     Arrival date & time 02/18/14  1954 History   First MD Initiated Contact with Patient 02/18/14 2002     Chief Complaint  Patient presents with  . Fatigue     (Consider location/radiation/quality/duration/timing/severity/associated sxs/prior Treatment) HPI Comments: Patient is a 80 year old female with history of diabetes, hypertension, asthma who presents to the emergency department with generalized weakness and lethargy. Patient is a poor historian, but states she has been feeling bad since yesterday. She denies pain anywhere. She denies shortness of breath or dysuria. No nausea, vomiting, diarrhea per the patient. EMS was called and oxygen saturations were found to be at 88% on room air. Per the triage note patient endorses nausea for EMS and was given Zofran.  The history is provided by the patient. No language interpreter was used.    Past Medical History  Diagnosis Date  . History of cystoscopy 08/2005    normal  . Asthma   . Hypertension   . Diabetes mellitus without complication   . Neuropathy    Past Surgical History  Procedure Laterality Date  . Cataract extraction w/ intraocular lens implant  2012    bilateral  . Total knee arthroplasty  02/2010    left   Family History  Problem Relation Age of Onset  . Alcohol abuse Son   . Alcohol abuse Daughter     died  . Diabetes Mother   . Diabetes Daughter   . Diabetes Sister   . Coronary artery disease Sister     MI  . Alzheimer's disease Sister     died of heart proble  . Diabetes Daughter   . Diabetes Daughter   . Diabetes Son   . Diabetes Son    History  Substance Use Topics  . Smoking status: Passive Smoke Exposure - Never Smoker  . Smokeless tobacco: Not on file  . Alcohol Use: No   OB History   Grav Para Term Preterm Abortions TAB SAB Ect Mult Living                 Review of Systems  Constitutional: Positive for fever.  Respiratory: Negative for shortness of breath.    Cardiovascular: Negative for chest pain.  Gastrointestinal: Positive for nausea (per ems). Negative for vomiting and abdominal pain.  Genitourinary: Negative for dysuria.  All other systems reviewed and are negative.     Allergies  Clindamycin/lincomycin; Tramadol; Cephalexin; Codeine; Enalapril maleate; and Mirtazapine  Home Medications   Prior to Admission medications   Medication Sig Start Date End Date Taking? Authorizing Provider  ACCU-CHEK COMPACT PLUS test strip TEST BLOOD SUGAR THREE TIMES DAILY 07/02/13   Nestor Ramp, MD  albuterol (PROVENTIL HFA;VENTOLIN HFA) 108 (90 BASE) MCG/ACT inhaler Inhale 2 puffs into the lungs every 4 (four) hours as needed for wheezing. 11/19/13   Nestor Ramp, MD  allopurinol (ZYLOPRIM) 300 MG tablet TAKE 1 TABLET BY MOUTH DAILY 12/08/13   Nestor Ramp, MD  aspirin (BAYER ASPIRIN) 325 MG tablet Take 325 mg by mouth daily.     Historical Provider, MD  citalopram (CELEXA) 20 MG tablet TAKE 1 TABLET BY MOUTH EVERY DAY 12/08/13   Nestor Ramp, MD  fluticasone [facility]) 50 MCG/ACT nasal spray Place 2 sprays into the nose daily. 09/04/12   Zachery Dauer, MD  furosemide (LASIX) 40 MG tablet TAKE 2 TABLETS BY MOUTH IN THE MORNING AND 1 TABLET AT 2 PM    Nestor Ramp, MD  guaiFENesin-codeine 100-10 MG/5ML syrup Take 10 mLs by mouth 4 (four) times daily as needed for cough. 12/17/12   Zachery Dauer, MD  insulin aspart (NOVOLOG FLEXPEN) 100 UNIT/ML injection Inject 2-8 Units into the skin 3 (three) times daily with meals as needed for high blood sugar. 08/26/13   Nestor Ramp, MD  Insulin Pen Needle (B-D UF III MINI PEN NEEDLES) 31G X 5 MM MISC Inject 50 Units into the skin 2 (two) times daily. 12/17/12   Zachery Dauer, MD  Lancets [facility] ULTRASOFT) lancets Use as instructed 06/14/12   Zachery Dauer, MD  LANTUS SOLOSTAR 100 UNIT/ML Solostar Pen INJECT 50 UNITS INTO THE SKIN TWICE DAILY. 12/31/13   Nestor Ramp, MD  LANTUS SOLOSTAR 100 UNIT/ML Solostar Pen INJECT 50 UNITS INTO THE SKIN  TWICE DAILY. 12/31/13   Nestor Ramp, MD  LYRICA 150 MG capsule TAKE ONE CAPSULE BY MOUTH AT BEDTIME    Nestor Ramp, MD  metFORMIN (GLUMETZA) 500 MG (MOD) 24 hr tablet Take 2 in AM and 1 in PM 11/14/12   Zachery Dauer, MD  metoprolol succinate (TOPROL-XL) 25 MG 24 hr tablet TAKE 1 TABLET BY MOUTH ONCE DAILY 07/10/13   Sanjuana Letters, MD  Multiple Vitamin (MULTIVITAMIN WITH MINERALS) TABS Take 1 tablet by mouth daily.    Historical Provider, MD  nystatin (MYCOSTATIN) powder Apply topically 3 (three) times daily. 12/17/12   Zachery Dauer, MD  oxybutynin (DITROPAN) 5 MG tablet TAKE 1/2 TABLET BY MOUTH TWICE DAILY 02/02/14   Nestor Ramp, MD  predniSONE (DELTASONE) 50 MG tablet Take 1 pill daily x 5 days 07/04/13   Tobey Grim, MD  rOPINIRole (REQUIP) 4 MG tablet TAKE 1 TABLET BY MOUTH AT BEDTIME    Nestor Ramp, MD  silver sulfADIAZINE (SILVADENE) 1 % cream Apply 1 application topically daily.    Historical Provider, MD  simvastatin (ZOCOR) 20 MG tablet TAKE 1 TABLET BY MOUTH EVERY NIGHT AT BEDTIME 02/18/14   Nestor Ramp, MD  Spacer/Aero-Holding Chambers (E-Z SPACER) inhaler Use with albuterol inhaler - 2 puffs every 4 hours as needed for wheezing, shortness of breath. 05/27/12   Napoleon Form, MD  traMADol-acetaminophen (ULTRACET) 37.5-325 MG per tablet Take 2 tablets by mouth every 8 (eight) hours as needed. For pain 04/12/12   Zachery Dauer, MD  traZODone (DESYREL) 50 MG tablet Take 0.5-1 tablets (25-50 mg total) by mouth at bedtime as needed for sleep. 01/09/13   Zachery Dauer, MD  valsartan (DIOVAN) 80 MG tablet Take 0.5 tablets (40 mg total) by mouth daily. 11/14/12   Zachery Dauer, MD   BP 117/44  Pulse 66  Temp(Src) 99.9 F (37.7 C) (Oral)  Resp 17  Ht 5' 3.5" (1.613 m)  Wt 235 lb (106.595 kg)  BMI 40.97 kg/m2  SpO2 92% Physical Exam  Nursing note and vitals reviewed. Constitutional: She is oriented to person, place, and time. She appears well-developed and well-nourished. She appears ill. No distress.   HENT:  Head: Normocephalic and atraumatic.  Right Ear: External ear normal.  Left Ear: External ear normal.  Nose: Nose normal.  Mouth/Throat: Oropharynx is clear and moist. Mucous membranes are dry.  Eyes: Conjunctivae are normal.  Neck: Normal range of motion.  No nuchal rigidity or meningeal signs  Cardiovascular: Normal rate, regular rhythm and normal heart sounds.   Pulmonary/Chest: Effort normal. No stridor. No respiratory distress. She has wheezes. She has no rales.  Abdominal: Soft. She exhibits no distension. There is  no tenderness.  Musculoskeletal: Normal range of motion.  Neurological: She is alert and oriented to person, place, and time. She has normal strength.  Skin: Skin is warm and dry. She is not diaphoretic. No erythema.  Psychiatric: She has a normal mood and affect. Her behavior is normal.    ED Course  Procedures (including critical care time) Labs Review Labs Reviewed  CBC WITH DIFFERENTIAL - Abnormal; Notable for the following:    Platelets 145 (*)    Neutrophils Relative % 88 (*)    Lymphocytes Relative 8 (*)    Lymphs Abs 0.6 (*)    All other components within normal limits  COMPREHENSIVE METABOLIC PANEL - Abnormal; Notable for the following:    Chloride 94 (*)    Glucose, Bld 297 (*)    BUN 26 (*)    Creatinine, Ser 1.76 (*)    Albumin 3.3 (*)    GFR calc non Af Amer 27 (*)    GFR calc Af Amer 31 (*)    Anion gap 16 (*)    All other components within normal limits  URINALYSIS, ROUTINE W REFLEX MICROSCOPIC - Abnormal; Notable for the following:    Color, Urine AMBER (*)    APPearance CLOUDY (*)    Hgb urine dipstick TRACE (*)    Protein, ur 30 (*)    Nitrite POSITIVE (*)    Leukocytes, UA MODERATE (*)    All other components within normal limits  URINE MICROSCOPIC-ADD ON - Abnormal; Notable for the following:    Squamous Epithelial / LPF FEW (*)    Bacteria, UA MANY (*)    All other components within normal limits  CULTURE, BLOOD (ROUTINE  X 2)  CULTURE, BLOOD (ROUTINE X 2)  URINE CULTURE  PRO B NATRIURETIC PEPTIDE  TROPONIN I  I-STAT CG4 LACTIC ACID, ED    Imaging Review Dg Chest Port 1 View  02/18/2014   CLINICAL DATA:  Fatigue.  Shortness of breath and fever.  EXAM: PORTABLE CHEST - 1 VIEW  COMPARISON:  PA and lateral chest 07/03/2013 and 04/26/2012.  FINDINGS: Mild elevation of the right hemidiaphragm is unchanged. Scarring in the left lung base is also stable in appearance. Lungs otherwise clear. Heart size normal. No pneumothorax or pleural effusion.  IMPRESSION: No acute disease.  Stable compared to prior exams.   Electronically Signed   By: Drusilla Kanner M.D.   On: 02/18/2014 20:45     EKG Interpretation   Date/Time:  Wednesday February 18 2014 20:06:33 EDT Ventricular Rate:  91 PR Interval:  176 QRS Duration: 75 QT Interval:  421 QTC Calculation: 518 R Axis:   19 Text Interpretation:  Sinus rhythm Low voltage, precordial leads Prolonged  QT interval Similar to prior Confirmed by Gwendolyn Grant  MD, BLAIR ([id number]) on  02/18/2014 8:18:13 PM      MDM   Final diagnoses:  UTI (lower urinary tract infection)  Fever, unspecified fever cause   Patient presents to the emergency department with complaint of lethargy. She has a fever of 102.4F. Patient's blood pressure was 95/41, but responded well to 2 L of fluid. Patient was started on Levaquin and vancomycin. Blood cultures were drawn prior to antibiotic initiation. Patient reports feeling improved, but I feel it is necessary that patient receive further treatment in the hospital. Patient was admitted to family practice. Admission is appreciated. Patient is hemodynamically stable at this time. Dr. Gwendolyn Grant evaluated patient and agrees with plan. Patient / Family / Caregiver informed of clinical  course, understand medical decision-making process, and agree with plan.     Mora Bellman, PA-C 02/18/14 2340

## 2014-02-18 NOTE — ED Notes (Signed)
Per EMS pt from home called out for lethargy. Fire std SpO2 88%/RA- EMS placed pt on NRB and went up to 98%. Pt endorsed nausea gave  IV zofran and placed on 6L/[place].

## 2014-02-18 NOTE — ED Notes (Signed)
Admitting at bedside 

## 2014-02-19 ENCOUNTER — Inpatient Hospital Stay (HOSPITAL_COMMUNITY): Payer: PRIVATE HEALTH INSURANCE

## 2014-02-19 DIAGNOSIS — I959 Hypotension, unspecified: Secondary | ICD-10-CM

## 2014-02-19 DIAGNOSIS — N390 Urinary tract infection, site not specified: Principal | ICD-10-CM

## 2014-02-19 LAB — BASIC METABOLIC PANEL
Anion gap: 12 (ref 5–15)
BUN: 30 mg/dL — AB (ref 6–23)
CO2: 26 mEq/L (ref 19–32)
Calcium: 7.3 mg/dL — ABNORMAL LOW (ref 8.4–10.5)
Chloride: 103 mEq/L (ref 96–112)
Creatinine, Ser: 2.13 mg/dL — ABNORMAL HIGH (ref 0.50–1.10)
GFR calc Af Amer: 24 mL/min — ABNORMAL LOW (ref >90)
GFR calc non Af Amer: 21 mL/min — ABNORMAL LOW (ref >90)
GLUCOSE: 158 mg/dL — AB (ref 70–99)
POTASSIUM: 4.5 meq/L (ref 3.7–5.3)
Sodium: 141 mEq/L (ref 137–147)

## 2014-02-19 LAB — GRAM STAIN: Special Requests: NORMAL

## 2014-02-19 LAB — CBC
HEMATOCRIT: 35.7 % — AB (ref 36.0–46.0)
Hemoglobin: 11 g/dL — ABNORMAL LOW (ref 12.0–15.0)
MCH: 30.3 pg (ref 26.0–34.0)
MCHC: 30.8 g/dL (ref 30.0–36.0)
MCV: 98.3 fL (ref 78.0–100.0)
PLATELETS: 121 10*3/uL — AB (ref 150–400)
RBC: 3.63 MIL/uL — AB (ref 3.87–5.11)
RDW: 15.4 % (ref 11.5–15.5)
WBC: 5.8 10*3/uL (ref 4.0–10.5)

## 2014-02-19 LAB — GLUCOSE, CAPILLARY
GLUCOSE-CAPILLARY: 292 mg/dL — AB (ref 70–99)
Glucose-Capillary: 158 mg/dL — ABNORMAL HIGH (ref 70–99)
Glucose-Capillary: 254 mg/dL — ABNORMAL HIGH (ref 70–99)
Glucose-Capillary: 262 mg/dL — ABNORMAL HIGH (ref 70–99)
Glucose-Capillary: 336 mg/dL — ABNORMAL HIGH (ref 70–99)

## 2014-02-19 LAB — HEMOGLOBIN A1C
Hgb A1c MFr Bld: 8.3 % — ABNORMAL HIGH (ref <5.7)
Mean Plasma Glucose: 192 mg/dL — ABNORMAL HIGH (ref <117)

## 2014-02-19 MED ORDER — SODIUM CHLORIDE 0.9 % IV BOLUS (SEPSIS)
500.0000 mL | Freq: Once | INTRAVENOUS | Status: AC
Start: 2014-02-19 — End: 2014-02-19
  Administered 2014-02-19: 500 mL via INTRAVENOUS

## 2014-02-19 MED ORDER — INSULIN GLARGINE 100 UNIT/ML ~~LOC~~ SOLN
50.0000 [IU] | Freq: Every day | SUBCUTANEOUS | Status: DC
  Administered 2014-02-19 (×2): 50 [IU] via SUBCUTANEOUS
  Filled 2014-02-19 (×3): qty 0.5

## 2014-02-19 MED ORDER — METHYLPREDNISOLONE SODIUM SUCC 125 MG IJ SOLR
60.0000 mg | Freq: Once | INTRAMUSCULAR | Status: AC
  Administered 2014-02-19: 60 mg via INTRAVENOUS
  Filled 2014-02-19: qty 2
  Filled 2014-02-19: qty 0.96

## 2014-02-19 MED ORDER — SODIUM CHLORIDE 0.9 % IV SOLN
INTRAVENOUS | Status: DC
  Administered 2014-02-19 – 2014-02-20 (×4): via INTRAVENOUS

## 2014-02-19 MED ORDER — SODIUM CHLORIDE 0.9 % IV BOLUS (SEPSIS)
1000.0000 mL | Freq: Once | INTRAVENOUS | Status: AC
  Administered 2014-02-19: 1000 mL via INTRAVENOUS

## 2014-02-19 MED ORDER — VANCOMYCIN HCL 10 G IV SOLR
1500.0000 mg | INTRAVENOUS | Status: DC
  Filled 2014-02-19: qty 1500

## 2014-02-19 NOTE — Evaluation (Signed)
Occupational Therapy Evaluation Patient Details Name: [name] [name] MRN: [id number] DOB: Oct 30, 1933 Today's Date: 02/19/2014    History of Present Illness Pt. admitted 8/26 with fever and hypotension.  Suspected UTI but cultures pending.  Prior history includes asthma, DM, neuropathy and L TKA.     Clinical Impression   Pt was living alone with support of her family for shower transfer and housekeeping. Pt avoided LB dressing. Pt now presents with poor activity tolerance, generalized weakness, and impaired balance interfering with ADL.  Will follow acutely.   Follow Up Recommendations  Home health OT;Supervision/Assistance - 24 hour    Equipment Recommendations  None recommended by OT    Recommendations for Other Services       Precautions / Restrictions Precautions Precautions: Fall Precaution Comments: keep sats above 92% Restrictions Weight Bearing Restrictions: No      Mobility Bed Mobility Overal bed mobility:  (not assessed, pt up in chair) Bed Mobility: Supine to Sit     Supine to sit: Supervision     General bed mobility comments: supervision for safetyh and use of bedrail to assist self to sitting.   with pt. at EOB she was unable to reach to put her own socks on and needed full assist for this task.  Transfers Overall transfer level: Needs assistance Equipment used: Rolling walker (2 wheeled) Transfers: Sit to/from Stand Sit to Stand: Min assist         General transfer comment: min assist to rise to stand and to control descent; tends to have decreased sitting control even withuse of UEs on chair    Balance Overall balance assessment: Needs assistance Sitting-balance support: No upper extremity supported;Feet supported Sitting balance-Leahy Scale: Good     Standing balance support: Bilateral upper extremity supported;During functional activity Standing balance-Leahy Scale: Poor Standing balance comment: needed support of RW for upright stance and  stability                            ADL Overall ADL's : Needs assistance/impaired Eating/Feeding: Set up;Sitting Eating/Feeding Details (indicate cue type and reason): assist to cut meat Grooming: Wash/dry hands;Standing;Min guard   Upper Body Bathing: Sitting;Set up   Lower Body Bathing: Moderate assistance;Sit to/from stand   Upper Body Dressing : Minimal assistance;Standing   Lower Body Dressing: Moderate assistance;Sit to/from stand   Toilet Transfer: Minimal assistance;Regular Toilet;RW   Toileting- Clothing Manipulation and Hygiene: Minimal assistance;Sit to/from stand       Functional mobility during ADLs: Minimal assistance;Rolling walker       Vision                     Perception     Praxis      Pertinent Vitals/Pain Pain Assessment: No/denies pain     Hand Dominance Right   Extremity/Trunk Assessment Upper Extremity Assessment Upper Extremity Assessment: RUE deficits/detail RUE Deficits / Details: longstanding shoulder limitations of unknown cause RUE Coordination: decreased gross motor   Lower Extremity Assessment Lower Extremity Assessment: Defer to PT evaluation   Cervical / Trunk Assessment Cervical / Trunk Assessment: Kyphotic   Communication Communication Communication: HOH   Cognition Arousal/Alertness: Awake/alert Behavior During Therapy: WFL for tasks assessed/performed Overall Cognitive Status: Within Functional Limits for tasks assessed                     General Comments       Exercises  Shoulder Instructions      Home Living Family/patient expects to be discharged to:: Private residence Living Arrangements: Alone Available Help at Discharge: Family;Available 24 hours/day;Other (Comment) Type of Home: Mobile home Home Access: Ramped entrance     Home Layout: One level     Bathroom Shower/Tub: Producer, television/film/video: Standard     Home Equipment: Environmental consultant - 4  wheels;Bedside commode   Additional Comments: says she does not use BSC      Prior Functioning/Environment Level of Independence: Needs assistance  Gait / Transfers Assistance Needed: ambulates with RW ADL's / Homemaking Assistance Needed: granddaughter helps her shower, does housekeeping and changes her bed.  Pt does her own laundry.  She avoids socks and wears slip on shoes due to difficulty accessing her feet.   Comments: still drives on a limited basis    OT Diagnosis: Generalized weakness   OT Problem List: Decreased strength;Decreased range of motion;Decreased activity tolerance;Impaired balance (sitting and/or standing);Cardiopulmonary status limiting activity;Obesity;Impaired UE functional use   OT Treatment/Interventions: Self-care/ADL training;Energy conservation;DME and/or AE instruction;Patient/family education    OT Goals(Current goals can be found in the care plan section) Acute Rehab OT Goals Patient Stated Goal: home and daughter helping her as needed OT Goal Formulation: With patient Time For Goal Achievement: 03/05/14 Potential to Achieve Goals: Good  OT Frequency: Min 2X/week   Barriers to D/C:            Co-evaluation              End of Session Equipment Utilized During Treatment: Oxygen (3L)  Activity Tolerance: Patient limited by fatigue (pt stated she did not sleep well last night) Patient left: in chair;with call bell/phone within reach   Time: 1610-9604 OT Time Calculation (min): 22 min Charges:  OT General Charges $OT Visit: 1 Procedure OT Evaluation $Initial OT Evaluation Tier I: 1 Procedure OT Treatments $Self Care/Home Management : 8-22 mins G-Codes:    Evern Bio 02/19/2014, 1:32 PM [phone]

## 2014-02-19 NOTE — Progress Notes (Signed)
Pt refused CPAP at this time.

## 2014-02-19 NOTE — Evaluation (Addendum)
Physical Therapy Evaluation Patient Details Name: [name] [name] MRN: [id number] DOB: Mar 26, 1934 Today's Date: 02/19/2014   History of Present Illness  Pt. admitted 8/26 with fever and hypotension.  Suspected UTI but cultures pending.  Prior history includes asthma, DM, neuropathy and L TKA.    Clinical Impression  Pt. Presents to PT with generalized weakness from acute illness with decreased independence in her functional mobility and gait.  She will benefit from acute PT to address these and below issues with plan for DC home under her daughter's care and supervision.  Would want confirmation that daughter is agreeable to be care provider, as pt. Is not ready to return home alone safely.  ALSO, pt's O2 sats dropped to 79% on RA with short walk to the bathroom.  Suspect that she will need home O2 at time of DC    Follow Up Recommendations Home health PT;Supervision/Assistance - 24 hour;Supervision for mobility/OOB    Equipment Recommendations  Other (comment) (with decreasing O2 sats, pt. may need home O2)    Recommendations for Other Services OT consult     Precautions / Restrictions Precautions Precautions: Fall (watch O2 sats) Restrictions Weight Bearing Restrictions: No      Mobility  Bed Mobility Overal bed mobility: Needs Assistance Bed Mobility: Supine to Sit     Supine to sit: Supervision     General bed mobility comments: supervision for safetyh and use of bedrail to assist self to sitting.   with pt. at EOB she was unable to reach to put her own socks on and needed full assist for this task.  Transfers Overall transfer level: Needs assistance Equipment used: Rolling walker (2 wheeled) Transfers: Sit to/from Stand Sit to Stand: Min assist         General transfer comment: min assist to rise to stand and to control descent; tends to have decreased sitting control even withuse of UEs on chair  Ambulation/Gait Ambulation/Gait assistance: Min assist Ambulation  Distance (Feet): 25 Feet Assistive device: Rolling walker (2 wheeled) Gait Pattern/deviations: Step-through pattern;Shuffle;Trunk flexed;Narrow base of support Gait velocity: decreased   General Gait Details: pt. needed vc's for upright posture, min assist for safety and stability.  Pt. ambulated into bathroom for urination then back across room to recliner chair  Stairs            Wheelchair Mobility    Modified Rankin (Stroke Patients Only)       Balance Overall balance assessment: Needs assistance Sitting-balance support: No upper extremity supported;Feet supported Sitting balance-Leahy Scale: Good     Standing balance support: Bilateral upper extremity supported;During functional activity Standing balance-Leahy Scale: Poor Standing balance comment: needed support of RW for upright stance and stability                             Pertinent Vitals/Pain Pain Assessment: No/denies pain  O2 at rest on 3 L O2  = 97% O2 with activity on RA  = 79% O2 at end of activity on 3  L O2 = 98%    Home Living Family/patient expects to be discharged to:: Private residence Living Arrangements: Alone Available Help at Discharge: Family;Available 24 hours/day;Other (Comment) (says daughter can help 24/7; lives across from pt.) Type of Home: Mobile home Home Access: Ramped entrance     Home Layout: One level Home Equipment: Walker - 4 wheels;Bedside commode Additional Comments: says she does not use BSC    Prior Function Level  of Independence: Independent with assistive device(s)         Comments: still drives on a limited basis     Hand Dominance        Extremity/Trunk Assessment   Upper Extremity Assessment: Overall WFL for tasks assessed           Lower Extremity Assessment: Generalized weakness      Cervical / Trunk Assessment: Kyphotic  Communication   Communication: No difficulties  Cognition Arousal/Alertness: Awake/alert Behavior  During Therapy: WFL for tasks assessed/performed Overall Cognitive Status: Within Functional Limits for tasks assessed                      General Comments      Exercises        Assessment/Plan    PT Assessment Patient needs continued PT services  PT Diagnosis Difficulty walking;Generalized weakness   PT Problem List Decreased strength;Decreased activity tolerance;Decreased balance;Decreased mobility;Decreased knowledge of use of DME;Cardiopulmonary status limiting activity;Obesity  PT Treatment Interventions DME instruction;Gait training;Functional mobility training;Therapeutic activities;Therapeutic exercise;Balance training;Patient/family education   PT Goals (Current goals can be found in the Care Plan section) Acute Rehab PT Goals Patient Stated Goal: home and daughter helping her as needed PT Goal Formulation: With patient Time For Goal Achievement: 03/05/14 Potential to Achieve Goals: Good    Frequency Min 3X/week   Barriers to discharge        Co-evaluation               End of Session Equipment Utilized During Treatment: Gait belt Activity Tolerance: Patient limited by fatigue;Other (comment) (limited by decreased O2 sats) Patient left: in chair;with call bell/phone within reach;with chair alarm set Nurse Communication: Mobility status;Other (comment) (drop in sats on RA walking to bathroom)         Time: 7829-5621 PT Time Calculation (min): 32 min   Charges:   PT Evaluation $Initial PT Evaluation Tier I: 1 Procedure PT Treatments $Gait Training: 8-22 mins $Therapeutic Activity: 8-22 mins   PT G Codes:          Ferman Hamming 02/19/2014, 11:57 AM Weldon Picking PT Acute Rehab Services [phone] [place] [phone]

## 2014-02-19 NOTE — H&P (Signed)
FMTS Atending Admit Note Patient seen and examined by me, discussed with resident team and I agree with Dr Nyra Capes assessment and plan with following additions. Briefly, 80yoF who presents with vague complaints of fatigue and weakness, found to be febrile and hypotensive. UA consistent with UTI (nitrite/leuk est positive, many bacteria/WBC with few epithelials).  No acidosis. On exam she is awake, speech is somewhat delayed, is able to state location [facility]") and "August '15". Appears dry on exam (mucus membranes, skin turgor).  Neck supple.  Bradycardic S1S2 Bibasilar crackles on lung exam.  No calf tenderness noted. Trace ankle edema.  Labs and studies reviewed.  A/P: Patient admitted with fever, hypotension; likely UTI. Blood and urine cx are pending. For urine gram stain to try to get better organism ID. Continue on vanc/Levaquin.  NS bolus IVF for pressure support. Mentating clearly now. Hold BB. Agree with cortisol for diagnosis of possible adrenal insufficiency as result of acute illness/infection.   Close follow up.  Paula Compton, MD

## 2014-02-19 NOTE — Progress Notes (Signed)
Inpatient Diabetes Program Recommendations  AACE/ADA: New Consensus Statement on Inpatient Glycemic Control (2013)  Target Ranges:  Prepandial:   less than 140 mg/dL      Peak postprandial:   less than 180 mg/dL (1-2 hours)      Critically ill patients:  140 - 180 mg/dL  Results for TUCKER, STEEDLEY (MRN [id number]) as of 02/19/2014 13:53  Ref. Range 02/19/2014 00:11 02/19/2014 07:42 02/19/2014 12:00  Glucose-Capillary Latest Range: 70-99 mg/dL 409 (H) 811 (H) 914 (H)   Inpatient Diabetes Program Recommendations Insulin - Meal Coverage: consider adding Novolog 4 units TID with meals per Glycemic Control Order-Set Thank you  Piedad Climes BSN, RN,CDE Inpatient Diabetes Coordinator [phone] (team pager)

## 2014-02-19 NOTE — Progress Notes (Signed)
FPTS PGY-3 Interim Note  Pt evaluated at bedside with Dr. Mauricio Po this morning; see his H&P cosign and pending full progress note for today. Pt drowsy but appropriate to questioning / exam and fully oriented. No complaints of SOB, chest pain, LE pain, abdominal pain.  Objective: BP 87/42  Pulse 48  Temp(Src) 97.5 F (36.4 C) (Oral)  Resp 20  Ht 5' (1.524 m)  Wt 229 lb 4.8 oz (104.01 kg)  BMI 44.78 kg/m2  SpO2 98% BP this morning is low but improving slightly with fluid boluses. Bradycardic but peripheral pulses intact and extremities well-perfused. Soft crackles on lung exam at bilateral bases Relatively normal WOB on [place] O2 with no significant LE edema No unilateral swelling / redness / pain  A/P: Complicated cystitis or possibly pyelonephritis but doubt frank sepsis. Drowsy but mentating appropriately. New additions to plan: - continue broad abx, f/u cultures and add urine Gram stain - AM cortisol prior to pulse-dose steroids - push fluids and repeat CXR later today to check for any atelectasis or developing effusion / edema / consolidation  Otherwise continue per H&P, for now.  Bobbye Morton, MD PGY-3, [facility] Family Medicine 02/19/2014, 8:13 AM FPTS Service pager: [phone] (text pages welcome through [facility])

## 2014-02-19 NOTE — ED Provider Notes (Signed)
Medical screening examination/treatment/procedure(s) were conducted as a shared visit with non-physician practitioner(s) and myself.  I personally evaluated the patient during the encounter.   EKG Interpretation   Date/Time:  Wednesday February 18 2014 20:06:33 EDT Ventricular Rate:  91 PR Interval:  176 QRS Duration: 75 QT Interval:  421 QTC Calculation: 518 R Axis:   19 Text Interpretation:  Sinus rhythm Low voltage, precordial leads Prolonged  QT interval Similar to prior Confirmed by Gwendolyn Grant  MD, Myracle Febres ([id number]) on  02/18/2014 8:18:13 PM      Patient here with fever, weakness. BPs soft, improved with fluids. No abdominal pain. Lungs clear. No rashes. Urine shows UTI. Admitted.  Elwin Mocha, MD 02/19/14 [phone]

## 2014-02-19 NOTE — Progress Notes (Signed)
Family Medicine Teaching Service Daily Progress Note Intern Pager: [phone]  Patient name: [name] [name] Medical record number: [id number] Date of birth: 1933/12/21 Age: 80 y.o. Gender: female  Primary Care Provider: [name], MD Consultants: None Code Status: FULL  Pt Overview and Major Events to Date:  8/26 - Pt admitted for management of hypotension, fever, hypoxia, and UTI 8/27- Management of pt's UTI   Assessment and Plan: [name] [name] is a 80 y.o. female presenting with generalized weakness and fatigue, found to be hypoxic with UTI. PMH is significant for HTN, DM2, CKD, asthma, and OSA.   #Complicated cystitis- febrile and hypotensive, but does not meet SIRS criteria (no white count or tachypnea). Abnromal UA c/w UTI, pt denies any urinary symptoms and unusual for UTI alone to cause such high fever and hypoxia. No acute pulmonary changes on admission or repeat CXR. Vanc/levaquin started on admission. BCx/UCx pending. Hypotensive/normotensive since admission, see below. -admit to tele under Dr. Mauricio Po  -f/u blood cultures/Urine cultures and gram stain  -cont vanc, will potentially switch levaquin to cipro for potential pseudomonas coverage in DM patient  -trend fever curve  -abdominal exams  -hold oxybutynin   #AKI on CKD stage iii- Cr baseline around 1.2-1.4; likely 2/2 to some dehydration, esp given borderline pressures on admission. Hypotension may also be 2/2 infectious process as above. Pt is s/p 2.5L NS, mild response. -Continue mIVF , check volume status as this may need to be decreased -hold lasix for now  -avoid nephrotoxic agents, consider stopping PPI  - Cortisol AM level PRIOR to start pf pulse-dose steroids  #Acute on likely chronic resp failure with OSA/OHS- Pt not on home O2 or CPAP, although CPAP recommended in the past. BNP not elevated. Well's score 0 on admission, no signs of DVT. No dyspnea or cough. No acute process on CXR, will monitor for signs of  resp infection. Crackles heard bilaterally on exam today. Pt satting 92-98% on 3-4L [place].  -CPAP overnight as able to tolerate, pt declined last night -O2 to maintain sats >92   #Bradycardia: Last ECHO in 2008, pt bradycardic to 40s ON. Home metoprolol and valsartan held. BNP 258 on admission. - continue to monitor - Repeat ECHO today  #Generalized weakness- potentially related to complex cystitis or as acute precipitant, does have hx of restless leg syndrome as well as PVD, spinal stenosis and OA. -continue home requip, lyrica -PT/OT consult -encourage UOB as pt is able   #DM- 8.2 on 11/19/13; may be contributing to weakness; will repeat A1C here  -cont half of home lantus 50u now  -resistant SSI  -cbgs qhs/ac   #HTN- on valsartan and metoprolol; normotensive to hypotensive here but responded to bolus IVF -hold valsartan and metoprolol in setting of hypotension and bradycardia  #Depression- potentially contributing to above weakness/fatigue  -cont celexa  -touch base with Dr. [name] (seems to know pt fairly well)   Disposition: FPTS under Dr. Mauricio Po  Subjective:  Pt responsive and oriented this am, slightly drowsy. Denied any complaints or SOB and feels "okay" overall. Of note, pt denies most of the symptoms that she was admitted for yesterday.  Objective: Temp:  [97.5 F (36.4 C)-102.7 F (39.3 C)] 97.5 F (36.4 C) (08/27 0507) Pulse Rate:  [46-92] 48 (08/27 0719) Resp:  [13-23] 20 (08/27 0507) BP: (75-124)/(26-92) 87/42 mmHg (08/27 0719) SpO2:  [92 %-98 %] 98 % (08/27 0507) Weight:  [229 lb 4.8 oz (104.01 kg)-235 lb (106.595 kg)] 229 lb 4.8 oz (104.01  kg) (08/27 0018) Physical Exam: General: awake, alert, talkative  Cardiovascular: regular rhythm, bradycardic Respiratory: normal work of breathing on 3L [place], crackles bilaterally to mid-lung fields Abdomen: obese, tympanic, non tender non distended, no CVA tenderness  Extremities: 1+ pitting edema equal and symmetric bilat; DP  intact Skin: chronic bruises, no signs of open wounds or ulcers  Neuro: A&OX3, did not assess motor strength  Laboratory:  Recent Labs Lab 02/18/14 2009  WBC 7.8  HGB 13.2  HCT 41.2  PLT 145*    Recent Labs Lab 02/18/14 2009  NA 137  K 4.7  CL 94*  CO2 27  BUN 26*  CREATININE 1.76*  CALCIUM 9.0  PROT 7.3  BILITOT 0.5  ALKPHOS 117  ALT 19  AST 32  GLUCOSE 297*   AM cortisol: pending  Imaging/Diagnostic Tests: CXR (8/27): Stable left basilar opacity is noted consistent with pleural  effusion or scarring.  Clenton Pare, Med Student 02/19/2014, 8:30 AM  FPTS Upper Level Addendum  S: Few specific complaints, just states she feels "weak." Denies chest pain, leg pain, abdominal pain, SOB, cough.  O: I have reviewed the patient's current medications, labs/imaging, and diagnostic tests. BP 95/36  Pulse 58  Temp(Src) 97.5 F (36.4 C) (Oral)  Resp 18  Ht 5' (1.524 m)  Wt 229 lb 4.8 oz (104.01 kg)  BMI 44.78 kg/m2  SpO2 98% General: elderly female lying in bed, in NAD Cardio: bradycardic, no murmur appreciated Pulm: on 3L [place], crackles at lower lung fields, but WOB normal and speaking in full but short sentences Abdomen: soft, obese; non tender Extremities: trace bilateral edema, distal pulses intact  A/P: 80yo female with generalized weakness and fatigue, hypoxic, with suspected UTI. PMH HTN, DM type II, CKD with AKI, asthma, chronic untreated OSA/OHS  #Urinary tract infection / complicated cystitis, setting of urinary incontinence - suspected based off initial complaints / presentation and UA findings, though few symptoms reported (in general); febrile but no WBC or CVA tenderness so unsure if this represents frank pyelonephritis at this time - continue broad abx for now, f/u blood and urine cultures, urine gram stain - check Legionella and Strep pneumo antigens - hold oxybutynin - trend fevers  #AKI / CKD stage III - baseline Cr ~1.2, now up over 2, likely  prerenal secondary to hypotension and poor PO intake (dehydration) with insensible losses from fever and possibly some component of acute infection, as above - s/p several boluses, continue IVF for now - hold Lasix and ARB  #Hypotension - chronic HTN on valsartan and metoprolol, BP improving with abx and fluids - AM cortisol pending, pulse-dosing steroids for now - monitor and restart home meds as needed / as able  #DM type 2 - A1c 8.2 in May, repeat pending - continue resistant SSI, half home Lantus, CBG's  #Depression - continue Celexa  #FEN/GI: NS  mL/h, carb-modified diet #Prophylaxis: Lovenox  Dispo: management as above; discharge planning pending further stabilization of acute issues and eval for [facility] / placement needs.  I have independently interviewed and examined this patient in conjunction with Mrs. Gurabo Callas, MS4, whose note is documented above. The above addendum reflects my independent interview, exam, and assessment/plan. Please see also my earlier interim note and any attending notes.   Bobbye Morton, MD  PGY-3, [facility] Family Medicine  02/19/2014, 12:12 PM FPTS Service Pager: [phone] (text pages welcome through [facility], password "[email or web address]")

## 2014-02-19 NOTE — Progress Notes (Signed)
New Admission Note:  Arrival Method: via stretcher with nurse Tech Mental Orientation: Alert and orientedx4 Telemetry: Placed on box 1, notified CCMD Assessment: Completed Skin: Ecchymosis on bilateral arms, hands, scabs (left thigh), rash under breast and abdominal folds and groin area Pain: denies any pain Tubes: N/A Safety Measures: Safety Fall Prevention Plan was given, discussed and signed. Admission: Completed [address] Orientation: Patient has been orientated to the room, unit and the staff. Family: none at bedside  Orders have been reviewed and implemented. Will continue to monitor the patient. Call light has been placed within reach and bed alarm has been activated.   Tempie Donning BSN, RN  Phone Number: [phone] [facility] [address] [place] Med/Surg-Renal Unit

## 2014-02-19 NOTE — Progress Notes (Signed)
@   23:40  Patient refused CPAP.  Patient states she does not wear CPAP at home.

## 2014-02-19 NOTE — Progress Notes (Signed)
Utilization review completed.  

## 2014-02-20 ENCOUNTER — Inpatient Hospital Stay (HOSPITAL_COMMUNITY): Payer: PRIVATE HEALTH INSURANCE

## 2014-02-20 DIAGNOSIS — Z6841 Body Mass Index (BMI) 40.0 and over, adult: Secondary | ICD-10-CM

## 2014-02-20 DIAGNOSIS — N10 Acute tubulo-interstitial nephritis: Secondary | ICD-10-CM

## 2014-02-20 DIAGNOSIS — I369 Nonrheumatic tricuspid valve disorder, unspecified: Secondary | ICD-10-CM

## 2014-02-20 LAB — BASIC METABOLIC PANEL
Anion gap: 11 (ref 5–15)
BUN: 35 mg/dL — AB (ref 6–23)
CALCIUM: 8.2 mg/dL — AB (ref 8.4–10.5)
CHLORIDE: 102 meq/L (ref 96–112)
CO2: 25 mEq/L (ref 19–32)
CREATININE: 1.82 mg/dL — AB (ref 0.50–1.10)
GFR, EST AFRICAN AMERICAN: 30 mL/min — AB (ref >90)
GFR, EST NON AFRICAN AMERICAN: 25 mL/min — AB (ref >90)
Glucose, Bld: 256 mg/dL — ABNORMAL HIGH (ref 70–99)
Potassium: 5.2 mEq/L (ref 3.7–5.3)
Sodium: 138 mEq/L (ref 137–147)

## 2014-02-20 LAB — GLUCOSE, CAPILLARY
GLUCOSE-CAPILLARY: 309 mg/dL — AB (ref 70–99)
Glucose-Capillary: 272 mg/dL — ABNORMAL HIGH (ref 70–99)
Glucose-Capillary: 253 mg/dL — ABNORMAL HIGH (ref 70–99)

## 2014-02-20 LAB — LEGIONELLA ANTIGEN, URINE: LEGIONELLA ANTIGEN, URINE: NEGATIVE

## 2014-02-20 LAB — CORTISOL-AM, BLOOD: CORTISOL - AM: 40.4 ug/dL — AB (ref 4.3–22.4)

## 2014-02-20 LAB — STREP PNEUMONIAE URINARY ANTIGEN: Strep Pneumo Urinary Antigen: NEGATIVE

## 2014-02-20 MED ORDER — ASPIRIN 325 MG PO TABS
325.0000 mg | ORAL_TABLET | Freq: Every day | ORAL | Status: DC
  Administered 2014-02-20: 325 mg via ORAL
  Filled 2014-02-20: qty 1

## 2014-02-20 MED ORDER — INSULIN ASPART 100 UNIT/ML ~~LOC~~ SOLN
10.0000 [IU] | Freq: Three times a day (TID) | SUBCUTANEOUS | Status: DC
  Administered 2014-02-20 (×2): 10 [IU] via SUBCUTANEOUS

## 2014-02-20 MED ORDER — CIPROFLOXACIN HCL 500 MG PO TABS: 500.0000 mg | ORAL_TABLET | Freq: Every day | ORAL | Status: DC

## 2014-02-20 MED ORDER — CIPROFLOXACIN HCL 500 MG PO TABS
500.0000 mg | ORAL_TABLET | Freq: Every day | ORAL | Status: DC
  Administered 2014-02-20: 500 mg via ORAL
  Filled 2014-02-20: qty 1

## 2014-02-20 MED ORDER — CARVEDILOL 3.125 MG PO TABS: 3.1250 mg | ORAL_TABLET | Freq: Two times a day (BID) | ORAL | Status: DC

## 2014-02-20 NOTE — Progress Notes (Signed)
Pt desaturates to 83 % on RA. Pt placed back on 2 /m via [place]. Oxygen saturation returns to 88 %. Pt increased to 3 l/m via [place] and oxygen saturation returns to 93% with no signs and symptoms of distress. Dondra Spry, RN

## 2014-02-20 NOTE — Progress Notes (Signed)
SATURATION QUALIFICATIONS: (This note is used to comply with regulatory documentation for home oxygen)  Patient Saturations on Room Air at Rest = 86%  Patient Saturations on Room Air while Ambulating = 84%  Patient Saturations on 2 Liters of oxygen at rest = 100%  Please briefly explain why patient needs home oxygen: to keep O2 sat up  Peri Maris, MBA, BS, RN

## 2014-02-20 NOTE — Progress Notes (Signed)
Family Medicine Teaching Service Daily Progress Note Intern Pager: [phone]  Patient name: [name] [name] Medical record number: [id number] Date of birth: 07/26/1933 Age: 80 y.o. Gender: female  Primary Care Provider: [name], MD Consultants: None Code Status: FULL  Pt Overview and Major Events to Date:  8/26 - Pt admitted for management of hypotension, fever, hypoxia, and UTI 8/27- Management of pt's UTI  8/28 - Management of pt's UTI; attempt to wean O2  Assessment and Plan: ILIANY [name] is a 80 y.o. female presenting with generalized weakness and fatigue, found to be hypoxic with UTI. PMH is significant for HTN, DM2, CKD, asthma, and OSA.   #Complicated cystitis- febrile and hypotensive on admission, but did not meet SIRS criteria (no white count or tachypnea). Abnormal UA c/w UTI. No acute pulmonary changes on admission or repeat CXR. Vanc/levaquin started on admission. BCx: no growth x2days. UCx: E.coli. BP improving, no longer hypotensive. Afebrile since admission. Strep pneumo negative, Legionella pending. Cortisol wnl. -f/u E.coli sensitivities and BCx -d'c vanc, monotherapy w/ levaquin until E.coli sensitivities return -hold oxybutynin   #AKI on CKD stage iii- Cr increasing, 2.13 (8/27), baseline around 1.2-1.4; likely 2/2 to some dehydration and hypotension. Hypotension resolved and s/p IVF rehydration. Cortisol level wnl. - continue to hold lasix for now  -avoid nephrotoxic agents, consider stopping PPI  - repeat BMP later today  #Acute on likely chronic resp failure with OSA/OHS- Pt not on home O2 or CPAP, although CPAP recommended in the past. BNP not elevated. No dyspnea or cough. No acute process on CXR, will monitor for signs of resp infection. Crackles heard bilaterally on exam today. Pt desatted to 83% on RA, placed on 3L [place].  -CPAP overnight as able to tolerate, pt declined last night again -O2 to maintain sats >92, attempt to wean off of O2 today -ambulate to  declare need for O2  #Bradycardia: Last ECHO in 2008, pt bradycardic to 40s ON. Home metoprolol and valsartan held. BNP 258 on admission. - continue to monitor - Repeat ECHO today if possible  #Generalized weakness- potentially related to complex cystitis or as acute precipitant, does have hx of restless leg syndrome as well as PVD, spinal stenosis and OA. PT/OT recommended home nursing with 24-hour assistance -continue home requip, lyrica - CSW consult- HHF2F -encourage UOB as pt is able  - Contact re: home health and pt's increased PT needs  #DM- A1c 8.3 (8/27). CBG 336 this am. Pt on home lantus BID. -cont half of home lantus 50u   - Add 10U lantus with meals -cbgs qhs/ac   #HTN- on valsartan and metoprolol; normotensive to hypotensive here but responded to bolus IVF -hold valsartan and metoprolol in setting of hypotension and bradycardia  #Depression- potentially contributing to above weakness/fatigue  -cont celexa  -touch base with Dr. [name] (seems to know pt fairly well)   #Morbid obesity: BMI of 44.9 -would encourage weight loss for improved management of co-morbid conditions   Disposition: pending O2 requirement, home health services and sensitivities of cultures  Subjective:  Pt more responsive and oriented this am. Continues to deny any complaints or SOB and feels "okay" overall. Pt reports that her daughter lives across the street from her and would be able to check in regularly.  Objective: Temp:  [97.8 F (36.6 C)-98.2 F (36.8 C)] 98.2 F (36.8 C) (08/28 0444) Pulse Rate:  [66-72] 72 (08/28 0444) Resp:  [17-18] 18 (08/28 0444) BP: (123-147)/(45-54) 147/53 mmHg (08/28 0444) SpO2:  [83 %-  98 %] 97 % (08/28 0444) Physical Exam: General: awake, alert, talkative  Cardiovascular: regular rate and rhythm Respiratory: normal work of breathing on 3L [place], crackles bilaterally to mid-lung fields Abdomen: obese, tympanic, non tender non distended, no CVA tenderness   Extremities: 1+ pitting edema equal and symmetric bilat; DP intact Skin: chronic bruises, no signs of open wounds or ulcers  Neuro: A&OX3, did not assess motor strength  Laboratory:  Recent Labs Lab 02/18/14 2009 02/19/14 0821  WBC 7.8 5.8  HGB 13.2 11.0*  HCT 41.2 35.7*  PLT 145* 121*    Recent Labs Lab 02/18/14 2009 02/19/14 0821  NA 137 141  K 4.7 4.5  CL 94* 103  CO2 27 26  BUN 26* 30*  CREATININE 1.76* 2.13*  CALCIUM 9.0 7.3*  PROT 7.3  --   BILITOT 0.5  --   ALKPHOS 117  --   ALT 19  --   AST 32  --   GLUCOSE 297* 158*   AM cortisol: pending  Imaging/Diagnostic Tests: CXR (8/28): Small left effusion with bibasilar atelectasis  Clenton Pare, Med Student 02/20/2014, 9:29 AM  Above note written jointly by myself and Myrtis Ser, MS4. Edited to reflect these changes Anselm Lis, MD  PGY-2, [facility] Family Medicine  FPTS Service Pager: [phone] (text pages welcome through AMION, password "[email or web address]")

## 2014-02-20 NOTE — Discharge Summary (Signed)
Family Medicine Teaching [facility] Discharge Summary  Patient name: [name] [name] Medical record number: [id number] Date of birth: 1933-12-09 Age: 80 y.o. Gender: female Date of Admission: 02/18/2014  Date of Discharge: 02/21/14 Admitting Physician: Leighton Roach McDiarmid, MD  Primary Care Provider: [name], MD Consultants: None  Indication for Hospitalization: Hypoxia, hypotension in setting of UTI  Discharge Diagnoses/Problem List: UTI, hypoxia Problem list: AKI on CKD OSA/OHS Bradycardia (resolved) Hypotension (resolved) HTN Generalized weakness DM, poorly controlled  Depression Morbid obesity  Disposition: Home w/ home health  Discharge Condition: Stable, improved  Discharge Exam:  BP 115/43  Pulse 64  Temp(Src) 97.6 F (36.4 C) (Oral)  Resp 18  Ht 5' (1.524 m)  Wt 229 lb 4.8 oz (104.01 kg)  BMI 44.78 kg/m2  SpO2 96% General: awake, alert, talkative  Cardiovascular: regular rate and rhythm  Respiratory: normal work of breathing on 3L [place], bibasilar crackles Abdomen: obese, tympanic, non tender non distended, no CVA tenderness  Extremities: 1+ pitting edema equal and symmetric bilat; DP intact  Skin: chronic bruises, no signs of open wounds or ulcers  Neuro: A&OX3, did not assess motor strength  Brief Hospital Course:  [name] [name] is a 80 y.o. female with a PMH significant for HTN, DM2, CKD, asthma, and OSA who p/w generalized weakness and fatigue, found to be hypoxic with UTI. On admission, pt was also febrile and hypotensive and remained so for 24 hours. She was rehydrated with IVF and started on broad spectrum vanc/levaquin. UCx grew E.coli, no growth to date on BCx. Negative strep pneumo and legionella, normal am cortisol.  On day prior to d'c, pt was switched to cipro and dc'd on this for a total course of 7 days (end date: 02/24/14).   CXR showed bibasilar atelectasis, stable left basilar opacity, no acute process and EKG showed NSR w/ prolonged QT.  Although pro-BNP was wnl for pt's age, due to pt's persistent hypotension and bradycardia an ECHO was completed and showed an EF of 65-70%, Grade 1 diastolic dysfunction. Pt was switched from toprolol XR to coreg 3.125 given concerns for further bradycardia but concern for abruptly withdrawing BB.    With regards to pt's hypoxia, she denied any dyspnea or increased WOB prior to and during her hospitalization. She required 3L of O2 to remain >92%, desatted to 79-82% when weaned to RA. As above, CXR and EKG showed no signs of acute cardiopulmonary process. Patient's hypoxia is most likely chronic (OSA/OHS, requires home CPAP and nightly O2, has not been using these), acutely worsened by UTI. She was dc'd with home O2 and was provided with information regarding the importance of CPAP and need for home O2. Due to pt's O2 reqs and generalized weakness, home health was set up for pt as recommended by PT, in addition to assistance from daughter. This plan was discussed with pt and daughter and both are in agreement with this plan.   Issues for Follow Up:  1. Complicated cystitis: Pt denied urinary symptoms on admission but had an abnormal UA and UCx showing E.coli. She was started on cipro for a total of 7 day course (d'c 02/24/14). 2. Hypoxia/OSA/OHS: It was previously recommended that pt use CPAP and nightly O2, however she denies using this. She also denies hypoxia at baseline, but while attempting to wean her off of O2, she desatted to 79-83%. She was dc'd on home O2. I spoke to pt's daughter and discussed the need for O2 and CPAP and she agreed to ensure  that her mother will use this. She has reportedly not used a CPAP in years and may need to be re-fitted for one.Outpt sleep study 3. AKI: On admission, pt's Cr was 1.76, increased from baseline of 1.2-1.3. Cr increased to 2.13 on 8/28, repeat on day of d'c was 1.82. Most likely AKI due to dehydration, but close follow-up would be best. 4. Bradycardia: Pt's pulse  decreased to 40s while inpt. EKG: NSR, with prolonged QT. Pro-BNP mildly increased (but nl for pt's age) at 16. Repeat ECHO showed EF: 65-70%, Grade 1 diastolic dysfunction. Pt currently on metoprolol and valsartan, if this is recurrent, may need to re-address this. Held on discharge. Switched to carvedilol 3.125 5. Poorly controlled DM: Pt had elevated CBGs (200s-300s). A1c on 8/27 8.3. She did receive one dose of steroids (for persistent hypotension) and her home insulin dose was halved. She was dc'd on her home insulin regimen but this may need to be re-adjusted.   Significant Procedures: None  Significant Labs and Imaging:   Recent Labs Lab 02/18/14 2009 02/19/14 0821  WBC 7.8 5.8  HGB 13.2 11.0*  HCT 41.2 35.7*  PLT 145* 121*    Recent Labs Lab 02/18/14 2009 02/19/14 0821  NA 137 141  K 4.7 4.5  CL 94* 103  CO2 27 26  GLUCOSE 297* 158*  BUN 26* 30*  CREATININE 1.76* 2.13*  CALCIUM 9.0 7.3*  ALKPHOS 117  --   AST 32  --   ALT 19  --   ALBUMIN 3.3*  --    CXR: bibasilar atelectasis, stable left basilar opacity, no acute process  Echocardiogram (8/27): EF: 65-70%, Grade 1 diastolic dysfunction  EKG: NSR, prolonged QT  Results/Tests Pending at Time of Discharge: None  Discharge Medications:    Medication List    ASK your doctor about these medications       allopurinol 300 MG tablet  Commonly known as:  ZYLOPRIM  Take 300 mg by mouth daily.     aspirin EC 81 MG tablet  Take 81 mg by mouth daily.     citalopram 20 MG tablet  Commonly known as:  CELEXA  Take 20 mg by mouth daily.     fluticasone 50 MCG/ACT nasal spray  Commonly known as:  FLONASE  Place 2 sprays into both nostrils daily as needed for allergies or rhinitis.     furosemide 40 MG tablet  Commonly known as:  LASIX  Take 40-80 mg by mouth 2 (two) times daily. 80 mg in AM and 40 mg at 2 PM     LANTUS SOLOSTAR 100 UNIT/ML Solostar Pen  Generic drug:  Insulin Glargine  Inject 50 Units into  the skin 2 (two) times daily.     metoprolol succinate 25 MG 24 hr tablet  Commonly known as:  TOPROL-XL  Take 25 mg by mouth daily.     multivitamin with minerals Tabs tablet  Take 1 tablet by mouth daily.     oxybutynin 5 MG tablet  Commonly known as:  DITROPAN  Take 2.5 mg by mouth 2 (two) times daily.     pregabalin 150 MG capsule  Commonly known as:  LYRICA  Take 150 mg by mouth at bedtime.     rOPINIRole 4 MG tablet  Commonly known as:  REQUIP  Take 4 mg by mouth at bedtime.     simvastatin 20 MG tablet  Commonly known as:  ZOCOR  Take 20 mg by mouth daily.  Discharge Instructions: Please refer to Patient Instructions section of EMR for full details.  Patient was counseled important signs and symptoms that should prompt return to medical care, changes in medications, dietary instructions, activity restrictions, and follow up appointments.   Follow-Up Appointments: Follow-up Information   Follow up with [name], MD On 02/25/2014. (An appointment was scheduled for you on Wednesday, 02/25/14, at 11am for a hospital follow-up)    Specialties:  Family Medicine, Sports Medicine   Contact information:   [address] [place] [address] [phone]       Clenton Pare, Med Student 02/20/2014, 2:29 PM Charlane Ferretti, MD Family Medicine PGY-2 Please page or call with questions

## 2014-02-20 NOTE — Progress Notes (Signed)
Inpatient Diabetes Program Recommendations  AACE/ADA: New Consensus Statement on Inpatient Glycemic Control (2013)  Target Ranges:  Prepandial:   less than 140 mg/dL      Peak postprandial:   less than 180 mg/dL (1-2 hours)      Critically ill patients:  140 - 180 mg/dL  Results for [name] [name], [name] [name] (MRN [id number]) as of 02/20/2014 12:42  Ref. Range 02/19/2014 07:42 02/19/2014 12:00 02/19/2014 16:27 02/19/2014 21:40 02/20/2014 08:27  Glucose-Capillary Latest Range: 70-99 mg/dL 536 (H) 644 (H) 034 (H) 336 (H) 309 (H)   Consider increasing Lantus to 25 units a.m. And continue 50 units HS.  Home dose is Lantus 50 units BID.  Thank you  Piedad Climes BSN, RN,CDE Inpatient Diabetes Coordinator [phone] (team pager)

## 2014-02-20 NOTE — Care Management Note (Signed)
CARE MANAGEMENT NOTE 02/20/2014  Patient:  [name] [name], [name] [name]   Account Number:  [id number]  Date Initiated:  02/20/2014  Documentation initiated by:  Nasier Thumm  Subjective/Objective Assessment:   CM following for progression and d/c planning.     Action/Plan:   02/20/2014 Met with pt re HH needs, [facility] selected for HHPT and HHOT. Pt has a rolling walker. Await eval for possible home oxygen needs.   Anticipated DC Date:     Anticipated DC Plan:  [place]         Choice offered to / List presented to:  C-1 Patient        [place] arranged  [place].   Status of service:  Completed, signed off Medicare Important Message given?  YES (If response is "NO", the following Medicare IM given date fields will be blank) Date Medicare IM given:  02/20/2014 Medicare IM given by:  Sirius [place] Date Additional Medicare IM given:   Additional Medicare IM given by:    Discharge Disposition:  [place]  Per UR Regulation:    If discussed at Long Length of Stay Meetings, dates discussed:    Comments:  02/20/2014 Pt states that she has a rolling walker, lives near daughter who checks on her frequenty thur the day. Currently on O2 following for possible need for home oxygen.   CRoyal RN MPH, case manager, [phone]

## 2014-02-20 NOTE — Clinical Documentation Improvement (Signed)
Clarify clinical condiion:  Acute on likely chronic resp failure with OSA/OHS- BMI=44.9  _____Morbid Obesity/with BMI= _____Overweight  Other condition___________________  Cannot clinically determine _____________  Risk Factors: DM, HTN, OSA/OHS  Treatment : Diet Carb Modified , calorie level:medium 1600-2000 Monitor  Safety precaution,  Thank You, Andy Gauss ,RN Clinical Documentation Specialist:  [phone]  [facility]- Health Information Management

## 2014-02-20 NOTE — Progress Notes (Signed)
Pt. discharged to home. Daughters at bedside. Pt after visit summary reviewed with daughters and pt. Pt capable of re verbalizing medications and follow up appointments. Pt remains stable. No signs and symptoms of distress. Educated to return to ER in the event of SOB, dizziness, chest pain, or fainting. Burley Saver, RN

## 2014-02-20 NOTE — Progress Notes (Signed)
Echocardiogram 2D Echocardiogram has been performed.  Oakley Kossman 02/20/2014, 11:10 AM

## 2014-02-20 NOTE — Discharge Instructions (Signed)
Ms [place]one[place], Silverstein were seen in the hospital for a UTI and for breathing difficulties.  We treated you with broad antibiotics and switched you over to the oral form of these medications when we knew what type of bacteria was growing in your urine.  We also noticed you needed oxygen while you were here.  We have set you up with both oxygen and home health services.  You should probably also have a repeat sleep study to requalify for CPAP at night time.  Should you experience worsening shortness of breath, chest pain or more confusion please call 911 immediately as these could be signs of a medical emergency  Feel better soon! Thanks for letting us take care of you.

## 2014-02-20 NOTE — Progress Notes (Signed)
ANTIBIOTIC CONSULT NOTE - FOLLOW UP  Pharmacy Consult for Levaquin Indication: UTI  Allergies  Allergen Reactions  . Clindamycin/Lincomycin Rash and Other (See Comments)    Itchy rash and burned stomach  . Tramadol Other (See Comments)    Tremors, possible mild serotonin syndrome  . Cephalexin Diarrhea, Nausea And Vomiting and Other (See Comments)    oral ulcers  . Codeine Itching and Nausea And Vomiting  . Enalapril Maleate Hives, Swelling and Other (See Comments)    blisters on lips  . Mirtazapine Other (See Comments)    edema    Patient Measurements: Height: 5' (152.4 cm) Weight: 229 lb 4.8 oz (104.01 kg) IBW/kg (Calculated) : 45.5 Adjusted Body Weight:   Vital Signs: Temp: 98.2 F (36.8 C) (08/28 0444) Temp src: Oral (08/28 0444) BP: 147/53 mmHg (08/28 0444) Pulse Rate: 72 (08/28 0444) Intake/Output from previous day: 08/27 0701 - 08/28 0700 In: 240 [P.O.:240] Out: -  Intake/Output from this shift:    Labs:  Recent Labs  02/18/14 2009 02/19/14 0821  WBC 7.8 5.8  HGB 13.2 11.0*  PLT 145* 121*  CREATININE 1.76* 2.13*   Estimated Creatinine Clearance: 23.7 ml/min (by C-G formula based on Cr of 2.13). No results found for this basename: VANCOTROUGH, VANCOPEAK, VANCORANDOM, GENTTROUGH, GENTPEAK, GENTRANDOM, TOBRATROUGH, TOBRAPEAK, TOBRARND, AMIKACINPEAK, AMIKACINTROU, AMIKACIN,  in the last 72 hours   Microbiology: Recent Results (from the past 720 hour(s))  CULTURE, BLOOD (ROUTINE X 2)     Status: None   Collection Time    02/18/14  8:29 PM      Result Value Ref Range Status   Specimen Description BLOOD HAND RIGHT   Final   Special Requests BOTTLES DRAWN AEROBIC AND ANAEROBIC 10CC   Final   Culture  Setup Time     Final   Value: 02/19/2014 01:36     Performed at [facility]   Culture     Final   Value:        BLOOD CULTURE RECEIVED NO GROWTH TO DATE CULTURE WILL BE HELD FOR 5 DAYS BEFORE ISSUING A FINAL NEGATIVE REPORT     Performed at  [facility]   Report Status PENDING   Incomplete  CULTURE, BLOOD (ROUTINE X 2)     Status: None   Collection Time    02/18/14  8:52 PM      Result Value Ref Range Status   Specimen Description BLOOD HAND LEFT   Final   Special Requests BOTTLES DRAWN AEROBIC AND ANAEROBIC 10CC EACH   Final   Culture  Setup Time     Final   Value: 02/19/2014 01:35     Performed at [facility]   Culture     Final   Value:        BLOOD CULTURE RECEIVED NO GROWTH TO DATE CULTURE WILL BE HELD FOR 5 DAYS BEFORE ISSUING A FINAL NEGATIVE REPORT     Performed at [facility]   Report Status PENDING   Incomplete  URINE CULTURE     Status: None   Collection Time    02/18/14  9:54 PM      Result Value Ref Range Status   Specimen Description URINE, CATHETERIZED   Final   Special Requests NONE   Final   Culture  Setup Time     Final   Value: 02/19/2014 03:41     Performed at [facility] Count     Final  Value: >=100,000 COLONIES/ML     Performed at [facility]   Culture     Final   Value: ESCHERICHIA COLI     Performed at [facility]   Report Status PENDING   Incomplete  GRAM STAIN     Status: None   Collection Time    02/19/14  6:37 PM      Result Value Ref Range Status   Specimen Description URINE, RANDOM   Final   Special Requests Normal   Final   Gram Stain     Final   Value: CYTOSPIN SLIDE     WBC PRESENT, PREDOMINANTLY MONONUCLEAR     NO ORGANISMS SEEN   Report Status 02/19/2014 FINAL   Final    Anti-infectives   Start     Dose/Rate Route Frequency Ordered Stop   02/20/14 2100  levofloxacin (LEVAQUIN) IVPB 750 mg     750 mg 100 mL/hr over 90 Minutes Intravenous Every 48 hours 02/18/14 2140     02/20/14 1800  vancomycin (VANCOCIN) 1,500 mg in sodium chloride 0.9 % 500 mL IVPB  Status:  Discontinued     1,500 mg 250 mL/hr over 120 Minutes Intravenous Every 48 hours 02/19/14 1321 02/20/14 0830   02/19/14 2100  vancomycin (VANCOCIN)  1,500 mg in sodium chloride 0.9 % 500 mL IVPB  Status:  Discontinued     1,500 mg 250 mL/hr over 120 Minutes Intravenous Every 24 hours 02/18/14 2140 02/19/14 1321   02/18/14 2245  aztreonam (AZACTAM) 2 g in dextrose 5 % 50 mL IVPB  Status:  Discontinued     2 g 100 mL/hr over 30 Minutes Intravenous  Once 02/18/14 2239 02/18/14 2240   02/18/14 2045  vancomycin (VANCOCIN) 2,000 mg in sodium chloride 0.9 % 500 mL IVPB     2,000 mg 250 mL/hr over 120 Minutes Intravenous  Once 02/18/14 2039 02/18/14 2315   02/18/14 2030  levofloxacin (LEVAQUIN) IVPB 750 mg     750 mg 100 mL/hr over 90 Minutes Intravenous  Once 02/18/14 2020 02/18/14 2302   02/18/14 2030  vancomycin (VANCOCIN) IVPB 1000 mg/200 mL premix  Status:  Discontinued     1,000 mg 200 mL/hr over 60 Minutes Intravenous  Once 02/18/14 2020 02/18/14 2039      Assessment: 80yof on Levaquin Day 2 for Ecoli UTI and r/o PNA. Vancomycin was discontinue 8/28. Patient is afebrile and WBC wnl. - SCr 2.13, Crcl 24 ml/min   Plan:  1. Continue Levaquin  IV q48h 2. Follow-up Ecoli sensitivities and narrow antibiotic regimen 3. Follow-up renal function and adjust as indicated  [name] [name] [phone] 02/20/2014,9:26 AM

## 2014-02-20 NOTE — Progress Notes (Signed)
Physical Therapy Treatment Patient Details Name: [name] [name] MRN: [id number] DOB: October 02, 1933 Today's Date: 02/20/2014    History of Present Illness Pt. admitted 8/26 with fever and hypotension.  Suspected UTI but cultures pending.  Prior history includes asthma, DM, neuropathy and L TKA.      PT Comments    Patient progressing well with mobility and able to improve ambulation distance today however most likely will require supplemental home 02 as pt not able to maintain Sa02 >92% at rest on RA. Pt required 3L 02 [place] during gait training and continued to desat to 87%. Able to recover quickly with cues for pursed lip breathing. Pt asymptomatic. Pt requires assist with bed mobility and min guard for ambulation due to mild unsteadiness. Recommend supervision from daughter at home. Will continue to follow and progress.   Follow Up Recommendations  Home health PT;Supervision/Assistance - 24 hour;Supervision for mobility/OOB     Equipment Recommendations  Other (comment) (Pt needs home02.)    Recommendations for Other Services       Precautions / Restrictions Precautions Precautions: Fall Precaution Comments: keep sats above 92%, monitor 02. Restrictions Weight Bearing Restrictions: No    Mobility  Bed Mobility Overal bed mobility: Needs Assistance Bed Mobility: Rolling;Sidelying to Sit Rolling: Min assist Sidelying to sit: Min assist       General bed mobility comments: HOB flat, no rails to simulate bed at home. Required VC for sequencing and technique. Increased time and Min A to roll/elevate trunk to sitting. unable to donn socks, total A. Sa02 sitting EOB on RA 85%, asymptomatic. VC for pursed lip breating however unable to recover. Donned 2L 02, Sa02 improved to 87%, increased to 3L, Sa02 >93%.  Transfers Overall transfer level: Needs assistance Equipment used: Rolling walker (2 wheeled) Transfers: Sit to/from Stand Sit to Stand: Min assist         General transfer  comment: Min A to rise to stand.  Ambulation/Gait Ambulation/Gait assistance: Min guard Ambulation Distance (Feet): 150 Feet Assistive device: Rolling walker (2 wheeled) Gait Pattern/deviations: Step-through pattern;Decreased stride length;Trunk flexed Gait velocity: 1.1 ft/sec Gait velocity interpretation: <1.8 ft/sec, indicative of risk for recurrent falls General Gait Details: Ambulated on 3L 02 [place]. Min guard for safety due to mild unsteadiness. No SOB noted/reported. Sa02 dropped to 87% on 3L 02 [place] during gait training. VC for pursed lip breathing. No symptoms of dizziness.   Stairs            Wheelchair Mobility    Modified Rankin (Stroke Patients Only)       Balance Overall balance assessment: Needs assistance   Sitting balance-Leahy Scale: Good       Standing balance-Leahy Scale: Poor Standing balance comment: Needed support of RW for static and dynamic standing due to balance deficits. Able to perform grooming at sink with 1 UE for support with no LOB.                    Cognition Arousal/Alertness: Awake/alert Behavior During Therapy: WFL for tasks assessed/performed Overall Cognitive Status: Within Functional Limits for tasks assessed                      Exercises      General Comments        Pertinent Vitals/Pain Pain Assessment: No/denies pain    Home Living                      Prior  Function            PT Goals (current goals can now be found in the care plan section) Progress towards PT goals: Progressing toward goals    Frequency       PT Plan Current plan remains appropriate    Co-evaluation             End of Session Equipment Utilized During Treatment: Gait belt;Oxygen Activity Tolerance: Patient tolerated treatment well (decrease in 02 sats) Patient left: in chair;with call bell/phone within reach;with chair alarm set     Time: 1235-1300 PT Time Calculation (min): 25 min  Charges:  $Gait  Training: 23-37 mins                    G CodesAlvie Heidelberg A 04-Mar-2014, 1:57 PM Alvie Heidelberg, PT, DPT [phone]

## 2014-02-20 NOTE — Progress Notes (Signed)
I have seen and examined this patient. I have discussed with Dr Marsh.  I agree with their findings and plans as documented in their progress note.    

## 2014-02-21 ENCOUNTER — Encounter (HOSPITAL_COMMUNITY): Payer: Self-pay | Admitting: Emergency Medicine

## 2014-02-21 ENCOUNTER — Emergency Department (HOSPITAL_COMMUNITY): Payer: PRIVATE HEALTH INSURANCE

## 2014-02-21 ENCOUNTER — Observation Stay (HOSPITAL_COMMUNITY)
Admission: EM | Admit: 2014-02-21 | Discharge: 2014-02-23 | Disposition: A | Discharge status: Home / Self Care | Payer: PRIVATE HEALTH INSURANCE | Attending: Family Medicine | Admitting: Family Medicine

## 2014-02-21 DIAGNOSIS — M1710 Unilateral primary osteoarthritis, unspecified knee: Secondary | ICD-10-CM | POA: Diagnosis not present

## 2014-02-21 DIAGNOSIS — R404 Transient alteration of awareness: Secondary | ICD-10-CM | POA: Diagnosis present

## 2014-02-21 DIAGNOSIS — Z7982 Long term (current) use of aspirin: Secondary | ICD-10-CM | POA: Diagnosis not present

## 2014-02-21 DIAGNOSIS — M4802 Spinal stenosis, cervical region: Secondary | ICD-10-CM | POA: Insufficient documentation

## 2014-02-21 DIAGNOSIS — K219 Gastro-esophageal reflux disease without esophagitis: Secondary | ICD-10-CM | POA: Diagnosis not present

## 2014-02-21 DIAGNOSIS — Z96659 Presence of unspecified artificial knee joint: Secondary | ICD-10-CM | POA: Diagnosis not present

## 2014-02-21 DIAGNOSIS — G4733 Obstructive sleep apnea (adult) (pediatric): Secondary | ICD-10-CM | POA: Diagnosis not present

## 2014-02-21 DIAGNOSIS — F329 Major depressive disorder, single episode, unspecified: Secondary | ICD-10-CM | POA: Diagnosis not present

## 2014-02-21 DIAGNOSIS — I872 Venous insufficiency (chronic) (peripheral): Secondary | ICD-10-CM | POA: Insufficient documentation

## 2014-02-21 DIAGNOSIS — N183 Chronic kidney disease, stage 3 unspecified: Secondary | ICD-10-CM | POA: Diagnosis not present

## 2014-02-21 DIAGNOSIS — R4182 Altered mental status, unspecified: Secondary | ICD-10-CM

## 2014-02-21 DIAGNOSIS — D510 Vitamin B12 deficiency anemia due to intrinsic factor deficiency: Secondary | ICD-10-CM | POA: Insufficient documentation

## 2014-02-21 DIAGNOSIS — E119 Type 2 diabetes mellitus without complications: Secondary | ICD-10-CM | POA: Diagnosis not present

## 2014-02-21 DIAGNOSIS — G9349 Other encephalopathy: Secondary | ICD-10-CM | POA: Diagnosis not present

## 2014-02-21 DIAGNOSIS — E1149 Type 2 diabetes mellitus with other diabetic neurological complication: Secondary | ICD-10-CM

## 2014-02-21 DIAGNOSIS — N10 Acute tubulo-interstitial nephritis: Secondary | ICD-10-CM | POA: Insufficient documentation

## 2014-02-21 DIAGNOSIS — G2581 Restless legs syndrome: Secondary | ICD-10-CM | POA: Diagnosis not present

## 2014-02-21 DIAGNOSIS — Z79899 Other long term (current) drug therapy: Secondary | ICD-10-CM | POA: Diagnosis not present

## 2014-02-21 DIAGNOSIS — M6580 Other synovitis and tenosynovitis, unspecified site: Secondary | ICD-10-CM | POA: Diagnosis not present

## 2014-02-21 DIAGNOSIS — E559 Vitamin D deficiency, unspecified: Secondary | ICD-10-CM | POA: Insufficient documentation

## 2014-02-21 DIAGNOSIS — M109 Gout, unspecified: Secondary | ICD-10-CM | POA: Insufficient documentation

## 2014-02-21 DIAGNOSIS — Z6841 Body Mass Index (BMI) 40.0 and over, adult: Secondary | ICD-10-CM

## 2014-02-21 DIAGNOSIS — N3090 Cystitis, unspecified without hematuria: Secondary | ICD-10-CM | POA: Insufficient documentation

## 2014-02-21 DIAGNOSIS — E785 Hyperlipidemia, unspecified: Secondary | ICD-10-CM | POA: Insufficient documentation

## 2014-02-21 DIAGNOSIS — R32 Unspecified urinary incontinence: Secondary | ICD-10-CM | POA: Insufficient documentation

## 2014-02-21 DIAGNOSIS — F3289 Other specified depressive episodes: Secondary | ICD-10-CM | POA: Diagnosis not present

## 2014-02-21 DIAGNOSIS — Z794 Long term (current) use of insulin: Secondary | ICD-10-CM | POA: Insufficient documentation

## 2014-02-21 DIAGNOSIS — R269 Unspecified abnormalities of gait and mobility: Secondary | ICD-10-CM

## 2014-02-21 DIAGNOSIS — R410 Disorientation, unspecified: Secondary | ICD-10-CM

## 2014-02-21 DIAGNOSIS — R0902 Hypoxemia: Secondary | ICD-10-CM | POA: Diagnosis not present

## 2014-02-21 DIAGNOSIS — M48061 Spinal stenosis, lumbar region without neurogenic claudication: Secondary | ICD-10-CM | POA: Insufficient documentation

## 2014-02-21 DIAGNOSIS — I10 Essential (primary) hypertension: Secondary | ICD-10-CM

## 2014-02-21 DIAGNOSIS — I129 Hypertensive chronic kidney disease with stage 1 through stage 4 chronic kidney disease, or unspecified chronic kidney disease: Secondary | ICD-10-CM | POA: Insufficient documentation

## 2014-02-21 HISTORY — DX: Heart failure, unspecified: I50.9

## 2014-02-21 HISTORY — DX: Spinal stenosis, cervical region: M48.02

## 2014-02-21 HISTORY — DX: Sleep apnea, unspecified: G47.30

## 2014-02-21 LAB — URINALYSIS, ROUTINE W REFLEX MICROSCOPIC
BILIRUBIN URINE: NEGATIVE
GLUCOSE, UA: NEGATIVE mg/dL
Ketones, ur: NEGATIVE mg/dL
Leukocytes, UA: NEGATIVE
Nitrite: NEGATIVE
Protein, ur: NEGATIVE mg/dL
Specific Gravity, Urine: 1.017 (ref 1.005–1.030)
Urobilinogen, UA: 0.2 mg/dL (ref 0.0–1.0)
pH: 5.5 (ref 5.0–8.0)

## 2014-02-21 LAB — COMPREHENSIVE METABOLIC PANEL
ALK PHOS: 87 U/L (ref 39–117)
ALT: 45 U/L — AB (ref 0–35)
AST: 45 U/L — AB (ref 0–37)
Albumin: 2.7 g/dL — ABNORMAL LOW (ref 3.5–5.2)
Anion gap: 11 (ref 5–15)
BUN: 37 mg/dL — ABNORMAL HIGH (ref 6–23)
CALCIUM: 8.4 mg/dL (ref 8.4–10.5)
CO2: 24 meq/L (ref 19–32)
Chloride: 105 mEq/L (ref 96–112)
Creatinine, Ser: 1.64 mg/dL — ABNORMAL HIGH (ref 0.50–1.10)
GFR calc non Af Amer: 29 mL/min — ABNORMAL LOW (ref >90)
GFR, EST AFRICAN AMERICAN: 33 mL/min — AB (ref >90)
GLUCOSE: 179 mg/dL — AB (ref 70–99)
POTASSIUM: 5.2 meq/L (ref 3.7–5.3)
SODIUM: 140 meq/L (ref 137–147)
Total Bilirubin: <0.2 mg/dL — ABNORMAL LOW (ref 0.3–1.2)
Total Protein: 6.1 g/dL (ref 6.0–8.3)

## 2014-02-21 LAB — CBC
HCT: 34.8 % — ABNORMAL LOW (ref 36.0–46.0)
Hemoglobin: 11 g/dL — ABNORMAL LOW (ref 12.0–15.0)
MCH: 30.7 pg (ref 26.0–34.0)
MCHC: 31.6 g/dL (ref 30.0–36.0)
MCV: 97.2 fL (ref 78.0–100.0)
PLATELETS: 75 10*3/uL — AB (ref 150–400)
RBC: 3.58 MIL/uL — ABNORMAL LOW (ref 3.87–5.11)
RDW: 15.3 % (ref 11.5–15.5)
WBC: 7 10*3/uL (ref 4.0–10.5)

## 2014-02-21 LAB — URINE CULTURE

## 2014-02-21 LAB — URINE MICROSCOPIC-ADD ON

## 2014-02-21 LAB — I-STAT CG4 LACTIC ACID, ED: LACTIC ACID, VENOUS: 0.79 mmol/L (ref 0.5–2.2)

## 2014-02-21 LAB — GLUCOSE, CAPILLARY: GLUCOSE-CAPILLARY: 119 mg/dL — AB (ref 70–99)

## 2014-02-21 MED ORDER — HEPARIN SODIUM (PORCINE) 5000 UNIT/ML IJ SOLN
5000.0000 [IU] | Freq: Three times a day (TID) | INTRAMUSCULAR | Status: DC
  Administered 2014-02-21 – 2014-02-23 (×6): 5000 [IU] via SUBCUTANEOUS
  Filled 2014-02-21 (×9): qty 1

## 2014-02-21 MED ORDER — INSULIN GLARGINE 100 UNIT/ML ~~LOC~~ SOLN
25.0000 [IU] | Freq: Two times a day (BID) | SUBCUTANEOUS | Status: DC
  Administered 2014-02-21 – 2014-02-23 (×4): 25 [IU] via SUBCUTANEOUS
  Filled 2014-02-21 (×5): qty 0.25

## 2014-02-21 MED ORDER — FUROSEMIDE 40 MG PO TABS
40.0000 mg | ORAL_TABLET | ORAL | Status: DC
  Administered 2014-02-22 – 2014-02-23 (×2): 40 mg via ORAL
  Filled 2014-02-21 (×2): qty 1

## 2014-02-21 MED ORDER — INSULIN GLARGINE 100 UNIT/ML ~~LOC~~ SOLN
50.0000 [IU] | Freq: Two times a day (BID) | SUBCUTANEOUS | Status: DC
  Filled 2014-02-21: qty 0.5

## 2014-02-21 MED ORDER — ASPIRIN EC 81 MG PO TBEC
81.0000 mg | DELAYED_RELEASE_TABLET | Freq: Every day | ORAL | Status: DC
  Administered 2014-02-22 – 2014-02-23 (×2): 81 mg via ORAL
  Filled 2014-02-21 (×2): qty 1

## 2014-02-21 MED ORDER — CIPROFLOXACIN HCL 500 MG PO TABS
500.0000 mg | ORAL_TABLET | Freq: Every day | ORAL | Status: DC
  Administered 2014-02-22 – 2014-02-23 (×2): 500 mg via ORAL
  Filled 2014-02-21 (×2): qty 1

## 2014-02-21 MED ORDER — INSULIN ASPART 100 UNIT/ML ~~LOC~~ SOLN: 0.0000 [IU] | Freq: Every day | SUBCUTANEOUS | Status: DC

## 2014-02-21 MED ORDER — SIMVASTATIN 20 MG PO TABS
20.0000 mg | ORAL_TABLET | Freq: Every day | ORAL | Status: DC
  Administered 2014-02-21 – 2014-02-22 (×2): 20 mg via ORAL
  Filled 2014-02-21 (×3): qty 1

## 2014-02-21 MED ORDER — FUROSEMIDE 80 MG PO TABS: 80.0000 mg | ORAL_TABLET | Freq: Every day | ORAL | Status: DC

## 2014-02-21 MED ORDER — FUROSEMIDE 40 MG PO TABS: 40.0000 mg | ORAL_TABLET | Freq: Two times a day (BID) | ORAL | Status: DC

## 2014-02-21 MED ORDER — SODIUM CHLORIDE 0.9 % IV SOLN
INTRAVENOUS | Status: DC
  Administered 2014-02-21 – 2014-02-22 (×2): via INTRAVENOUS

## 2014-02-21 MED ORDER — INSULIN ASPART 100 UNIT/ML ~~LOC~~ SOLN
0.0000 [IU] | Freq: Three times a day (TID) | SUBCUTANEOUS | Status: DC
  Administered 2014-02-22 (×2): 4 [IU] via SUBCUTANEOUS
  Administered 2014-02-23: 3 [IU] via SUBCUTANEOUS

## 2014-02-21 MED ORDER — CARVEDILOL 3.125 MG PO TABS
3.1250 mg | ORAL_TABLET | Freq: Two times a day (BID) | ORAL | Status: DC
  Administered 2014-02-22 – 2014-02-23 (×3): 3.125 mg via ORAL
  Filled 2014-02-21 (×5): qty 1

## 2014-02-21 MED ORDER — INSULIN GLARGINE 100 UNIT/ML SOLOSTAR PEN: 50.0000 [IU] | PEN_INJECTOR | Freq: Two times a day (BID) | SUBCUTANEOUS | Status: DC

## 2014-02-21 MED ORDER — SODIUM CHLORIDE 0.9 % IV BOLUS (SEPSIS)
250.0000 mL | Freq: Once | INTRAVENOUS | Status: AC
  Administered 2014-02-21: 250 mL via INTRAVENOUS

## 2014-02-21 NOTE — H&P (Signed)
Family Medicine Teaching [facility] Admission History and Physical Service Pager: [phone]  Patient name: [name] [name] Medical record number: [id number] Date of birth: [birth year] Age: 80 y.o. Gender: female  Primary Care Provider: [name], MD Consultants: None Code Status: Full Code  Chief Complaint: Drowsiness and somnolence since awakening this am.   Assessment and Plan: LABRINA LINES is a 80 y.o. female presenting with somnolence and difficulty with arousal since her discharge from the hospital on 8/28. PMH is significant for Recent hospital admission for complicated cystitis, HTN, DMII, OSA.   # Somnolence - Difficulty arousing patient on initial interview with significant improvement upon repeat evaluation of the patient. Alert to person place and time, and responding appropriately to questioning during interview. Unsure of the etiology of the patient's symptoms, however may be related to Lyrica started at discharge. Also consider somnolence related to patients OSA and lack of use of CPAP. Vital signs stable, CT head negative, Lactate 0.79, CXR essentially unchanged, EKG normal, U/A normal, Electrolytes WNL, Cr trending down, WBC - 7, Hgb 11/34, Ammonia pending, Mild transaminitis. Doubt infectious cause in patient with no fever or elevated WBC. - Admit to Floor under Dr. McDiarmid - Follow up Ammonia level, and trending LFT's  - Also, restarted on Lyrica at discharge. Will hold for now as a side effect of this medication can be drowsiness.  - O2 requirement subjective as pt. did not desaturate upon weaning throughout our interview from 4L to 1L. Sats remained 100%.  - Given previous requirement for CPAP and Nightly O2, (pt. does not use this at home as previously prescribed) pt. may have returned home and had little sleep due to sleep apnea and lack of CPAP use overnight. This could theoretically contribute to her drowsiness at this time, especially since her drowsiness  resolved upon conversation interaction during the interview.  - WBC normal at this time. Will trend daily CBC as needed.  - U/A normal, and patient currently continuing po antibiotics for recent cystitis.  - O2 prn for Saturations >92%.  - PT for functional evaluation - will hold sedating medications  #  Complicated cystitis- at last hospital admission, pt. had an episode of complicated cystitis.  - Afebrile, Asymptomatic, VSS at this time.  - U/A within normal limits.  - Continue Ciprofloxacin  qd. per previous discharge x 3 more days.  - Continue to hold oxybutynin   # Acute on likely chronic resp failure with OSA/OHS- now on home O2; No respiratory distress at this time. CXR relatively unchanged.  will monitor for signs of resp infection; well score 0; symmetric in edema; diagnosis of PE unlikely.  - CPAP overnight as able to tolerate  - O2 to maintain sats >92   #Generalized weakness- potentially related to chronic hypoxia? Vs. Nocturnal Hypoxia?  does have hx of restless leg syndrome as well as PVD, spinal stenosis and OA  - holding requip - PT consults  - Holding lyrica at this time.  - encourage UOB as pt is able   #DM- 8.3 on 02/19/14; may be contributing to weakness - lantus 25 u BID - half of home dose  - resistant SSI  - cbgs qhs/ac  - will adjust as needed.   #HTN- on valsartan and metoprolol; normotensive to hypotensive here but responded well to bolus  - cont coreg tomorrow - continue lasix tomorrow  #CKD stage iii- Cr baseline around 1.2-1.4; 1.6 here, but trending down from her previous admission 1.82 >1.64. Continue MIVF  -  will gently rehydrate with IVF  -hold lasix tonight and restart tomorrow  -avoid nephrotoxic agents  #Depression- potentially contributing to above weakness/fatigue  -cont celexa    FEN/GI: IVF ; carb mod  Prophylaxis: heparin SQ   Disposition: Admitted for observation.   History of Present Illness: [name] [name] is a 80  y.o. female with a history of CKD, OSA, DM, HLD, HTN, and recent hospitalization for UTI, now presenting with somnolence and difficulty with arousal since this am. She was discharged from the hospital around 9pm on 8/28. She was awake and alert at discharge. She was discharged on O2 due to a new baseline O2 requirement, though she did not use her CPAP upon returning home (on history it does not sound like she has been set up with this at home yet). She says that she has not had any more medications since discharge. Family also reports that as far as they know, she has not had any medications. She has remained difficult to rouse throughout today. Whenever she is aroused she is oriented x 3, and she is able to answer questions appropriately. She says that she used her O2 at home, however she has not used a CPAP device. She denies headache, fever, chills, nausea, numbness, tingling, new weakness, dysarthria, or vision changes. She otherwise was initially difficult to arouse, however upon initiating and maintaining conversation, she was able to respond appropriately and maintained arousal and attention.  In the ED the patient had CT head that revealed no acute changes. CXR that revealed vascular congestion and left basilar changes slightly increased from previously, though appeared to be a poor quality CXR with poor inspiratory effort. Lactic acid was normal. UA was unremarkable. CMET revealed AST and ALT of 45. Blood glucose was 179 on CMET. Urine culture and blood culture collected.  Review Of Systems: Per HPI with the following additions:None Otherwise 12 point review of systems was performed and was unremarkable.  Patient Active Problem List   Diagnosis Date Noted  . Acute pyelonephritis 02/18/2014  . History of total left knee replacement 04/28/2013  . Trigger finger, acquired 04/28/2013  . Forgetfulness 08/27/2012  . Morbid obesity with BMI of 40.0-44.9, adult 04/30/2012  . Vitamin D deficiency  04/23/2012  . Kidney disease, chronic, stage III (GFR 30-59 ml/min) 07/14/2011  . 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 OSTEOPOROSIS 03/31/2010  . SPINAL STENOSIS, LUMBAR 03/10/2010  . GAIT DISTURBANCE 01/04/2010  . RESTLESS LEGS SYNDROME 07/16/2008  . DIABETES MELLITUS, TYPE II, CONTROLLED, W/NEURO COMPS 02/05/2008  . URINARY INCONTINENCE 11/14/2007  . ANEMIA, PERNICIOUS 03/11/2007  . HYPERLIPIDEMIA 08/23/2006  . DEPRESSIVE DISORDER, NOS 08/23/2006  . HYPERTENSION, BENIGN SYSTEMIC 08/23/2006  . VENOUS INSUFFICIENCY, CHRONIC 08/23/2006  . GASTROESOPHAGEAL REFLUX, NO ESOPHAGITIS 08/23/2006  . OSTEOARTHRITIS, LOWER LEG 08/23/2006  . APNEA, SLEEP 08/23/2006   Past Medical History: Past Medical History  Diagnosis Date  . History of cystoscopy 08/2005    normal  . Asthma   . Hypertension   . Diabetes mellitus without complication   . Neuropathy   . CHF (congestive heart failure)   . Sleep apnea    Past Surgical History: Past Surgical History  Procedure Laterality Date  . Cataract extraction w/ intraocular lens implant  2012    bilateral  . Total knee arthroplasty  02/2010    left   Social History: History  Substance Use Topics  . Smoking status: Passive Smoke Exposure - Never Smoker  . Smokeless tobacco: Not on file  .  Alcohol Use: No   Additional social history: None Please also refer to relevant sections of EMR.  Family History: Family History  Problem Relation Age of Onset  . Alcohol abuse Son   . Alcohol abuse Daughter     died  . Diabetes Mother   . Diabetes Daughter   . Diabetes Sister   . Coronary artery disease Sister     MI  . Alzheimer's disease Sister     died of heart proble  . Diabetes Daughter   . Diabetes Daughter   . Diabetes Son   . Diabetes Son    Allergies and Medications: Allergies  Allergen Reactions  . Clindamycin/Lincomycin Rash and Other (See Comments)    Itchy rash and burned stomach  . Tramadol Other (See  Comments)    Tremors, possible mild serotonin syndrome  . Cephalexin Diarrhea, Nausea And Vomiting and Other (See Comments)    oral ulcers  . Codeine Itching and Nausea And Vomiting  . Enalapril Maleate Hives, Swelling and Other (See Comments)    blisters on lips  . Mirtazapine Other (See Comments)    edema   Current Facility-Administered Medications on File Prior to Encounter  Medication Dose Route Frequency Provider Last Rate Last Dose  . cyanocobalamin ((VITAMIN B-12)) injection 1,000 mcg  1,000 mcg Intramuscular Once Zachery Dauer, MD       Current Outpatient Prescriptions on File Prior to Encounter  Medication Sig Dispense Refill  . allopurinol (ZYLOPRIM) 300 MG tablet Take 300 mg by mouth daily.      Marland Kitchen aspirin EC 81 MG tablet Take 81 mg by mouth daily.      . carvedilol (COREG) 3.125 MG tablet Take 1 tablet (3.125 mg total) by mouth 2 (two) times daily with a meal.  30 tablet  3  . ciprofloxacin (CIPRO) 500 MG tablet Take 1 tablet (500 mg total) by mouth daily.  5 tablet  0  . citalopram (CELEXA) 20 MG tablet Take 20 mg by mouth daily.      . fluticasone (FLONASE) 50 MCG/ACT nasal spray Place 2 sprays into both nostrils daily as needed for allergies or rhinitis.       . furosemide (LASIX) 40 MG tablet Take 40-80 mg by mouth 2 (two) times daily. 80 mg in AM and 40 mg at 2 PM      . Insulin Glargine (LANTUS SOLOSTAR) 100 UNIT/ML Solostar Pen Inject 50 Units into the skin 2 (two) times daily.      . Multiple Vitamin (MULTIVITAMIN WITH MINERALS) TABS Take 1 tablet by mouth daily.      . pregabalin (LYRICA) 150 MG capsule Take 150 mg by mouth at bedtime.      Marland Kitchen rOPINIRole (REQUIP) 4 MG tablet Take 4 mg by mouth at bedtime.      . simvastatin (ZOCOR) 20 MG tablet Take 20 mg by mouth daily.      . [DISCONTINUED] VESICARE 5 MG tablet TAKE ONE TABLET BY MOUTH ONE TIME DAILY  30 tablet  11    Objective: BP 129/44  Pulse 69  Temp(Src) 98.8 F (37.1 C) (Oral)  Resp 18  SpO2  100% Exam: General: Somnolent with difficulty in arousal, short attention, AAOx3 HEENT: NCAT, PERRLA, dry MM Cardiovascular: RRR, - MGR Respiratory: CTAB, no wheezes or crackles Abdomen: Soft, Nontender, Nondistended, No organomegaly Extremities: WWP, 2+ distal pulses, trace lower extremity edema Skin: No rashes, Bruising of RUE noted at previous IV sites Neuro: AAOx3, CN II-XII grossly intact to exam,  5/5 bilateral upper and lower extremity motor, no gross sensory deficits.  Labs and Imaging: CBC BMET   Recent Labs Lab 02/21/14 1233  WBC 7.0  HGB 11.0*  HCT 34.8*  PLT 75*    Recent Labs Lab 02/21/14 1233  NA 140  K 5.2  CL 105  CO2 24  BUN 37*  CREATININE 1.64*  GLUCOSE 179*  CALCIUM 8.4     Lactic Acid - 0.79 Glucose - 179  Urinalysis    Component Value Date/Time   COLORURINE YELLOW 02/21/2014 1255   APPEARANCEUR CLEAR 02/21/2014 1255   LABSPEC 1.017 02/21/2014 1255   PHURINE 5.5 02/21/2014 1255   GLUCOSEU NEGATIVE 02/21/2014 1255   HGBUR TRACE* 02/21/2014 1255   HGBUR negative 03/10/2010 1032   BILIRUBINUR NEGATIVE 02/21/2014 1255   BILIRUBINUR NEG 07/23/2012 1546   KETONESUR NEGATIVE 02/21/2014 1255   PROTEINUR NEGATIVE 02/21/2014 1255   PROTEINUR NEG 07/23/2012 1546   UROBILINOGEN 0.2 02/21/2014 1255   UROBILINOGEN 0.2 07/23/2012 1546   NITRITE NEGATIVE 02/21/2014 1255   NITRITE POSITIVE 07/23/2012 1546   LEUKOCYTESUR NEGATIVE 02/21/2014 1255       CT Head 8/29 -  CLINICAL DATA: ALTERED MENTAL STATUS, WEAKNESS.  EXAM:  CT HEAD WITHOUT CONTRAST  TECHNIQUE:  Contiguous axial images were obtained from the base of the skull  through the vertex without intravenous contrast.  COMPARISON: 04/26/2012  FINDINGS:  No acute intracranial abnormality. Specifically, no hemorrhage,  hydrocephalus, mass lesion, acute infarction, or significant  intracranial injury. No acute calvarial abnormality.  Mucosal thickening in the floor the maxillary sinuses. No air-fluid   levels.  Mastoid air cells are clear.  IMPRESSION:  No acute intracranial abnormality.  Electronically Signed  By: Charlett Nose M.D.  On: 02/21/2014 15:01  Chest X-Ray 8/29:  CLINICAL DATA: Altered mental status  EXAM:  PORTABLE CHEST - 1 VIEW  COMPARISON: 02/20/2014  FINDINGS:  Cardiac shadow is prominent and stable from the prior exam.  Left-sided pleural effusion is again noted. Mild vascular congestion  is now seen accentuated by poor inspiratory effort.  IMPRESSION:  Vascular congestion and left basilar changes slightly increased from  the prior exam.  Electronically Signed  By: Alcide Clever M.D.  On: 02/21/2014 14:50   Yolande Jolly, MD 02/21/2014, 4:35 PM PGY-1, [facility] FPTS Intern pager: [phone], text pages welcome  Upper Level Addendum:  I have seen and evaluated this patient along with Dr. Jaquita Rector and reviewed the above note, making necessary revisions in red.   Marikay Alar, MD Family Medicine PGY-3

## 2014-02-21 NOTE — ED Notes (Signed)
Pt returned from radiology.

## 2014-02-21 NOTE — Discharge Summary (Signed)
I have seen and examined this patient. I have discussed with Dr Michail Jewels.  I agree with their findings and plans as documented in their discharge note.

## 2014-02-21 NOTE — ED Notes (Signed)
Attempted report x1. 

## 2014-02-21 NOTE — ED Notes (Signed)
Pt placed into gown and on monitor upon return to room. Pt monitored by blood pressure, pulse ox, and 12 lead. Pt remains on 4L via [place]

## 2014-02-21 NOTE — ED Notes (Signed)
Pt remains monitored by blood pressure, pulse ox, and 12 lead. Pt remains on 4L via [place]. Pts family remains at bedside.

## 2014-02-21 NOTE — ED Notes (Signed)
I Stat Lactic Acid results shown to Dr. Darlyn Chamber

## 2014-02-21 NOTE — ED Notes (Signed)
Assisted [place], RN with in and out cath.

## 2014-02-21 NOTE — ED Provider Notes (Signed)
CSN: [id number]     Arrival date & time 02/21/14  1212 History   This chart was scribed for [name] Mulders, MD by Jarvis Morgan, ED Scribe. This patient was seen in room A01C/A01C and the patient's care was started at 1:45 PM.   Chief Complaint  Patient presents with  . Altered Mental Status   Level 5 Caveat-- Altered Mental Status  The history is provided by a relative and the EMS personnel. The history is limited by the condition of the patient. No language interpreter was used.   HPI Comments: [name] [name] is a 80 y.o. female brought in by ambulance with a h/o asthma, CHF, HTN, DM w/o complication, sleep apnea, and neuropathy who presents to the Emergency Department due to an altered mental status. Pt son reports that pt was just d/c from [facility] last night. She was admitted to increased fatigue and UTI. The EMS reports that her BS upon arrival to the scene was 165. Son reports that she has been feeling so weak and fatigued that she hasn't been able to take her medications. Pt wears 2 LPM via [place] of O2 at home. She denies any abdominal pain or chest pain.   Past Medical History  Diagnosis Date  . History of cystoscopy 08/2005    normal  . Asthma   . Hypertension   . Diabetes mellitus without complication   . Neuropathy   . CHF (congestive heart failure)   . Sleep apnea    Past Surgical History  Procedure Laterality Date  . Cataract extraction w/ intraocular lens implant  2012    bilateral  . Total knee arthroplasty  02/2010    left   Family History  Problem Relation Age of Onset  . Alcohol abuse Son   . Alcohol abuse Daughter     died  . Diabetes Mother   . Diabetes Daughter   . Diabetes Sister   . Coronary artery disease Sister     MI  . Alzheimer's disease Sister     died of heart proble  . Diabetes Daughter   . Diabetes Daughter   . Diabetes Son   . Diabetes Son    History  Substance Use Topics  . Smoking status: Passive Smoke Exposure - Never Smoker   . Smokeless tobacco: Not on file  . Alcohol Use: No   OB History   Grav Para Term Preterm Abortions TAB SAB Ect Mult Living                 Review of Systems  Unable to perform ROS: Mental status change  Cardiovascular: Negative for chest pain.  Gastrointestinal: Negative for abdominal pain.  Psychiatric/Behavioral: Positive for confusion.      Allergies  Clindamycin/lincomycin; Tramadol; Cephalexin; Codeine; Enalapril maleate; and Mirtazapine  Home Medications   Prior to Admission medications   Medication Sig Start Date End Date Taking? Authorizing Provider  allopurinol (ZYLOPRIM) 300 MG tablet Take 300 mg by mouth daily.   Yes Historical Provider, MD  aspirin EC 81 MG tablet Take 81 mg by mouth daily.   Yes Historical Provider, MD  carvedilol (COREG) 3.125 MG tablet Take 1 tablet (3.125 mg total) by mouth 2 (two) times daily with a meal. 02/20/14  Yes Charlane Ferretti, MD  ciprofloxacin (CIPRO) 500 MG tablet Take 1 tablet (500 mg total) by mouth daily. 02/20/14  Yes Charlane Ferretti, MD  citalopram (CELEXA) 20 MG tablet Take 20 mg by mouth daily.   Yes  Historical Provider, MD  fluticasone (FLONASE) 50 MCG/ACT nasal spray Place 2 sprays into both nostrils daily as needed for allergies or rhinitis.    Yes Historical Provider, MD  furosemide (LASIX) 40 MG tablet Take 40-80 mg by mouth 2 (two) times daily. 80 mg in AM and 40 mg at 2 PM   Yes Historical Provider, MD  Insulin Glargine (LANTUS SOLOSTAR) 100 UNIT/ML Solostar Pen Inject 50 Units into the skin 2 (two) times daily.   Yes Historical Provider, MD  Multiple Vitamin (MULTIVITAMIN WITH MINERALS) TABS Take 1 tablet by mouth daily.   Yes Historical Provider, MD  pregabalin (LYRICA) 150 MG capsule Take 150 mg by mouth at bedtime.   Yes Historical Provider, MD  rOPINIRole (REQUIP) 4 MG tablet Take 4 mg by mouth at bedtime.   Yes Historical Provider, MD  simvastatin (ZOCOR) 20 MG tablet Take 20 mg by mouth daily.   Yes Historical  Provider, MD   Triage Vitals: BP 108/28  Pulse 84  Temp(Src) 98.8 F (37.1 C) (Oral)  Resp 21  SpO2 95%  Physical Exam  Nursing note and vitals reviewed. Constitutional: She appears well-developed and well-nourished. No distress.  HENT:  Head: Normocephalic and atraumatic.  Mouth/Throat: Mucous membranes are dry.  Eyes: Conjunctivae and EOM are normal.  Neck: Normal range of motion. Neck supple. No tracheal deviation present.  Cardiovascular: Normal rate, regular rhythm and normal heart sounds.   No murmur heard. No swelling of bilateral ankles  Pulmonary/Chest: Effort normal and breath sounds normal. No respiratory distress. She has no wheezes. She has no rales.  Abdominal: Soft. Bowel sounds are normal. She exhibits distension. There is no tenderness.  Musculoskeletal: Normal range of motion.  Able to move all toes and fingers  Neurological: She is alert. She is disoriented. No cranial nerve deficit. She exhibits normal muscle tone. Coordination normal.  Will wake up and respond when acknowledged but not oriented. Drowsy and disoriented  Skin: Skin is warm and dry.  Psychiatric: She has a normal mood and affect. Her behavior is normal.    ED Course  Procedures (including critical care time)  DIAGNOSTIC STUDIES: Oxygen Saturation is 97% on 4 LPM via [place], normal by my interpretation.    COORDINATION OF CARE:  Results for orders placed during the hospital encounter of 02/21/14  CBC      Result Value Ref Range   WBC 7.0  4.0 - 10.5 K/uL   RBC 3.58 (*) 3.87 - 5.11 MIL/uL   Hemoglobin 11.0 (*) 12.0 - 15.0 g/dL   HCT 29.5 (*) 62.1 - 30.8 %   MCV 97.2  78.0 - 100.0 fL   MCH 30.7  26.0 - 34.0 pg   MCHC 31.6  30.0 - 36.0 g/dL   RDW 65.7  84.6 - 96.2 %   Platelets 75 (*) 150 - 400 K/uL  COMPREHENSIVE METABOLIC PANEL      Result Value Ref Range   Sodium 140  137 - 147 mEq/L   Potassium 5.2  3.7 - 5.3 mEq/L   Chloride 105  96 - 112 mEq/L   CO2 24  19 - 32 mEq/L   Glucose,  Bld 179 (*) 70 - 99 mg/dL   BUN 37 (*) 6 - 23 mg/dL   Creatinine, Ser 9.52 (*) 0.50 - 1.10 mg/dL   Calcium 8.4  8.4 - 84.1 mg/dL   Total Protein 6.1  6.0 - 8.3 g/dL   Albumin 2.7 (*) 3.5 - 5.2 g/dL   AST 45 (*)  0 - 37 U/L   ALT 45 (*) 0 - 35 U/L   Alkaline Phosphatase 87  39 - 117 U/L   Total Bilirubin <0.2 (*) 0.3 - 1.2 mg/dL   GFR calc non Af Amer 29 (*) >90 mL/min   GFR calc Af Amer 33 (*) >90 mL/min   Anion gap 11  5 - 15  URINALYSIS, ROUTINE W REFLEX MICROSCOPIC      Result Value Ref Range   Color, Urine YELLOW  YELLOW   APPearance CLEAR  CLEAR   Specific Gravity, Urine 1.017  1.005 - 1.030   pH 5.5  5.0 - 8.0   Glucose, UA NEGATIVE  NEGATIVE mg/dL   Hgb urine dipstick TRACE (*) NEGATIVE   Bilirubin Urine NEGATIVE  NEGATIVE   Ketones, ur NEGATIVE  NEGATIVE mg/dL   Protein, ur NEGATIVE  NEGATIVE mg/dL   Urobilinogen, UA 0.2  0.0 - 1.0 mg/dL   Nitrite NEGATIVE  NEGATIVE   Leukocytes, UA NEGATIVE  NEGATIVE  URINE MICROSCOPIC-ADD ON      Result Value Ref Range   Squamous Epithelial / LPF RARE  RARE   WBC, UA 0-2  <3 WBC/hpf   RBC / HPF 0-2  <3 RBC/hpf  I-STAT CG4 LACTIC ACID, ED      Result Value Ref Range   Lactic Acid, Venous 0.79  0.5 - 2.2 mmol/L   Dg Chest 2 View  02/20/2014   CLINICAL DATA:  Weakness shortness of breath  EXAM: CHEST  2 VIEW  COMPARISON:  02/19/2014  FINDINGS: Limited inspiratory effect with normal heart size. Bilateral lower lobe atelectasis similar to prior study. There is a small left pleural effusion.  IMPRESSION: Small left effusion with bibasilar atelectasis   Electronically Signed   By: Esperanza Heir M.D.   On: 02/20/2014 07:49   Dg Chest Port 1 View  02/19/2014   CLINICAL DATA:  Bibasilar crackles.  Fever.  EXAM: PORTABLE CHEST - 1 VIEW  COMPARISON:  February 18, 2014.  FINDINGS: Stable cardiomegaly. No pneumothorax is noted. Stable pleural effusion or scarring is noted laterally in the left lung base. Right lung is clear. Narrowing of right sub  acromial space is noted consistent with rotator cuff injury.  IMPRESSION: Stable left basilar opacity is noted consistent with pleural effusion or scarring.   Electronically Signed   By: Roque Lias M.D.   On: 02/19/2014 09:13   Dg Chest Port 1 View  02/18/2014   CLINICAL DATA:  Fatigue.  Shortness of breath and fever.  EXAM: PORTABLE CHEST - 1 VIEW  COMPARISON:  PA and lateral chest 07/03/2013 and 04/26/2012.  FINDINGS: Mild elevation of the right hemidiaphragm is unchanged. Scarring in the left lung base is also stable in appearance. Lungs otherwise clear. Heart size normal. No pneumothorax or pleural effusion.  IMPRESSION: No acute disease.  Stable compared to prior exams.   Electronically Signed   By: Drusilla Kanner M.D.   On: 02/18/2014 20:45    Medications  0.9 %  sodium chloride infusion ( Intravenous New Bag/Given 02/21/14 1424)  sodium chloride 0.9 % bolus 250 mL (0 mLs Intravenous Stopped 02/21/14 1458)    Date: 02/21/2014  Rate: 85  Rhythm: normal sinus rhythm  QRS Axis: normal  Intervals: normal  ST/T Wave abnormalities: nonspecific ST/T changes  Conduction Disutrbances:none  Narrative Interpretation:   Old EKG Reviewed: changes noted Show some signs of early repolarization. No sniffing change from previous EKG.    MDM   Final diagnoses:  Altered mental status, unspecified altered mental status type    Patient will be admitted by family practice. She was discharged from their service just yesterday. Was in for UTI. Urinalysis here today is normal. Patient's family claims she didn't take any medicines at home. Patient's lab workup here including chest x-ray head CT without explanation for altered mental status. Patient is maintaining her airway. Patient will wake up and follow commands when prompted. However patient is drowsy able to back off to sleep. No evidence of pneumonia no change on head CT. No significant changes in the electrolytes. Lactic acid is not elevated. No  evidence of early sepsis or septic picture. Patient is not hypotensive. Systolic pressure right now route 129. No fever.    I personally performed the services described in this documentation, which was scribed in my presence. The recorded information has been reviewed and is accurate.      [name] Mulders, MD 02/21/14 [phone]

## 2014-02-21 NOTE — Progress Notes (Signed)
I have seen and examined this patient. I have discussed with Dr Street.  I agree with their findings and plans as documented in their progress note.    

## 2014-02-21 NOTE — ED Notes (Signed)
Pts CBG 165 reported to nurse. EKG given to Dr. Deretha Emory

## 2014-02-21 NOTE — ED Notes (Signed)
Pt placed on monitor upon return to room from radiology. Pt monitored by blood pressure, pulse ox, and 12 lead. Pt remains on 4L via [place].

## 2014-02-21 NOTE — ED Notes (Signed)
phlebotomy at bedside.  

## 2014-02-21 NOTE — ED Notes (Signed)
Per EMS - pt coming from home, c/o increased weakness. sts she was recently dx with a UTI a few days ago but is so tired and weak that she can't take her meds. O2 sats 95% on 4 liters/min [place], normally wears 2 liters/min at home. Following commands appropriately. CBG 97. BP 123/63. Pt warm to touch. 20 G in left AC. Hx of DM, CHF, HTN.

## 2014-02-21 NOTE — ED Notes (Signed)
Sent urine culture down to main lab as add on

## 2014-02-22 ENCOUNTER — Encounter (HOSPITAL_COMMUNITY): Payer: Self-pay | Admitting: Family Medicine

## 2014-02-22 ENCOUNTER — Encounter: Payer: Self-pay | Admitting: Family Medicine

## 2014-02-22 DIAGNOSIS — G9349 Other encephalopathy: Secondary | ICD-10-CM | POA: Diagnosis not present

## 2014-02-22 DIAGNOSIS — M4802 Spinal stenosis, cervical region: Secondary | ICD-10-CM

## 2014-02-22 DIAGNOSIS — R404 Transient alteration of awareness: Secondary | ICD-10-CM

## 2014-02-22 DIAGNOSIS — R4182 Altered mental status, unspecified: Secondary | ICD-10-CM

## 2014-02-22 HISTORY — DX: Spinal stenosis, cervical region: M48.02

## 2014-02-22 LAB — RAPID URINE DRUG SCREEN, HOSP PERFORMED
Amphetamines: NOT DETECTED
BARBITURATES: NOT DETECTED
BENZODIAZEPINES: NOT DETECTED
Cocaine: NOT DETECTED
Opiates: NOT DETECTED
Tetrahydrocannabinol: NOT DETECTED

## 2014-02-22 LAB — CBC
HEMATOCRIT: 35.1 % — AB (ref 36.0–46.0)
Hemoglobin: 11 g/dL — ABNORMAL LOW (ref 12.0–15.0)
MCH: 30.6 pg (ref 26.0–34.0)
MCHC: 31.3 g/dL (ref 30.0–36.0)
MCV: 97.8 fL (ref 78.0–100.0)
Platelets: 129 10*3/uL — ABNORMAL LOW (ref 150–400)
RBC: 3.59 MIL/uL — AB (ref 3.87–5.11)
RDW: 15.3 % (ref 11.5–15.5)
WBC: 7.1 10*3/uL (ref 4.0–10.5)

## 2014-02-22 LAB — URINE CULTURE
COLONY COUNT: NO GROWTH
CULTURE: NO GROWTH

## 2014-02-22 LAB — BASIC METABOLIC PANEL
ANION GAP: 8 (ref 5–15)
BUN: 32 mg/dL — AB (ref 6–23)
CHLORIDE: 104 meq/L (ref 96–112)
CO2: 27 mEq/L (ref 19–32)
Calcium: 8.8 mg/dL (ref 8.4–10.5)
Creatinine, Ser: 1.39 mg/dL — ABNORMAL HIGH (ref 0.50–1.10)
GFR calc non Af Amer: 35 mL/min — ABNORMAL LOW (ref >90)
GFR, EST AFRICAN AMERICAN: 41 mL/min — AB (ref >90)
Glucose, Bld: 159 mg/dL — ABNORMAL HIGH (ref 70–99)
POTASSIUM: 4.6 meq/L (ref 3.7–5.3)
Sodium: 139 mEq/L (ref 137–147)

## 2014-02-22 LAB — GLUCOSE, CAPILLARY
GLUCOSE-CAPILLARY: 186 mg/dL — AB (ref 70–99)
GLUCOSE-CAPILLARY: 173 mg/dL — AB (ref 70–99)
GLUCOSE-CAPILLARY: 158 mg/dL — AB (ref 70–99)
Glucose-Capillary: 119 mg/dL — ABNORMAL HIGH (ref 70–99)

## 2014-02-22 LAB — AMMONIA: AMMONIA: 29 umol/L (ref 11–60)

## 2014-02-22 MED ORDER — ROPINIROLE HCL 1 MG PO TABS
2.0000 mg | ORAL_TABLET | ORAL | Status: AC
  Administered 2014-02-22: 2 mg via ORAL
  Filled 2014-02-22: qty 2

## 2014-02-22 MED ORDER — ROPINIROLE HCL 1 MG PO TABS
2.0000 mg | ORAL_TABLET | Freq: Every day | ORAL | Status: DC
  Administered 2014-02-22: 2 mg via ORAL
  Filled 2014-02-22 (×2): qty 2

## 2014-02-22 MED ORDER — CALCIUM CITRATE 950 (200 CA) MG PO TABS
200.0000 mg | ORAL_TABLET | Freq: Two times a day (BID) | ORAL | Status: DC
  Administered 2014-02-22 – 2014-02-23 (×3): 200 mg via ORAL
  Filled 2014-02-22 (×4): qty 1

## 2014-02-22 MED ORDER — VITAMIN D3 25 MCG (1000 UNIT) PO TABS
1000.0000 [IU] | ORAL_TABLET | Freq: Every day | ORAL | Status: DC
  Administered 2014-02-22 – 2014-02-23 (×2): 1000 [IU] via ORAL
  Filled 2014-02-22 (×3): qty 1

## 2014-02-22 MED ORDER — SALINE SPRAY 0.65 % NA SOLN
1.0000 | NASAL | Status: DC | PRN
  Filled 2014-02-22: qty 44

## 2014-02-22 MED ORDER — ROPINIROLE HCL 1 MG PO TABS
4.0000 mg | ORAL_TABLET | Freq: Every day | ORAL | Status: DC
  Filled 2014-02-22: qty 4

## 2014-02-22 NOTE — Progress Notes (Signed)
I have seen and examined this patient. I have discussed with Dr Doroteo Glassman.  I agree with their findings and plans as documented in their progress note.  Recommend reconsultation with PT. Consider SNF placement for skilled nursing care for resolving delirium.  Agree with stopping oxybutinin  Agree with holding pregabalin for now May restart Requip at lower dose of 2 mg daily at bedtime for RLL.   Patient may benefit from a formal outpatient consultation with a Sleep Specialist ([facility] or [facility]) to see if pt would be able to tolerate an newer type of CPAP mask.  If pt unable to tolerate mask and a formal sleep study continues to show moderate to severe sleep apnea, pt may need to consider tracheostomy if an oral appliance is not adequate or tolerable apnea Intervention.

## 2014-02-22 NOTE — H&P (Signed)
I have seen and examined this patient. I have discussed with Dr Birdie Sons.  I agree with their findings and plans as documented in their admission note.  Acute Issues 1. Acute Delirium - acute change in baseline cognition and level of consciousness at home - Onset the morning after patient transferred home from hospital. Pt had been hospitalized for a hypotensive Febrile (102.7 F) illness 02/18/14 thought secondary to E. Coli pyelonephritis / hypovolemia and hypoxemia after presenting  with lethargy and psychomotor slowing. Treated with antibiotics, holding of Lasix.  Started on home O2. Switched to Coreg 3.125 from metoprolol for bradycardia.   - Predisposing conditions to delirium: Functional dependence, immobility, psychoactive medications, multiple comorbid chronic medical conditions. - MMSE 18 out of 30.  Pt had difficulty retaining question long enough to answer it.  Psychomotor slowing.  Was attentive to examiner the whole 20 minutes for the administration of the exam.   - She denied depressed mood or loss of pleasure in life.  - She has OSA and OHS.  She does not adhere to use of CPAP because of discomfort.    - It will be difficult to attribute the patient's delirium to a single cause.  Ms Parr has multiple predisposing factors that lower her threshold for delirium.  Her recent febrile illness along chronic hypoxia from OSA and OHS in setting of possible Cognitive Impairment make her susceptible to a waxing and waning consciousness.   -  Ms Elders may need to go to a Skilled Nursing Facility for skilled nursing care while the delirium slowly resolves and she returns to her baseline level of cognition and function.

## 2014-02-22 NOTE — Progress Notes (Signed)
**  Interim Note**  Multiple calls received overnight regarding patient's Requip for restless leg syndrome.  Advised RN to continue to hold medication ovenight, as patient is being worked up for somnolence and all sedating medications are being held.  Patient was seen by me this morning.  Mentation was assessed.  Patient is alert and oriented x3.  She was able to converse appropriately and did not appear somnolent.  She was however, holding her left calf in discomfort.  Plan is to restart Requip for relief of restless leg symptoms.  Dr Phelps/ Dr McDiarmid will reassess later this morning and add Lyrica back as appropriate.  Roberta Kelly M. Nadine Counts, DO 02/22/14 5:57am PGY-1, [facility]

## 2014-02-22 NOTE — Progress Notes (Signed)
Patient ID: [name] [name], female   DOB: [birth year], 80 y.o.   MRN: [id number] Family Medicine Teaching Service Daily Progress Note Intern Pager: [phone]  Patient name: [name] [name] Medical record number: [id number] Date of birth: May 31, 1934 Age: 80 y.o. Gender: female  Primary Care Provider: [name], MD Consultants: None Code Status: Full  Pt Overview and Major Events to Date:  8/29- Admitted for Somnolence  Assessment and Plan: [name] [name] is a 80 y.o. female presenting with somnolence and difficulty with arousal since her discharge from the hospital on 8/28. PMH is significant for Recent hospital admission for complicated cystitis, HTN, DMII, OSA.   # Somnolence - Difficulty arousing patient on initial interview. Unsure of the etiology of the patient's symptoms, however may be related to Lyrica started at discharge. Also consider somnolence related to patients OSA and lack of use of CPAP. Vital signs stable, CT head negative, Lactate 0.79, CXR essentially unchanged, EKG normal, U/A normal, Electrolytes WNL, Cr trending down, WBC - 7, Hgb 11, ammonia wnl. Doubt infectious cause in patient with no fever or elevated WBC. Patient awake, alert, and oriented this morning. - Lyrica held; will consider discontinuation at discharge due to SE of medication being drowsiness - Given previous requirement for CPAP and Nightly O2- patient started on O2 and CPAP   - WBC normal at this time. Will trend daily CBC as needed.  - U/A normal, and patient currently continuing po antibiotics for recent cystitis.  - O2 prn for Saturations >92%.  - PT for functional evaluation  - will hold sedating medications  -MMSE 14 Will need Geri clinic follow-up in 4-6 wks  # Complicated cystitis- at last hospital admission, pt. had an episode of complicated cystitis.  - Afebrile, Asymptomatic, VSS at this time.  - U/A within normal limits.  - Continue Ciprofloxacin  qd. per previous discharge x 2 more  days.  - Continue to hold oxybutynin   # Acute on likely chronic resp failure with OSA/OHS- now on home O2; No respiratory distress at this time. CXR relatively unchanged. will monitor for signs of resp infection; well score 0; diagnosis of PE unlikely.  - CPAP overnight as able to tolerate  - O2 to maintain sats >92; currently on 2L [place] -case management contacted for home CPAP needs  #Generalized weakness- potentially related to chronic hypoxia? Vs. Nocturnal Hypoxia? does have hx of restless leg syndrome as well as PVD, spinal stenosis and OA  -restart Requib at bedtime - PT consult pending  - Holding lyrica at this time.  - encourage UOB as pt is able   #DM2- A1C 8.3 on 02/19/14; may be contributing to weakness  - lantus 25 u BID - half of home dose  - resistant SSI  - cbgs qhs/ac  - will adjust as needed.   #HTN- on valsartan and metoprolol; normotensive to hypotensive here but responded well to bolus  - cont coreg and lasix restarted today  #CKD stage iii- Cr baseline around 1.2-1.4; Today 1.39; improved and now at baseline -will gently rehydrate with IVF  -avoid nephrotoxic agents   #Depression- potentially contributing to above weakness/fatigue  -cont celexa   #Nose Bleeding - started last night; bilateral; not heavy and stops on its own; potentially due to fluticasone use by patient at home on top of oxygen possible drying out the nose. -prn nasal saline for dryness -humidify oxygen -consider afrin for vasoconstriction  FEN/GI: IVF ; carb mod  Prophylaxis: heparin SQ  Disposition: Continue  current management as above; discharge pending PT eval and case management.  Subjective:  Patient complaining of some nose bleeds since last night. The bleeds are not heavy but keep recurring. Also daughter in room states [name] [name] was up all night due to her restless leg syndrome and the holding of her Requib.  Objective: Temp:  [98 F (36.7 C)-99.5 F (37.5 C)] 99.5 F  (37.5 C) (08/30 0510) Pulse Rate:  [69-97] 84 (08/30 0510) Resp:  [15-22] 19 (08/30 0510) BP: (94-146)/(28-60) 146/60 mmHg (08/30 0510) SpO2:  [94 %-100 %] 94 % (08/30 0510) FiO2 (%):  [4.5 %] 4.5 % (08/29 1900) Physical Exam: General: NAD, awake sitting on side of bed eating breakfast, AAOx3  HEENT: NCAT, EOMI, nares with dried blood bilaterally. Cardiovascular: RRR, - MGR  Respiratory: CTAB, no wheezes or crackles, decreased breath sounds and air movement, nml WOB  Abdomen: Soft, Nontender, Nondistended, No organomegaly, +BS Extremities: WWP, 2+ distal pulses, trace lower extremity edema, skin toughening on bilateral LE Skin: No rashes, Bruising bilateral arms noted at previous IV sites  Neuro: AAOx3, CN II-XII grossly intact to exam, 5/5 bilateral upper and lower extremity motor, no gross sensory deficits.  Laboratory:  Recent Labs Lab 02/19/14 0821 02/21/14 1233 02/22/14 0429  WBC 5.8 7.0 7.1  HGB 11.0* 11.0* 11.0*  HCT 35.7* 34.8* 35.1*  PLT 121* 75* 129*    Recent Labs Lab 02/18/14 2009  02/20/14 1425 02/21/14 1233 02/22/14 0429  NA 137  < > 138 140 139  K 4.7  < > 5.2 5.2 4.6  CL 94*  < > 102 105 104  CO2 27  < > BUN 26*  < > 35* 37* 32*  CREATININE 1.76*  < > 1.82* 1.64* 1.39*  CALCIUM 9.0  < > 8.2* 8.4 8.8  PROT 7.3  --   --  6.1  --   BILITOT 0.5  --   --  <0.2*  --   ALKPHOS 117  --   --  87  --   ALT 19  --   --  45*  --   AST 32  --   --  45*  --   GLUCOSE 297*  < > 256* 179* 159*  < > = values in this interval not displayed.  Urinalysis    Component Value Date/Time   COLORURINE YELLOW 02/21/2014 1255   APPEARANCEUR CLEAR 02/21/2014 1255   LABSPEC 1.017 02/21/2014 1255   PHURINE 5.5 02/21/2014 1255   GLUCOSEU NEGATIVE 02/21/2014 1255   HGBUR TRACE* 02/21/2014 1255   HGBUR negative 03/10/2010 1032   BILIRUBINUR NEGATIVE 02/21/2014 1255   BILIRUBINUR NEG 07/23/2012 1546   KETONESUR NEGATIVE 02/21/2014 1255   PROTEINUR NEGATIVE 02/21/2014 1255    PROTEINUR NEG 07/23/2012 1546   UROBILINOGEN 0.2 02/21/2014 1255   UROBILINOGEN 0.2 07/23/2012 1546   NITRITE NEGATIVE 02/21/2014 1255   NITRITE POSITIVE 07/23/2012 1546   LEUKOCYTESUR NEGATIVE 02/21/2014 1255    Imaging/Diagnostic Tests: Ct Head Wo Contrast  02/21/2014   CLINICAL DATA:  ALTERED MENTAL STATUS, WEAKNESS.  EXAM: CT HEAD WITHOUT CONTRAST  TECHNIQUE: Contiguous axial images were obtained from the base of the skull through the vertex without intravenous contrast.  COMPARISON:  04/26/2012  FINDINGS: No acute intracranial abnormality. Specifically, no hemorrhage, hydrocephalus, mass lesion, acute infarction, or significant intracranial injury. No acute calvarial abnormality.  Mucosal thickening in the floor the maxillary sinuses. No air-fluid levels.  Mastoid air cells are clear.  IMPRESSION: No  acute intracranial abnormality.   Electronically Signed   By: Charlett Nose M.D.   On: 02/21/2014 15:01   Dg Chest Port 1 View  02/21/2014   CLINICAL DATA:  Altered mental status  EXAM: PORTABLE CHEST - 1 VIEW  COMPARISON:  02/20/2014  FINDINGS: Cardiac shadow is prominent and stable from the prior exam. Left-sided pleural effusion is again noted. Mild vascular congestion is now seen accentuated by poor inspiratory effort.  IMPRESSION: Vascular congestion and left basilar changes slightly increased from the prior exam.   Electronically Signed   By: Alcide Clever M.D.   On: 02/21/2014 14:50    Pincus Large, DO 02/22/2014, 7:15 AM PGY-1, [facility] FPTS Intern pager: [phone], text pages welcome

## 2014-02-22 NOTE — Evaluation (Signed)
Physical Therapy Evaluation Patient Details Name: [name] [name] MRN: [id number] DOB: [birth year] Today's Date: 02/22/2014   History of Present Illness  Pt admitted 02-21-14 with AMS.  Previous recent admit 8-26 to 8-28 with fever and hypotension.  Clinical Impression  Pt appears more alert today as compared to admission yesterday.  Skilled PT indicated to increase functional mobility to improve independence and safety prior to d/c home.    Follow Up Recommendations Home health PT;Supervision/Assistance - 24 hour    Equipment Recommendations       Recommendations for Other Services       Precautions / Restrictions Precautions Precautions: Fall      Mobility  Bed Mobility Overal bed mobility: Needs Assistance       Supine to sit: Min assist        Transfers Overall transfer level: Needs assistance Equipment used: Rolling walker (2 wheeled)   Sit to Stand: Min assist            Ambulation/Gait Ambulation/Gait assistance: Min guard Ambulation Distance (Feet): 50 Feet Assistive device: Rolling walker (2 wheeled) Gait Pattern/deviations: Step-through pattern;Decreased stride length Gait velocity: decreased      Stairs            Wheelchair Mobility    Modified Rankin (Stroke Patients Only)       Balance                                             Pertinent Vitals/Pain Pain Assessment: No/denies pain    Home Living Family/patient expects to be discharged to:: Private residence Living Arrangements: Alone Available Help at Discharge: Family;Available 24 hours/day Type of Home: Mobile home Home Access: Ramped entrance     Home Layout: One level Home Equipment: Walker - 4 wheels;Bedside commode      Prior Function Level of Independence: Needs assistance   Gait / Transfers Assistance Needed: ambulates with RW           Hand Dominance   Dominant Hand: Right    Extremity/Trunk Assessment                       Cervical / Trunk Assessment: Kyphotic  Communication   Communication: HOH  Cognition Arousal/Alertness: Awake/alert Behavior During Therapy: WFL for tasks assessed/performed Overall Cognitive Status: Within Functional Limits for tasks assessed                      General Comments      Exercises        Assessment/Plan    PT Assessment Patient needs continued PT services  PT Diagnosis Difficulty walking;Generalized weakness   PT Problem List Decreased strength;Decreased activity tolerance;Decreased balance;Cardiopulmonary status limiting activity;Obesity  PT Treatment Interventions DME instruction;Gait training;Functional mobility training;Therapeutic activities;Therapeutic exercise;Patient/family education;Balance training   PT Goals (Current goals can be found in the Care Plan section) Acute Rehab PT Goals Patient Stated Goal: home PT Goal Formulation: With patient Time For Goal Achievement: 03/01/14 Potential to Achieve Goals: Good    Frequency Min 3X/week   Barriers to discharge        Co-evaluation               End of Session Equipment Utilized During Treatment: Gait belt;Oxygen Activity Tolerance: Patient tolerated treatment well Patient left: in chair;with call bell/phone within reach Nurse Communication: Mobility status  Time: 9604-5409 PT Time Calculation (min): 11 min   Charges:     PT Treatments $Gait Training: 8-22 mins   PT G Codes:          Ilda Foil 02/22/2014, 1:11 PM  Aida Raider, PT  Office # [phone] Pager [phone]

## 2014-02-23 DIAGNOSIS — N183 Chronic kidney disease, stage 3 unspecified: Secondary | ICD-10-CM

## 2014-02-23 DIAGNOSIS — I10 Essential (primary) hypertension: Secondary | ICD-10-CM

## 2014-02-23 DIAGNOSIS — Z6841 Body Mass Index (BMI) 40.0 and over, adult: Secondary | ICD-10-CM

## 2014-02-23 DIAGNOSIS — E1149 Type 2 diabetes mellitus with other diabetic neurological complication: Secondary | ICD-10-CM

## 2014-02-23 DIAGNOSIS — G9349 Other encephalopathy: Secondary | ICD-10-CM | POA: Diagnosis not present

## 2014-02-23 DIAGNOSIS — E785 Hyperlipidemia, unspecified: Secondary | ICD-10-CM

## 2014-02-23 LAB — CBC
HEMATOCRIT: 34.3 % — AB (ref 36.0–46.0)
Hemoglobin: 11.2 g/dL — ABNORMAL LOW (ref 12.0–15.0)
MCH: 30.1 pg (ref 26.0–34.0)
MCHC: 32.7 g/dL (ref 30.0–36.0)
MCV: 92.2 fL (ref 78.0–100.0)
Platelets: 147 10*3/uL — ABNORMAL LOW (ref 150–400)
RBC: 3.72 MIL/uL — ABNORMAL LOW (ref 3.87–5.11)
RDW: 14.9 % (ref 11.5–15.5)
WBC: 7 10*3/uL (ref 4.0–10.5)

## 2014-02-23 LAB — GLUCOSE, CAPILLARY
GLUCOSE-CAPILLARY: 165 mg/dL — AB (ref 70–99)
Glucose-Capillary: 102 mg/dL — ABNORMAL HIGH (ref 70–99)
Glucose-Capillary: 142 mg/dL — ABNORMAL HIGH (ref 70–99)

## 2014-02-23 MED ORDER — VITAMIN D3 25 MCG (1000 UNIT) PO TABS: 1000.0000 [IU] | ORAL_TABLET | Freq: Every day | ORAL | Status: DC

## 2014-02-23 MED ORDER — CALCIUM CITRATE 950 (200 CA) MG PO TABS: 200.0000 mg | ORAL_TABLET | Freq: Two times a day (BID) | ORAL | Status: DC

## 2014-02-23 NOTE — Progress Notes (Signed)
Discharge information reviewed with pt and two daughters at the bedside. All Rx's reviewed and when to take medications. Appropriate questions were asked. Pt's daughter expressed concern about pt having the money to eat good foods. Social worker who was close by took down pt's information and will try to get resource information to pt and family. Pt ready for discharge.

## 2014-02-23 NOTE — Care Management Note (Signed)
  Page 2 of 2   02/23/2014     10:03:38 AM CARE MANAGEMENT NOTE 02/23/2014  Patient:  MERIE, WULF   Account Number:  [id number]  Date Initiated:  02/23/2014  Documentation initiated by:  Ronny Flurry  Subjective/Objective Assessment:     Action/Plan:   Anticipated DC Date:  02/23/2014   Anticipated DC Plan:  HOME W HOME HEALTH SERVICES         Choice offered to / List presented to:  C-4 Adult Children        HH arranged  HH-1 RN  HH-2 PT  HH-3 OT  HH-4 NURSE'S AIDE      HH agency  [facility]   Status of service:   Medicare Important Message given?  YES (If response is "NO", the following Medicare IM given date fields will be blank) Date Medicare IM given:  02/23/2014 Medicare IM given by:  Ronny Flurry Date Additional Medicare IM given:   Additional Medicare IM given by:    Discharge Disposition:    Per UR Regulation:    If discussed at Long Length of Stay Meetings, dates discussed:    Comments:  02-23-14  Left message with patient's daughter Ruby Logiudice [phone]. Spoke with daughter Antionette Poles [phone] , plan is to take her mother home with home health . Patient's daughter Lyne Khurana has moved in with patient . [name] wanted NCM to stress to Shley Dolby that their mother cannot be left alone .   Gilford Rile returned call. Discussed home health with Maralyn Sago , she does want to stay with [facility] . Patient has home oxygen through [facility] , but they will need a portable tank to transport patinet homw with ( ordered from [facility] ) .  Patient has not had a CPAP for 18 years . Ordered one through [facility] . Patient needs to have a sleep study scheduled Sarah aware.   First available sleep study is Monday April 13, 2014 at 8 pm . MD needs to complete and sign sleep study order form. On discharge instructions .  Stressed to Kaziyah Parkison her mother needs 24 hour supervision , she cannot be left alone .   Gilford Rile assured NCM  that her mother would have someone with her at ALL times.   Ronny Flurry RN BSN [phone]

## 2014-02-23 NOTE — Discharge Summary (Signed)
Family Medicine Teaching [facility] Discharge Summary  Patient name: [name] [name] Medical record number: [id number] Date of birth: Oct 14, 1933 Age: 80 y.o. Gender: female Date of Admission: 02/21/2014  Date of Discharge: 02/23/2014 Admitting Physician: Leighton Roach McDiarmid, MD  Primary Care Provider: [name], MD Consultants: None  Indication for Hospitalization: Somnolence  Discharge Diagnoses/Problem List:  Patient Active Problem List   Diagnosis Date Noted  . Spinal stenosis in cervical region 02/22/2014  . Acute delirium 02/21/2014  . Acute pyelonephritis 02/18/2014  . History of total left knee replacement 04/28/2013  . Trigger finger, acquired 04/28/2013  . Forgetfulness 08/27/2012  . Morbid obesity with BMI of 40.0-44.9, adult 04/30/2012  . Vitamin D deficiency 04/23/2012  . Kidney disease, chronic, stage III (GFR 30-59 ml/min) 07/14/2011  . 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 OSTEOPOROSIS 03/31/2010  . SPINAL STENOSIS, LUMBAR 03/10/2010  . GAIT DISTURBANCE 01/04/2010  . RESTLESS LEGS SYNDROME 07/16/2008  . DIABETES MELLITUS, TYPE II, CONTROLLED, W/NEURO COMPS 02/05/2008  . URINARY INCONTINENCE 11/14/2007  . ANEMIA, PERNICIOUS 03/11/2007  . HYPERLIPIDEMIA 08/23/2006  . DEPRESSIVE DISORDER, NOS 08/23/2006  . HYPERTENSION, BENIGN SYSTEMIC 08/23/2006  . VENOUS INSUFFICIENCY, CHRONIC 08/23/2006  . GASTROESOPHAGEAL REFLUX, NO ESOPHAGITIS 08/23/2006  . OSTEOARTHRITIS, LOWER LEG 08/23/2006  . APNEA, SLEEP 08/23/2006   Disposition: Discharge Home with [facility] services  Discharge Condition: Stable  Discharge Exam:  General: NAD, awake and alert  HEENT: NCAT, EOMI, nares clear  Cardiovascular: RRR, - MGR  Respiratory: CTAB, no wheezes or crackles, decreased breath sounds and air movement, nml WOB  Abdomen: Soft, Nontender, Nondistended, No organomegaly, +BS  Extremities: WWP, 2+ distal pulses, trace lower extremity edema  Skin: Bruising bilateral  arms noted at previous IV sites  Neuro: Memory deficits noted.   Brief Hospital Course:  [name] [name] is a 80 y.o. female who presented with somnolence and difficulty with arousal. PMH is significant for recent hospital admission for complicated cystitis, HTN, DMII, OSA. Unsure of the etiology of the patient's symptoms, however may be related to Lyrica started at discharge. Lyrica was held. Also considered somnolence related to patients OSA and lack of use of CPAP. Patient was given CPAP in hospital but refused to use it. She will get a formal outpatient consultation with a Sleep Specialist to see if pt would be able to tolerate a newer CPAP mask. Patient did not have CPAP at home so it was ordered for her. Vital signs were stable, CT head negative, Lactate 0.79, CXR essentially unchanged, EKG normal, U/A normal, Electrolytes WNL, WBC - 7, Hgb 11, ammonia wnl. Doubt infectious cause in patient with no fever or elevated WBC. A MMSE done while inpatient was 14. She will need follow-up in the Geriatric clinic in 4-6 wks. Patient improved at discharge.   For management of patients other problems please see below: # Complicated cystitis- at last hospital admission. Continued Ciprofloxacin  qd. per previous discharge. Held oxybutynin. #Generalized weakness- potentially related to chronic hypoxia; does have hx of restless leg syndrome -Continued Requib at bedtime  - PT consult suggested continued services so patient set up with [facility] services #DM2- A1C 8.3 on 02/19/14; lantus 25U BID - half of home dose with SSI #HTN- on valsartan and metoprolol; continued coreg and lasix restarted today  #CKD stage iii- Cr baseline around 1.2-1.4; #Depression- potentially contributing to above weakness/fatigue; continued celexa    Issues for Follow Up:  1. Outpatient Geriatrics clinic follow-up to evaluate mental status. Patient exhibited signs of  dementia/delerium. MMSE 14.  2. Sleep study follow-up with associated  order for CPAP at home 3. Avoid medications that cause alterations in mental status; can consider starting patient on another medication in place of Lyrica if needed. 4.Contnued HH services for PT/OT  Significant Procedures: None  Significant Labs and Imaging:   Recent Labs Lab 02/21/14 1233 02/22/14 0429 02/23/14 0721  WBC 7.0 7.1 7.0  HGB 11.0* 11.0* 11.2*  HCT 34.8* 35.1* 34.3*  PLT 75* 129* 147*    Recent Labs Lab 02/18/14 2009 02/19/14 0821 02/20/14 1425 02/21/14 1233 02/22/14 0429  NA 137 141 138 140 139  K 4.7 4.5 5.2 5.2 4.6  CL 94* 103 102 105 104  CO2 GLUCOSE 297* 158* 256* 179* 159*  BUN 26* 30* 35* 37* 32*  CREATININE 1.76* 2.13* 1.82* 1.64* 1.39*  CALCIUM 9.0 7.3* 8.2* 8.4 8.8  ALKPHOS 117  --   --  87  --   AST 32  --   --  45*  --   ALT 19  --   --  45*  --   ALBUMIN 3.3*  --   --  2.7*  --    Urinalysis    Component Value Date/Time   COLORURINE YELLOW 02/21/2014 1255   APPEARANCEUR CLEAR 02/21/2014 1255   LABSPEC 1.017 02/21/2014 1255   PHURINE 5.5 02/21/2014 1255   GLUCOSEU NEGATIVE 02/21/2014 1255   HGBUR TRACE* 02/21/2014 1255   HGBUR negative 03/10/2010 1032   BILIRUBINUR NEGATIVE 02/21/2014 1255   BILIRUBINUR NEG 07/23/2012 1546   KETONESUR NEGATIVE 02/21/2014 1255   PROTEINUR NEGATIVE 02/21/2014 1255   PROTEINUR NEG 07/23/2012 1546   UROBILINOGEN 0.2 02/21/2014 1255   UROBILINOGEN 0.2 07/23/2012 1546   NITRITE NEGATIVE 02/21/2014 1255   NITRITE POSITIVE 07/23/2012 1546   LEUKOCYTESUR NEGATIVE 02/21/2014 1255   Ct Head Wo Contrast  02/21/2014 CLINICAL DATA: ALTERED MENTAL STATUS, WEAKNESS. EXAM: CT HEAD WITHOUT CONTRAST TECHNIQUE: Contiguous axial images were obtained from the base of the skull through the vertex without intravenous contrast. COMPARISON: 04/26/2012 FINDINGS: No acute intracranial abnormality. Specifically, no hemorrhage, hydrocephalus, mass lesion, acute infarction, or significant intracranial injury. No acute calvarial  abnormality. Mucosal thickening in the floor the maxillary sinuses. No air-fluid levels. Mastoid air cells are clear. IMPRESSION: No acute intracranial abnormality. Electronically Signed By: Charlett Nose M.D. On: 02/21/2014 15:01   Dg Chest Port 1 View  02/21/2014 CLINICAL DATA: Altered mental status EXAM: PORTABLE CHEST - 1 VIEW COMPARISON: 02/20/2014 FINDINGS: Cardiac shadow is prominent and stable from the prior exam. Left-sided pleural effusion is again noted. Mild vascular congestion is now seen accentuated by poor inspiratory effort. IMPRESSION: Vascular congestion and left basilar changes slightly increased from the prior exam. Electronically Signed By: Alcide Clever M.D. On: 02/21/2014 14:50   Results/Tests Pending at Time of Discharge: None  Discharge Medications:    Medication List    STOP taking these medications       pregabalin 150 MG capsule  Commonly known as:  LYRICA      TAKE these medications       allopurinol 300 MG tablet  Commonly known as:  ZYLOPRIM  Take 300 mg by mouth daily.     aspirin EC 81 MG tablet  Take 81 mg by mouth daily.     calcium citrate 950 MG tablet  Commonly known as:  CALCITRATE - dosed in mg elemental calcium  Take 1 tablet (200 mg of elemental calcium total) by  mouth 2 (two) times daily.     carvedilol 3.125 MG tablet  Commonly known as:  COREG  Take 1 tablet (3.125 mg total) by mouth 2 (two) times daily with a meal.     cholecalciferol 1000 UNITS tablet  Commonly known as:  VITAMIN D  Take 1 tablet (1,000 Units total) by mouth daily.     ciprofloxacin 500 MG tablet  Commonly known as:  CIPRO  Take 1 tablet (500 mg total) by mouth daily.     citalopram 20 MG tablet  Commonly known as:  CELEXA  Take 20 mg by mouth daily.     fluticasone 50 MCG/ACT nasal spray  Commonly known as:  FLONASE  Place 2 sprays into both nostrils daily as needed for allergies or rhinitis.     furosemide 40 MG tablet  Commonly known as:  LASIX  Take  40-80 mg by mouth 2 (two) times daily. 80 mg in AM and 40 mg at 2 PM     LANTUS SOLOSTAR 100 UNIT/ML Solostar Pen  Generic drug:  Insulin Glargine  Inject 50 Units into the skin 2 (two) times daily.     multivitamin with minerals Tabs tablet  Take 1 tablet by mouth daily.     rOPINIRole 4 MG tablet  Commonly known as:  REQUIP  Take 4 mg by mouth at bedtime.     simvastatin 20 MG tablet  Commonly known as:  ZOCOR  Take 20 mg by mouth daily.        Discharge Instructions: Please refer to Patient Instructions section of EMR for full details.  Patient was counseled important signs and symptoms that should prompt return to medical care, changes in medications, dietary instructions, activity restrictions, and follow up appointments.   Follow-Up Appointments: Follow-up Information   Follow up with [facility] On 04/13/2014. ( )    Contact information:   [address] [place] [address] [phone]      Follow up with [name], MD On 03/06/2014. (  for hospital follow-up)    Specialties:  Family Medicine, Sports Medicine   Contact information:   [address] [place] [address] [phone]       Follow up with [facility] On 03/31/2014. ( :30pm )    Contact information:   [address] [place] [address] [phone]      Pincus Large, DO 02/23/2014, 11:57 AM PGY-1, [facility]

## 2014-02-23 NOTE — Discharge Instructions (Addendum)
Discharge Date: 02/23/2014  Reason for Hospitalization: Altered mental status -We believe the change was due to some of the new medications given at the last hospital visit. These medications have since been stopped. -Patient has a follow-up appointment at the Geriatric clinic to follow-up on mental status.  Please keep all follow-up appointments.  Thank you for letting us participate in your care!    Sleep study scheduled for April 13, 2014 at 8 pm   [facility]  [address] , [address] [place] , [place] [address]  Phone [phone]   Altered Mental Status Altered mental status most often refers to an abnormal change in your responsiveness and awareness. It can affect your speech, thought, mobility, memory, attention span, or alertness. It can range from slight confusion to complete unresponsiveness (coma). Altered mental status can be a sign of a serious underlying medical condition. Rapid evaluation and medical treatment is necessary for patients having an altered mental status. CAUSES   Low blood sugar (hypoglycemia) or diabetes.  Severe loss of body fluids (dehydration) or a body salt (electrolyte) imbalance.  A stroke or other neurologic problem, such as dementia or delirium.  A head injury or tumor.  A drug or alcohol overdose.  Exposure to toxins or poisons.  Depression, anxiety, and stress.  A low oxygen level (hypoxia).  An infection.  Blood loss.  Twitching or shaking (seizure).  Heart problems, such as heart attack or heart rhythm problems (arrhythmias).  A body temperature that is too low or too high (hypothermia or hyperthermia). DIAGNOSIS  A diagnosis is based on your history, symptoms, physical and neurologic examinations, and diagnostic tests. Diagnostic tests may include:  Measurement of your blood pressure, pulse, breathing, and oxygen levels (vital signs).  Blood  tests.  Urine tests.  X-ray exams.  A computerized magnetic scan (magnetic resonance imaging, MRI).  A computerized X-ray scan (computed tomography, CT scan). TREATMENT  Treatment will depend on the cause. Treatment may include:  Management of an underlying medical or mental health condition.  Critical care or support in the hospital. HOME CARE INSTRUCTIONS   Only take over-the-counter or prescription medicines for pain, discomfort, or fever as directed by your caregiver.  Manage underlying conditions as directed by your caregiver.  Eat a healthy, well-balanced diet to maintain strength.  Join a support group or prevention program to cope with the condition or trauma that caused the altered mental status. Ask your caregiver to help choose a program that works for you.  Follow up with your caregiver for further examination, therapy, or testing as directed. SEEK MEDICAL CARE IF:   You feel unwell or have chills.  You or your family notice a change in your behavior or your alertness.  You have trouble following your caregiver's treatment plan.  You have questions or concerns. SEEK IMMEDIATE MEDICAL CARE IF:   You have a rapid heartbeat or have chest pain.  You have difficulty breathing.  You have a fever.  You have a headache with a stiff neck.  You cough up blood.  You have blood in your urine or stool.  You have severe agitation or confusion. MAKE SURE YOU:   Understand these instructions.  Will watch your condition.  Will get help right away if you are not doing well or get worse. Document Released: 11/30/2009 Document Revised: 09/04/2011 Document Reviewed: 11/30/2009 [facility] Patient Information 2015 [place], [place]. This information is not intended  to replace advice given to you by your health care provider. Make sure you discuss any questions you have with your health care provider.

## 2014-02-23 NOTE — Discharge Summary (Signed)
FMTS Attending Note  I personally saw and evaluated the patient. The plan of care was discussed with the resident team. I agree with the assessment and plan as documented by the resident.   80 y/o female admitted with encephalopathy/delirium, Patient now back to baseline mental status, likely has baseline dementia, agree with follow up in Geriatrics clinic for further workup and sleep study to evaluate for OSA.  Donnella Sham MD

## 2014-02-23 NOTE — Progress Notes (Signed)
Patient refused CPAP.

## 2014-02-23 NOTE — Progress Notes (Signed)
Physical Therapy Treatment Patient Details Name: [name] [name] MRN: [id number] DOB: 07-19-1933 Today's Date: 02/23/2014    History of Present Illness Pt admitted 02-21-14 with AMS.  Previous recent admit 8-26 to 8-28 with fever and hypotension.    PT Comments    Patient progressing towards physical therapy goals, ambulating up to 60 feet today on room air - maintaining 92% SpO2 and greater with instructions for pursed lip breathing which promptly elevates SpO2 to 95%. Tolerating therapeutic exercises well and patient reports she typically does not ambulate further than distances covered today while at home. Patient will continue to benefit from skilled physical therapy services at home with HHPT to further improve independence with functional mobility.   Follow Up Recommendations  Home health PT;Supervision/Assistance - 24 hour     Equipment Recommendations       Recommendations for Other Services OT consult     Precautions / Restrictions Precautions Precautions: Fall Restrictions Weight Bearing Restrictions: No    Mobility  Bed Mobility                  Transfers Overall transfer level: Needs assistance Equipment used: Rolling walker (2 wheeled) Transfers: Sit to/from Stand Sit to Stand: Min guard         General transfer comment: Min guard for safety with VC for hand placement. Good stability upon standing with no loss of balance. Performed from recliner x2.  Ambulation/Gait Ambulation/Gait assistance: Min guard Ambulation Distance (Feet): 60 Feet (additonal bout of 50) Assistive device: Rolling walker (2 wheeled) Gait Pattern/deviations: Step-through pattern;Decreased stride length;Trunk flexed Gait velocity: decreased   General Gait Details: Ambulating on room air with SpO2 maintaining 92% or greater; responds well to pursed lip breathing instructions by quickly elevating SpO2 to 95%. VC for upright posture and walker placement. Required 1 standing rest  break during each bout.   Stairs            Wheelchair Mobility    Modified Rankin (Stroke Patients Only)       Balance                                    Cognition Arousal/Alertness: Awake/alert Behavior During Therapy: WFL for tasks assessed/performed Overall Cognitive Status: Within Functional Limits for tasks assessed                      Exercises General Exercises - Lower Extremity Ankle Circles/Pumps: AROM;Both;15 reps;Seated Long Arc Quad: Strengthening;Both;10 reps;Seated Hip Flexion/Marching: Strengthening;Both;10 reps;Seated    General Comments        Pertinent Vitals/Pain Pain Assessment: 0-10 Pain Score:  ("my back is always sore" no value given) Pain Location: Lower back Pain Intervention(s): Limited activity within patient's tolerance;Monitored during session;Repositioned    Home Living                      Prior Function            PT Goals (current goals can now be found in the care plan section) Acute Rehab PT Goals Patient Stated Goal: Go home today PT Goal Formulation: With patient Time For Goal Achievement: 03/01/14 Potential to Achieve Goals: Good Progress towards PT goals: Progressing toward goals    Frequency  Min 3X/week    PT Plan Current plan remains appropriate    Co-evaluation  End of Session   Activity Tolerance: Patient tolerated treatment well Patient left: in chair;with call bell/phone within reach     Time: 1032-1057 PT Time Calculation (min): 25 min  Charges:  $Gait Training: 8-22 mins $Therapeutic Activity: 8-22 mins                    G Codes:      [employer],  [phone]  Berton Mount 02/23/2014, 11:22 AM

## 2014-02-23 NOTE — Care Management Note (Signed)
Late Entry:CARE MANAGEMENT NOTE 02/23/2014  Patient:  [name],[name]   Account Number:  [id number]  Date Initiated:  02/20/2014  Documentation initiated by:  ,  Subjective/Objective Assessment:   CM following for progression and d/c planning.     Action/Plan:   02/20/2014 Met with pt re HH needs, [facility] selected for HHPT and HHOT. Pt has a rolling walker. Await eval for possible home oxygen needs.   Anticipated DC Date:     Anticipated DC Plan:  HOME W HOME HEALTH SERVICES         Choice offered to / List presented to:  C-1 Patient   DME arranged  [facility]      DME agency  [facility]     HH arranged  HH-2 PT  HH-3 OT      HH agency  [facility]   Status of service:  Completed, signed off Medicare Important Message given?  YES (If response is "NO", the following Medicare IM given date fields will be blank) Date Medicare IM given:  02/20/2014 Medicare IM given by:  , Date Additional Medicare IM given:   Additional Medicare IM given by:    Discharge Disposition:  HOME W HOME HEALTH SERVICES  Per UR Regulation:    If discussed at Long Length of Stay Meetings, dates discussed:    Comments:  02/20/2014 Pt states that she has a rolling walker, lives near daughter who checks on her frequenty thur the day. Currently on O2 following for possible need for home oxygen.   CRoyal RN MPH, case manager, [phone]   

## 2014-02-25 ENCOUNTER — Encounter: Payer: Self-pay | Admitting: Family Medicine

## 2014-02-25 ENCOUNTER — Ambulatory Visit (INDEPENDENT_AMBULATORY_CARE_PROVIDER_SITE_OTHER): Payer: PRIVATE HEALTH INSURANCE | Admitting: Family Medicine

## 2014-02-25 VITALS — BP 137/57 | HR 62 | Temp 97.8°F | Ht 60.0 in

## 2014-02-25 DIAGNOSIS — R6889 Other general symptoms and signs: Secondary | ICD-10-CM

## 2014-02-25 DIAGNOSIS — Z23 Encounter for immunization: Secondary | ICD-10-CM

## 2014-02-25 DIAGNOSIS — E1149 Type 2 diabetes mellitus with other diabetic neurological complication: Secondary | ICD-10-CM

## 2014-02-25 DIAGNOSIS — D510 Vitamin B12 deficiency anemia due to intrinsic factor deficiency: Secondary | ICD-10-CM

## 2014-02-25 DIAGNOSIS — G2581 Restless legs syndrome: Secondary | ICD-10-CM

## 2014-02-25 LAB — CULTURE, BLOOD (ROUTINE X 2)
Culture: NO GROWTH
Culture: NO GROWTH

## 2014-02-25 MED ORDER — GABAPENTIN 100 MG PO CAPS: 100.0000 mg | ORAL_CAPSULE | Freq: Every day | ORAL | Status: DC

## 2014-02-25 MED ORDER — CYANOCOBALAMIN 1000 MCG/ML IJ SOLN
1000.0000 ug | Freq: Once | INTRAMUSCULAR | Status: AC
  Administered 2014-02-25: 1000 ug via INTRAMUSCULAR

## 2014-02-25 NOTE — Patient Instructions (Signed)
I am adding gabapentin at night to help you sleep and help with the restless legs. You may continue the requip. Please keep your appointment with the GERIATRIC clinic here at the [facility] medicine center.

## 2014-02-26 NOTE — Assessment & Plan Note (Signed)
We'll continue her Requip. I do not think adding a lot of other medication is a good idea given her recent hospitalization for mental status changes. I will start with very low-dose gabapentin with no taper up. I discussed with her daughter the inappropriateness of using benzodiazepines for her. If after a few weeks of when her milligrams gabapentin each bedtime she still having symptoms, I would increase her gradually probably no more than 300 mg each bedtime max dose.

## 2014-02-26 NOTE — Assessment & Plan Note (Signed)
She doesn't bring any blood sugar readings with her she's not exactly sure what amount of insulin she's taking Sonata make any changes today I like to see her back in a month for followup of this. For her discharge summary looks like they discharged her home on the same dose with additional sliding scale does.

## 2014-02-26 NOTE — Assessment & Plan Note (Signed)
History of forgetfulness her mild dementia. Hard to know what her baseline is. This is only my second or third time seeing her and she appears to be the same as at last office visit.

## 2014-02-26 NOTE — Progress Notes (Signed)
   Subjective:    Patient ID: [name] [name], female    DOB: April 05, 1934, 80 y.o.   MRN: [id number]  HPI Hospital followup. She's had a couple of admissions the last few months. Most recently she was admitted for decreased mental responsiveness. At the prior hospital stay she had been discharged on the medicine, Lyrica. When they stopped that in this hospitalization she began mentating more normally. She is here with her daughter. #1. Main patient complaint is of insomnia at night. Her daughter would like her to have some type of sleeping pill, asks about thallium for Xanax. #2. Continues to have problems with restless leg syndrome and since they stopped the Lyrica she's having increasing problems with that. #3. Continues on home oxygen without problem. #4. Was treated for urinary tract infection or complications secondary to that and discharged home on 5 more doses of antibiotic, Cipro. She has completed that. She's having no urinary symptoms at this time.   Review of Systems See history of present illness above. Additional pertinent review of systems is negative for any new or worsening signs of depression, no change in respiratory status, no chest pain, no lower extremity edema, no urinary incontinence or dysuria, no fever, sweats, chills.    Objective:   Physical Exam  Vital signs are reviewed GENERAL: Elderly female no acute distress. She is walking with a walker. She is here with her daughter. HEENT: Neck is without lymphadenopathy or thyromegaly. Oropharynx appears without lesion. Conjunctivae nonicteric. Neck is supple. CV: Regular rate and rhythm without murmur EXTREMITY: No edema is noted. She has good capillary refill and normal warmth of her extremities. LUNGS: Clear to auscultation bilaterally PSYCHIATRIC: Alert and oriented x4. She asks and answers questions fairly well although she has a lot of confusion about her medications. Denies hallucination.      Assessment & Plan:

## 2014-02-27 LAB — CULTURE, BLOOD (ROUTINE X 2)
CULTURE: NO GROWTH
Culture: NO GROWTH

## 2014-03-09 ENCOUNTER — Other Ambulatory Visit: Payer: Self-pay | Admitting: Family Medicine

## 2014-03-26 ENCOUNTER — Other Ambulatory Visit: Payer: Self-pay | Admitting: Family Medicine

## 2014-03-31 ENCOUNTER — Telehealth: Payer: Self-pay | Admitting: Family Medicine

## 2014-03-31 ENCOUNTER — Ambulatory Visit: Payer: PRIVATE HEALTH INSURANCE

## 2014-03-31 NOTE — Telephone Encounter (Signed)
CSW contacted pt to inform her that CSW will provide PCP with a PCS form to determine whether pt qualifies for an aide. Once the PCS form is completed CSW will fax it to [facility] [place] who will contact pt for a home visit. Pt appreciative. Pt also confirmed that someone from [employer] will be going to her home tomorrow to assess her for services.   Theresia BoughNorma Rashena Dowling, MSW, LCSW [phone]

## 2014-03-31 NOTE — Telephone Encounter (Signed)
Form placed in PCP's box.  [name] BoughNorma Netta [name], MSW, LCSW [phone]

## 2014-03-31 NOTE — Telephone Encounter (Signed)
Will fwd to Dr. Jennette KettleNeal and also Nelva BushNorma about the [facility] [employer] wheels

## 2014-03-31 NOTE — Telephone Encounter (Signed)
Pt requesting PCS to help her take showers. Also, would like to know if MD could help her get help from [employer]. Pls advise.

## 2014-04-02 NOTE — Progress Notes (Signed)
PT evaluation addendum Late entry for 02/22/14 for gcodes   02/22/14 1310  PT G-Codes **NOT FOR INPATIENT CLASS**  Functional Assessment Tool Used clinical judgement/chart review  Functional Limitation Mobility: Walking and moving around  Mobility: Walking and Moving Around Current Status [phone](G8978) CJ  Mobility: Walking and Moving Around Goal Status [phone](G8979) CI  04/02/2014 Corlis HoveMargie Antario Yasuda, PT [phone]

## 2014-04-06 ENCOUNTER — Other Ambulatory Visit: Payer: Self-pay | Admitting: Family Medicine

## 2014-04-07 ENCOUNTER — Ambulatory Visit: Payer: PRIVATE HEALTH INSURANCE

## 2014-04-10 ENCOUNTER — Telehealth: Payer: Self-pay | Admitting: Family Medicine

## 2014-04-10 NOTE — Telephone Encounter (Signed)
Daughter called and was hoping that Dr. Jennette KettleNeal could call in something for her mother's headaches. She said that her mother is taking OTC medication but it is not helping and was hoping that something can be called in until she is seen in the Geriatric Clinic on the 27th. jw

## 2014-04-13 ENCOUNTER — Telehealth: Payer: Self-pay | Admitting: Family Medicine

## 2014-04-13 ENCOUNTER — Ambulatory Visit (HOSPITAL_BASED_OUTPATIENT_CLINIC_OR_DEPARTMENT_OTHER): Payer: PRIVATE HEALTH INSURANCE

## 2014-04-13 NOTE — Telephone Encounter (Signed)
LVM for patient's mother to call back 

## 2014-04-13 NOTE — Telephone Encounter (Signed)
I. I have never seen her for headaches and it is not on her chronic problem list so iI am not comfortable calling in anything. If she cannot wait until 27th, she should be seen on Same Day appt. Sorry--it is too risky to calli n pain relievers when you do not know what you are treating THANKS! Denny LevySara Hiran Leard

## 2014-04-13 NOTE — Telephone Encounter (Signed)
Daughter called and was informed about the message that Dr. Jennette KettleNeal left and they call if they would like a SDA appointment. jw

## 2014-04-21 ENCOUNTER — Encounter: Payer: Self-pay | Admitting: Clinical

## 2014-04-21 ENCOUNTER — Encounter: Payer: Self-pay | Admitting: Family Medicine

## 2014-04-21 ENCOUNTER — Ambulatory Visit: Payer: PRIVATE HEALTH INSURANCE

## 2014-04-21 DIAGNOSIS — I272 Pulmonary hypertension, unspecified: Secondary | ICD-10-CM

## 2014-04-21 HISTORY — DX: Pulmonary hypertension, unspecified: I27.20

## 2014-04-21 NOTE — Telephone Encounter (Signed)
Done Gave to [name] BirchwoodNorma Ishitha [name]

## 2014-04-21 NOTE — Progress Notes (Signed)
PCS form has been faxed to [facility]. Pt will received a call from [facility] for a home visit to determine whether pt qualifies for services.  Theresia BoughNorma Arriona Prest, MSW, LCSW [phone]

## 2014-04-24 ENCOUNTER — Other Ambulatory Visit (HOSPITAL_COMMUNITY): Payer: Self-pay | Admitting: Family Medicine

## 2014-04-27 ENCOUNTER — Telehealth: Payer: Self-pay | Admitting: *Deleted

## 2014-04-27 NOTE — Telephone Encounter (Signed)
LM for patient to call back.  Pt's appt was moved to the following Tuesday 05/05/14 for geri clinic.  Please inform her of this when she calls back.  Thanks Limited BrandsJazmin Hartsell,CMA

## 2014-04-28 ENCOUNTER — Ambulatory Visit: Payer: PRIVATE HEALTH INSURANCE

## 2014-05-03 ENCOUNTER — Other Ambulatory Visit: Payer: Self-pay | Admitting: Family Medicine

## 2014-05-05 ENCOUNTER — Ambulatory Visit (INDEPENDENT_AMBULATORY_CARE_PROVIDER_SITE_OTHER): Payer: PRIVATE HEALTH INSURANCE | Admitting: Family Medicine

## 2014-05-05 VITALS — BP 127/60 | HR 63 | Temp 97.7°F | Ht 60.0 in | Wt 220.0 lb

## 2014-05-05 DIAGNOSIS — R2689 Other abnormalities of gait and mobility: Secondary | ICD-10-CM

## 2014-05-05 DIAGNOSIS — R29818 Other symptoms and signs involving the nervous system: Secondary | ICD-10-CM

## 2014-05-05 DIAGNOSIS — R29898 Other symptoms and signs involving the musculoskeletal system: Secondary | ICD-10-CM

## 2014-05-05 DIAGNOSIS — G2581 Restless legs syndrome: Secondary | ICD-10-CM

## 2014-05-05 DIAGNOSIS — E11618 Type 2 diabetes mellitus with other diabetic arthropathy: Secondary | ICD-10-CM

## 2014-05-05 DIAGNOSIS — G3184 Mild cognitive impairment, so stated: Secondary | ICD-10-CM

## 2014-05-05 DIAGNOSIS — Z9181 History of falling: Secondary | ICD-10-CM

## 2014-05-05 DIAGNOSIS — M7500 Adhesive capsulitis of unspecified shoulder: Secondary | ICD-10-CM

## 2014-05-05 DIAGNOSIS — H547 Unspecified visual loss: Secondary | ICD-10-CM | POA: Insufficient documentation

## 2014-05-05 DIAGNOSIS — R269 Unspecified abnormalities of gait and mobility: Secondary | ICD-10-CM

## 2014-05-05 HISTORY — DX: History of falling: Z91.81

## 2014-05-05 HISTORY — DX: Type 2 diabetes mellitus with other diabetic arthropathy: E11.618

## 2014-05-05 HISTORY — DX: Adhesive capsulitis of unspecified shoulder: M75.00

## 2014-05-05 MED ORDER — ROPINIROLE HCL 4 MG PO TABS: 4.0000 mg | ORAL_TABLET | Freq: Every day | ORAL | Status: DC

## 2014-05-05 NOTE — Patient Instructions (Addendum)
You have a mild decrease in your thinking that is called Mild Cognitive Impairment.  To keep your mind sharp, take group activites that involve new and interesting topics.   Try to get walking in every day.  Use your rollator for balance in the house and outside the home.  You are taking both metoprolol and Carvedilol.  These two medications are alike.  Recommend you take only the Carvedilol that Dr Jennette KettleNeal prescribed you.   We will send a note to Dr Jennette KettleNeal about getting an order for diabetic shoes and refilling your  Requip medication.   Talk with your family about what you would want done for you should you stop breathing or your heart stops beating.   Put this into the "Living Will" and have it notarized.

## 2014-05-05 NOTE — Progress Notes (Signed)
Patient ID: [name] [name], female   DOB: 01/07/1934, 80 y.o.   MRN: [id number] [facility]:   Patient is accompanied by: daughter Primary caregiver: patient and daughter History obtained from: patient, daughter,  Primary Care Provider: Denny LevySara Neal, MD History Chief Complaint  Patient presents with  . Memory Loss  Patient and [name] [name] are uncertain the reason they were referred to Geriatric Clinic.   HPI by problems: Patient with MMSE during hosptialization 02/21/14 for altered mental status was 14.  Patient and [name] [name] not concerned about memory loss or dementia.   Head CT 02/21/14 with no acute abnormalities. TSH (06/2013) 2.312,  RPR- NR (04/2012)  Outpatient Encounter Prescriptions as of 05/05/2014  Medication Sig  . ACCU-CHEK COMPACT PLUS test strip   . allopurinol (ZYLOPRIM) 300 MG tablet TAKE 1 TABLET BY MOUTH DAILY  . aspirin EC 81 MG tablet Take 81 mg by mouth daily.  . carvedilol (COREG) 3.125 MG tablet Take 1 tablet (3.125 mg total) by mouth 2 (two) times daily with a meal.  . citalopram (CELEXA) 20 MG tablet TAKE 1 TABLET BY MOUTH EVERY DAY  . cyanocobalamin (,VITAMIN B-12,) 1000 MCG/ML injection Inject into muscle once every 3 months  . furosemide (LASIX) 40 MG tablet Take 40-80 mg by mouth 2 (two) times daily. 80 mg in AM and 40 mg at 2 PM  . Insulin Glargine (LANTUS SOLOSTAR) 100 UNIT/ML Solostar Pen Inject 50 Units into the skin 2 (two) times daily.  . metoprolol succinate (TOPROL-XL) 25 MG 24 hr tablet Take 25 mg by mouth daily.  Marland Kitchen. oxybutynin (DITROPAN) 5 MG tablet TAKE 1/2 TABLET BY MOUTH TWICE DAILY  . rOPINIRole (REQUIP) 4 MG tablet Take 4 mg by mouth at bedtime.  . simvastatin (ZOCOR) 20 MG tablet TAKE 1 TABLET BY MOUTH EVERY NIGHT AT BEDTIME  . [DISCONTINUED] simvastatin (ZOCOR) 20 MG tablet Take 20 mg by mouth daily.  . fluticasone (FLONASE) 50 MCG/ACT nasal spray Place 2 sprays into both nostrils daily as needed for allergies or rhinitis.   Marland Kitchen.  LANTUS SOLOSTAR 100 UNIT/ML Solostar Pen INJECT 50 UNITS INTO THE SKIN TWICE DAILY  . Multiple Vitamin (MULTIVITAMIN WITH MINERALS) TABS Take 1 tablet by mouth daily.  . [DISCONTINUED] allopurinol (ZYLOPRIM) 300 MG tablet Take 300 mg by mouth daily.  . [DISCONTINUED] ciprofloxacin (CIPRO) 500 MG tablet   . [DISCONTINUED] citalopram (CELEXA) 20 MG tablet Take 20 mg by mouth daily.  . [DISCONTINUED] furosemide (LASIX) 40 MG tablet TAKE 2 TABLETS BY MOUTH EVERY MORNING AND 1 TABLET DAILY AT 2 PM  . [DISCONTINUED] gabapentin (NEURONTIN) 100 MG capsule Take 1 capsule (100 mg total) by mouth at bedtime.   History Patient Active Problem List   Diagnosis Date Noted  . Pulmonary hypertension, mild 04/21/2014  . Spinal stenosis in cervical region 02/22/2014  . History of total left knee replacement 04/28/2013  . Trigger finger, acquired 04/28/2013  . Forgetfulness 08/27/2012  . Morbid obesity with BMI of 40.0-44.9, adult 04/30/2012  . Vitamin D deficiency 04/23/2012  . Kidney disease, chronic, stage III (GFR 30-59 ml/min) 07/14/2011  . 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 OSTEOPOROSIS 03/31/2010  . SPINAL STENOSIS, LUMBAR 03/10/2010  . GAIT DISTURBANCE 01/04/2010  . RESTLESS LEGS SYNDROME 07/16/2008  . DIABETES MELLITUS, TYPE II, CONTROLLED, W/NEURO COMPS 02/05/2008  . URINARY INCONTINENCE 11/14/2007  . ANEMIA, PERNICIOUS 03/11/2007  . HYPERLIPIDEMIA 08/23/2006  . DEPRESSIVE DISORDER, NOS 08/23/2006  . HYPERTENSION, BENIGN SYSTEMIC 08/23/2006  . VENOUS  INSUFFICIENCY, CHRONIC 08/23/2006  . GASTROESOPHAGEAL REFLUX, NO ESOPHAGITIS 08/23/2006  . OSTEOARTHRITIS, LOWER LEG 08/23/2006  . APNEA, SLEEP 08/23/2006   Past Medical History  Diagnosis Date  . History of cystoscopy 08/2005    normal  . Asthma   . Hypertension   . Diabetes mellitus without complication   . Neuropathy   . CHF (congestive heart failure)   . Sleep apnea   . Spinal stenosis in cervical region  02/22/2014  . Pulmonary hypertension, mild 04/21/2014    Echocardiogram 02/20/2014: estimated Pulmonary Artery Pressure 33 mmHg.    Past Surgical History  Procedure Laterality Date  . Cataract extraction w/ intraocular lens implant  2012    bilateral  . Total knee arthroplasty  02/2010    left   Family History  Problem Relation Age of Onset  . Alcohol abuse Son   . Alcohol abuse Daughter     died  . Diabetes Mother   . Diabetes Daughter   . Diabetes Sister   . Coronary artery disease Sister     MI  . Alzheimer's disease Sister     died of heart proble  . Diabetes Daughter   . Diabetes Daughter   . Diabetes Son   . Diabetes Son     reports that she has been passively smoking.  She does not have any smokeless tobacco history on file. She reports that she does not drink alcohol or use illicit drugs.  Basic Activities of Daily Living and Instrumental Activities of Daily Living Incontinent several times a day Uses walker to transfer and ambulate.  Does not need physical assistance to get out of chair or bed.  Independent in Toilet  in bathing, needs assistance with hair, back and legs because of difficulty reaching with right frozen shoulder Independent in dressing.    Able to prepare simple meals (oatmeal, sandwhiches, microwave) On waiting list for [employer].  Able to use telephone independently Still have driver's license.  Can drive if she needs to but [name] [name] does almost all the driving transportation of her Pt goes with family to do her shopping.  Unable to do housework secondary to back pain Takes medications on her own.  Manages her own medications  Not bothered by hearing difficulties. No problem with vision Rates her own health as good  Patient has ramp into her house  Pt lives in home behind her [name] [name].   Other family members involved in [name] [name] include her daughter, [name] [name] and her granddgt [name] [name].  [name] [name]'s son, [name] [name], lives in [place], [place].  She can  call on him for assistance.    Falls in the past six months:   No.   Health Maintenance reviewed: Immunization History  Administered Date(s) Administered  . Influenza Split 03/09/2011, 03/10/2011, 03/21/2012  . Influenza Whole 03/25/2007, 03/23/2008, 05/11/2009, 03/31/2010  . Influenza,inj,Quad PF,36+ Mos 04/28/2013, 02/25/2014  . Pneumococcal Polysaccharide-23 12/24/2005, 04/07/2012  . Td 08/24/2001  . Tdap 04/23/2012  . Zoster 10/10/2007   Health Maintenance Topics with due status: Overdue     Topic Date Due   FOOT EXAM 10/18/2012   URINE MICROALBUMIN 10/18/2012   OPHTHALMOLOGY EXAM 07/11/2013   MAMMOGRAM 10/31/2013    Diet: Regular Nutritional supplements: none  ROS Denies  changes in weight;  Denies changes in vision / hearing Denies runny nose  Denies chest congestion / wheezing;  Denies chest pain Denies dysuria; denies hematuria;  Denies abdominal discomfort Denies recent falls. (+) unsteady gait;  Denies unilateral weakness /  clumsiness / tingling / numbness; denies tremors;  Denies sadness / anxiety  (+) right shoulder unable raise above head.   Vision: OD 20/50, OS 20/40, OU 20/30  Vital Signs Weight: 220 lb (99.791 kg) Body mass index is 42.97 kg/(m^2). Estimated Creatinine Clearance: 35.4 mL/min (by C-G formula based on Cr of 1.39). Body surface area is 2.06 meters squared. Filed Vitals:   05/05/14 1358  BP: 127/60  Pulse: 63  Temp: 97.7 F (36.5 C)  TempSrc: Oral  Height: 5' (1.524 m)  Weight: 220 lb (99.791 kg)  SpO2: 92%   Wt Readings from Last 3 Encounters:  05/05/14 220 lb (99.791 kg)  02/19/14 229 lb 4.8 oz (104.01 kg)  11/19/13 232 lb 3.2 oz (105.325 kg)    Physical Examination:  VS reviewed GEN: Alert, Cooperative, Groomed, NAD, Walked into exam room using Rollator.  HEENT: EAC bilaterally not occluded, TM's translucent with normal LM, (+) LR;               COR: RRR, No M/G/R, No JVD, Normal PMI size and location LUNGS: BCTA, No  Acc mm use, speaking in full sentences Neuro: Oriented to person, place, and time; Strength: 5/5 Bil. UE and LE symmetric;  Cerebellar: Rhomberg negative; Muscle Tone normal; Tremor not present  Timed Up & Go Test: 24 seconds (High)  Sit-to-Stand Test: 8 / 30-seconds (Low)  4-Stage Stand Test:   Feet Side-by-side: Yes.        Feet Semi-tandem: Yes.        Feet Tandem: No.       Gait: No significant path deviation, only partial Step-through swing, (+) wide-based gait.   Psych: Normal affect/thought/speech/language   Mini-Mental State Examination: Total Score: MMSE - Mini Mental State Exam 02/22/2014 07/23/2012 02/08/2012  Orientation to time 2 4 5   Orientation to Place 5 5 5   Registration 3 3 3   Attention/ Calculation 0 4 2  Recall 0 1 2  Language- name 2 objects 2 2 2   Language- repeat 1 1 1   Language- follow 3 step command 3 3 3   Language- read & follow direction 1 1 1   Write a sentence 0 1 1  Copy design 0 1 1  Total score 17 26 26        Geriatric Depression Scale:  3 out of 15 (WNL)   Labs  Lab Results  Component Value Date   VITAMINB12 228 01/11/2007    Lab Results  Component Value Date   FOLATE  01/11/2007    6.9 (NOTE)  Reference Ranges        Deficient:       0.4 - 3.4 ng/mL        Indeterminate:   3.4 - 5.4 ng/mL        Normal:              > 5.4 ng/mL    Lab Results  Component Value Date   TSH 2.312 07/16/2013   RPR - Nonreactive (04/2012)     Chemistry      Component Value Date/Time   NA 139 02/22/2014 0429   K 4.6 02/22/2014 0429   CL 104 02/22/2014 0429   CO2 27 02/22/2014 0429   BUN 32* 02/22/2014 0429   CREATININE 1.39* 02/22/2014 0429   CREATININE 1.23* 11/19/2013 1205      Component Value Date/Time   CALCIUM 8.8 02/22/2014 0429   CALCIUM 7.9* 01/04/2007 1550   ALKPHOS 87 02/21/2014 1233   AST 45* 02/21/2014 1233  ALT 45* 02/21/2014 1233   BILITOT <0.2* 02/21/2014 1233       Lab Results  Component Value Date   HGBA1C 8.3*  02/19/2014      Lab Results  Component Value Date   WBC 7.0 02/23/2014   HGB 11.2* 02/23/2014   HCT 34.3* 02/23/2014   MCV 92.2 02/23/2014   PLT 147* 02/23/2014    No results found for this or any previous visit (from the past 24 hour(s)).  Assessment and Plan: Problem List Items Addressed This Visit      Unprioritized   Mild cognitive impairment - Primary - MoCA score of 20 out of 30 is in mild impairment range - No instrumental deficits due to cognitive impairments.  Deficits primarily due to musculoskeletal problems. - Recommend physical activity and group learning activities to prevent progression to dementia.  Information   given to patient about [facility] as a resource for group activities.  Pt expressed interest.  She   declined information about community transportation resources, saying she would use her children to   transport her.  - Recommend against acetylcholinesterase inhibitors in prevention of progression to dementia.     Vision decreased, Right eye, chronic - History of cataract surgery with IOL bilaterally in 2012.  - Recommend ophthalmologic evaluation   GAIT DISTURBANCE - Combination of proximal leg muscle weakness, imbalance and slow gait speed places patient at increased risk of falling.  - Pt recently completed Home Health Physical Therapy - Pt at risk for falls - Recommend ophthalmologic exam, continuous use of Rollator walker in and outside of home.  Proximal   Muscle strengthening exercises two to three times a day (Sit to Stand reps)  And tandem walking holding   onto kitchen counters, Consider scheduled APAP for osteoarthritis pain.  Recommended that patient stop   metoprolol and continue her Carvedilol.    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             Advanced Directives: Code Status:  Full  Contact:  Antionette Poles [phone] (home)    Patient to Follow up with Dr. [name]  Office visit for over 60 minutes with over 50% in counseling and coordination.

## 2014-05-13 ENCOUNTER — Encounter: Payer: Self-pay | Admitting: Family Medicine

## 2014-05-13 ENCOUNTER — Ambulatory Visit (INDEPENDENT_AMBULATORY_CARE_PROVIDER_SITE_OTHER): Payer: PRIVATE HEALTH INSURANCE | Admitting: Family Medicine

## 2014-05-13 VITALS — BP 143/46 | HR 66 | Temp 97.6°F | Ht 60.0 in | Wt 222.5 lb

## 2014-05-13 DIAGNOSIS — M7500 Adhesive capsulitis of unspecified shoulder: Secondary | ICD-10-CM

## 2014-05-13 DIAGNOSIS — R269 Unspecified abnormalities of gait and mobility: Secondary | ICD-10-CM

## 2014-05-13 DIAGNOSIS — D510 Vitamin B12 deficiency anemia due to intrinsic factor deficiency: Secondary | ICD-10-CM

## 2014-05-13 DIAGNOSIS — H547 Unspecified visual loss: Secondary | ICD-10-CM

## 2014-05-13 DIAGNOSIS — M48061 Spinal stenosis, lumbar region without neurogenic claudication: Secondary | ICD-10-CM

## 2014-05-13 DIAGNOSIS — E11618 Type 2 diabetes mellitus with other diabetic arthropathy: Secondary | ICD-10-CM

## 2014-05-13 DIAGNOSIS — M4806 Spinal stenosis, lumbar region: Secondary | ICD-10-CM

## 2014-05-13 DIAGNOSIS — Z23 Encounter for immunization: Secondary | ICD-10-CM

## 2014-05-13 MED ORDER — CYANOCOBALAMIN 1000 MCG/ML IJ SOLN
1000.0000 ug | Freq: Once | INTRAMUSCULAR | Status: AC
  Administered 2014-05-13: 1000 ug via INTRAMUSCULAR

## 2014-05-13 MED ORDER — CITALOPRAM HYDROBROMIDE 40 MG PO TABS: 40.0000 mg | ORAL_TABLET | Freq: Every day | ORAL | Status: AC

## 2014-05-13 NOTE — Patient Instructions (Signed)
For your headaches, insomnia and nervousness I am going to increase your celexa (citalopram) from 20 mg a day to 40 mg a day. If you have quite a few of the 20 mg tablets, it is OK to take 2 of those until you run out. Take them at the same time. When you run out, I am giving you a NEW RX for te 40 mg dose. Let's see how this works  I am also giving you a rx for diabetic shoes and I will send the paper work in for the scooter.

## 2014-05-26 ENCOUNTER — Telehealth: Payer: Self-pay | Admitting: Family Medicine

## 2014-05-26 NOTE — Telephone Encounter (Signed)
Would like a prescription for Valium. Needs something so she can rest

## 2014-05-27 MED ORDER — TRAZODONE HCL 50 MG PO TABS: 25.0000 mg | ORAL_TABLET | Freq: Every evening | ORAL | Status: DC | PRN

## 2014-05-27 NOTE — Telephone Encounter (Signed)
Pt called back and was checking on the status of her request for Valium. jw

## 2014-05-27 NOTE — Telephone Encounter (Signed)
Spoke with patient and informed her of below 

## 2014-05-27 NOTE — Telephone Encounter (Signed)
Dear [name] Team I am rx trazadone for sleep----much safer than valium in her age group. Have her try it and make an appt to see me in Jan if it is not working [employer]ney[employer]ANKS! Denny LevySara Neal

## 2014-05-27 NOTE — Progress Notes (Signed)
Subjective:    Patient ID: [name] [name], female    DOB: 01/05/1934, 80 y.o.   MRN: [id number]  HPI She is here for a mobility examination /evaluation. She is having much more problems with walking and transferring around the home. She is able to stand without assistance but having more difficulty with the one or two steps needed for transfer and having difficulty walking steadily with her walker. She complains of legs feeling 'tired" and has problems lifting her feet enough to ambulate and not be wobbly. .Especially since coming home from hospital she has had more difficulty getting around the house with her walker as she is unsteady and feels she cannot pick up her feet due to her leg fatigue. This is causing difficulty getting to toilet on time, causing occasional accidents and sheneeds help getting from bedroom to other living spaces if she has to change or add clothing.  She continues to be able to dress herself as long as she does not try to do any of it while standing Appetite unchanged  Review of Systems See HPI PERTINENT  PMH / PSH: I have reviewed the patient's medications, allergies, past medical and surgical history. Pertinent findings that relate to today's visit / issues include:  Patient Active Problem List   Diagnosis Date Noted  . Kidney disease, chronic, stage III (GFR 30-59 ml/min) 07/14/2011    Priority: High  . SPINAL STENOSIS, LUMBAR 03/10/2010    Priority: High  . DIABETES MELLITUS, TYPE II, CONTROLLED, W/NEURO COMPS 02/05/2008    Priority: High  . HYPERLIPIDEMIA 08/23/2006    Priority: High  . HYPERTENSION, BENIGN SYSTEMIC 08/23/2006    Priority: High  . APNEA, SLEEP 08/23/2006    Priority: High  . History of total left knee replacement 04/28/2013    Priority: Medium  . Morbid obesity with BMI of 40.0-44.9, adult 04/30/2012    Priority: Medium  . OSTEOPOROSIS 03/31/2010    Priority: Medium  . ANEMIA, PERNICIOUS 03/11/2007    Priority: Medium  .  DEPRESSIVE DISORDER, NOS 08/23/2006    Priority: Medium  . VENOUS INSUFFICIENCY, CHRONIC 08/23/2006    Priority: Medium  . GASTROESOPHAGEAL REFLUX, NO ESOPHAGITIS 08/23/2006    Priority: Medium  . Trigger finger, acquired 04/28/2013    Priority: Low  . Mild cognitive impairment 08/27/2012    Priority: Low  . Vitamin D deficiency 04/23/2012    Priority: Low  . CHRONIC GOUTY ARTHROPATHY W/O MENTION TOPHUS 07/19/2010    Priority: Low  . GAIT DISTURBANCE 01/04/2010    Priority: Low  . RESTLESS LEGS SYNDROME 07/16/2008    Priority: Low  . URINARY INCONTINENCE 11/14/2007    Priority: Low  . OSTEOARTHRITIS, LOWER LEG 08/23/2006    Priority: Low  . Vision decreased, Right eye 05/05/2014  . Diabetic frozen right shoulder associated with type 2 diabetes mellitus 05/05/2014  . At risk for falls 05/05/2014  . Pulmonary hypertension, mild 04/21/2014  . Spinal stenosis in cervical region 02/22/2014    Current Outpatient Prescriptions on File Prior to Visit  Medication Sig Dispense Refill  . ACCU-CHEK COMPACT PLUS test strip     . allopurinol (ZYLOPRIM) 300 MG tablet TAKE 1 TABLET BY MOUTH DAILY 90 tablet 0  . aspirin EC 81 MG tablet Take 81 mg by mouth daily.    . carvedilol (COREG) 3.125 MG tablet Take 1 tablet (3.125 mg total) by mouth 2 (two) times daily with a meal. 180 tablet 3  . cyanocobalamin (,VITAMIN B-12,) 1000 MCG/ML injection  Inject into muscle once every 3 months 1 mL 0  . fluticasone (FLONASE) 50 MCG/ACT nasal spray Place 2 sprays into both nostrils daily as needed for allergies or rhinitis.     . furosemide (LASIX) 40 MG tablet Take 40-80 mg by mouth 2 (two) times daily. 80 mg in AM and 40 mg at 2 PM    . Insulin Glargine (LANTUS SOLOSTAR) 100 UNIT/ML Solostar Pen Inject 50 Units into the skin 2 (two) times daily.    Marland Kitchen. LANTUS SOLOSTAR 100 UNIT/ML Solostar Pen INJECT 50 UNITS INTO THE SKIN TWICE DAILY 30 mL 12  . metoprolol succinate (TOPROL-XL) 25 MG 24 hr tablet Take 25 mg  by mouth daily.    . Multiple Vitamin (MULTIVITAMIN WITH MINERALS) TABS Take 1 tablet by mouth daily.    Marland Kitchen. oxybutynin (DITROPAN) 5 MG tablet TAKE 1/2 TABLET BY MOUTH TWICE DAILY 90 tablet 3  . rOPINIRole (REQUIP) 4 MG tablet TAKE 1 TABLET BY MOUTH AT BEDTIME 180 tablet 0  . rOPINIRole (REQUIP) 4 MG tablet Take 1 tablet (4 mg total) by mouth at bedtime. 30 tablet 3  . simvastatin (ZOCOR) 20 MG tablet TAKE 1 TABLET BY MOUTH EVERY NIGHT AT BEDTIME 90 tablet 3  . [DISCONTINUED] VESICARE 5 MG tablet TAKE ONE TABLET BY MOUTH ONE TIME DAILY 30 tablet 11   No current facility-administered medications on file prior to visit.        Objective:   Physical Exam  GEN: WD female, no acute distress. She is here with her daughter. Cooperative. CV: RRR no murmur LUNGS: CTA B. No wheeze NEURO:Cerebellar: Rhomberg negative; Muscle Tone normal; Tremor not present MSK: strength Upper and lower extremity is symmetrical and full in bicep B. specifically she is  4/5 at hip flexors and  knee extension andis 5/5 knee flexion. 4-5/5 plantar and dorsiflexion Bilaterlaly. ROM full at hips and  Knee B.  SHOULDR: Strength 4/5 within ROM as below. Right shoulder limited to 30 degrees forward flexion, 45 degrees lateral abduction. .Left shoulder FROM  Sit to stand--difficulty rising ---uses  Chair arms for assistance and  Timed up and go test is 26 seconds. Sit to stand test is 8/30 seconds. 4-Stage Stand Test:  Feet Side-by-side: Yes.   Feet Semi-tandem: Yes.   Feet Tandem: No. GAIT: using walker is shuffling, bent forward posture, slow and not steady with any attempt at turn (such as turn to sit). No significant path deviation, only partial Step-through swing, (+) wide-based gait.Marland Kitchen. SKIN: no rash, no pressure sores noted.       Assessment & Plan:   GAIT DISTURBANCE - Combination of proximal leg muscle weakness, imbalance and slow gait speed places patient at increased risk of falling.   - Pt at  risk for falls Patient having difficulty ambulating to toilet, from room to room. Dan HumphreysWalker is not steadying her enough for fall risk to be negated.  Patient will not be able to use manual wheelchair due to decreased UE strength and loss of shoulder ROM on right (adhesive capsulitis); cannot use a POV or cane secondary to postural instability. Patient will be able to safely operate a power wheelchair and is motivated and willing to use apower mobility device such as a power wheelchair.

## 2014-06-04 ENCOUNTER — Other Ambulatory Visit: Payer: Self-pay | Admitting: Family Medicine

## 2014-06-04 ENCOUNTER — Telehealth: Payer: Self-pay | Admitting: Family Medicine

## 2014-06-04 NOTE — Telephone Encounter (Signed)
[facility] and was checking on the status of the paperwork they faxed for the patient. jw

## 2014-06-05 ENCOUNTER — Telehealth: Payer: Self-pay | Admitting: *Deleted

## 2014-06-05 NOTE — Telephone Encounter (Signed)
Pt called stating her blood glucose levels have been off. This morning it was 130, after she ate lunch (plain chicken sandwich) it was 343. She took her Lantus.  Pt stated she takes 50 Units in the AM and 50 Units in the PM.  Pt asked how long her blood glucose levels have been going up and down she stated for a while.  Pt only uses the Lantus Solostar Pen.  Advise pt she need to schedule an appt with the provider to discuss her blood glucose and medications.  Pt waiting to hear back from doctor. Will forward to PCP for further advise.  Clovis PuMartin, [name] L, RN

## 2014-06-08 NOTE — Telephone Encounter (Signed)
Dear [name] [name] Team What does this mean? Does she need refills or is her insulin expired? Let me know THANKS! [name] [name]

## 2014-06-08 NOTE — Telephone Encounter (Signed)
Pt says her Novolog pen is out of date. Please advise

## 2014-06-09 NOTE — Telephone Encounter (Signed)
Returned call to pt regarding Novolog Pen.  Informed pt that the only insulin pen listed on current medication list is her Lantus SolorStar Pen.  Pt asked if the SoloStar Pen had expired; pt denies.  Pt requested a refill on her inhaler.  Will forward to PCP.  Clovis PuMartin, [name] L, RN

## 2014-06-10 ENCOUNTER — Other Ambulatory Visit: Payer: Self-pay | Admitting: *Deleted

## 2014-06-10 MED ORDER — ALBUTEROL SULFATE HFA 108 (90 BASE) MCG/ACT IN AERS: 2.0000 | INHALATION_SPRAY | RESPIRATORY_TRACT | Status: DC | PRN

## 2014-06-12 ENCOUNTER — Other Ambulatory Visit: Payer: Self-pay | Admitting: *Deleted

## 2014-06-12 MED ORDER — ALLOPURINOL 300 MG PO TABS: 300.0000 mg | ORAL_TABLET | Freq: Every day | ORAL | Status: DC

## 2014-06-19 ENCOUNTER — Other Ambulatory Visit: Payer: Self-pay | Admitting: Family Medicine

## 2014-06-22 ENCOUNTER — Telehealth: Payer: Self-pay | Admitting: Family Medicine

## 2014-06-22 NOTE — Telephone Encounter (Signed)
Pt also asking to speak with someone about her trazadone, has read the medication has many side effects but pt doesn't want to just stop taking it abruptly.

## 2014-06-22 NOTE — Telephone Encounter (Signed)
Pt checking status of rx for pullups, says the ensure was approved but the pullups were not, if MD approves [facility] at [phone]

## 2014-06-22 NOTE — Progress Notes (Signed)
Patient ID: [name] [name], female   DOB: 06/04/1934, 80 y.o.   MRN: [id number] CLARIFICATION: I think Ms [name] [name] WILL be able to successfully and safely transfer in and out of scooter or power wheelchair.She is currently using a wheelchair and occasionally a walker; with the walker she can ambulate less than 10 feet safely;  she can pivot or  transfer  from chair to wheelchair safely. She cannot propel the manual wheelchair secondary to her reduced respiratory status (pulmonary hypertension) and her musculoskeletal issues (frozen shoulder.)

## 2014-06-23 ENCOUNTER — Encounter: Payer: Self-pay | Admitting: Family Medicine

## 2014-06-23 NOTE — Progress Notes (Signed)
Patient ID: [name], female   DOB: 02/05/1934, 80 y.o.   MRN: [id number] Faxed rx for ; PT/OT power wheelchair evaluation to Heather Hartsell at [employer]:[phone]

## 2014-06-23 NOTE — Telephone Encounter (Signed)
It is OK to stop taking it. [name] LevySara Shaquon [name]

## 2014-06-24 NOTE — Telephone Encounter (Signed)
Spoke with patient and informed her of below 

## 2014-06-29 ENCOUNTER — Telehealth: Payer: Self-pay | Admitting: Family Medicine

## 2014-06-29 NOTE — Telephone Encounter (Signed)
Pt called because she said that her legs are fidgety and the medication that was given to doesn't work. She would like the doctor to call her. jw

## 2014-06-29 NOTE — Telephone Encounter (Signed)
Spoke with patient and spoke with her about below issue. Per Dr. [name] we will have patient to start 1/2 dose of her Trazodone again for 2 weeks. Patient requested Valium but due to her age it is not a good choice to prescribe. patient has been notified this and will let us know how going back on medication does.

## 2014-07-06 ENCOUNTER — Telehealth: Payer: Self-pay | Admitting: Family Medicine

## 2014-07-06 NOTE — Telephone Encounter (Signed)
Pt was prescribed ensure but it has too much sugar, would like to know if the doctor can send in something else.

## 2014-07-07 NOTE — Telephone Encounter (Signed)
Called patient, I told her she can buy the OTC ensure but she is requesting an Rx so it will be cheaper. She also says she needs an Rx sent in for her pull ups.Busick, Rodena Medinobert Lee

## 2014-07-07 NOTE — Telephone Encounter (Signed)
Dear [name] Team You can buy diabetic ensure OTC THANKS!  [name] LevySara Mylie Mccurley

## 2014-07-08 ENCOUNTER — Telehealth: Payer: Self-pay | Admitting: Family Medicine

## 2014-07-08 NOTE — Telephone Encounter (Signed)
Dear [name] [name] Team See previous phone mssg Ok I will try sending in her rx for sugar free ensure--WHERE does she want it Same with pull ups--is it a HH complany or an pharmacy where she gets them? THANKS! Denny LevySara Jaidah Lomax

## 2014-07-10 NOTE — Telephone Encounter (Signed)
Dear [name] Team WHERE? Denny LevySara Ulanda Tackett

## 2014-07-13 ENCOUNTER — Other Ambulatory Visit: Payer: Self-pay | Admitting: Family Medicine

## 2014-07-15 ENCOUNTER — Encounter: Payer: Self-pay | Admitting: Family Medicine

## 2014-07-15 ENCOUNTER — Ambulatory Visit (INDEPENDENT_AMBULATORY_CARE_PROVIDER_SITE_OTHER): Payer: Medicare Other | Admitting: Family Medicine

## 2014-07-15 VITALS — BP 122/50 | HR 63 | Temp 98.2°F | Ht 60.0 in | Wt 218.3 lb

## 2014-07-15 DIAGNOSIS — N3946 Mixed incontinence: Secondary | ICD-10-CM | POA: Diagnosis not present

## 2014-07-15 DIAGNOSIS — E1149 Type 2 diabetes mellitus with other diabetic neurological complication: Secondary | ICD-10-CM

## 2014-07-15 DIAGNOSIS — G4700 Insomnia, unspecified: Secondary | ICD-10-CM | POA: Diagnosis not present

## 2014-07-15 DIAGNOSIS — G2581 Restless legs syndrome: Secondary | ICD-10-CM | POA: Diagnosis not present

## 2014-07-15 DIAGNOSIS — D510 Vitamin B12 deficiency anemia due to intrinsic factor deficiency: Secondary | ICD-10-CM | POA: Diagnosis not present

## 2014-07-15 LAB — POCT GLYCOSYLATED HEMOGLOBIN (HGB A1C): HEMOGLOBIN A1C: 8.8

## 2014-07-15 MED ORDER — AMITRIPTYLINE HCL 25 MG PO TABS: 25.0000 mg | ORAL_TABLET | Freq: Every evening | ORAL | Status: DC | PRN

## 2014-07-15 MED ORDER — CYANOCOBALAMIN 1000 MCG/ML IJ SOLN
1000.0000 ug | Freq: Once | INTRAMUSCULAR | Status: AC
  Administered 2014-07-15: 1000 ug via INTRAMUSCULAR

## 2014-07-15 NOTE — Assessment & Plan Note (Signed)
I would continue her on the current dose of oxybutynin. I hesitate to increase the dose because she would have more side effects. She is already using adult diapers and we have recently refilled those.

## 2014-07-15 NOTE — Assessment & Plan Note (Signed)
We'll continue the Requip.

## 2014-07-15 NOTE — Progress Notes (Signed)
   Subjective:    Patient ID: [name] [name], female    DOB: 10/19/1933, 81 y.o.   MRN: [id number]  HPI #1. Restless legs and insomnia. She continues on the Requip which helps some with her restless legs, about 50-75% improvement. Says she still having trouble going to sleep. I had tried her on gabapentin and that did not help plus she read the handout was worried about the side effects. She is here with her daughter and granddaughter today and they both request that she be placed on some type of Valium or low-dose Ativan to help her sleep. #2. Has questions about physical therapy. Evidently she was getting some home physical therapy but she's not sure what that was for you ordered it. She said it was helpful in getting her up and moving more around the house. When they don't come she sits a lot and gets stiff. She continues to use a rolling walker. Even though he filled out multiple forms for her scooter, she says she's her nothing about that. We had also set up for home health aide and she's decided she doesn't want that after all. #3. Urinary incontinence. She continues on the oxybutynin but is having some increase in symptoms of increasing frequency. She is using depends her adult diapers only recently refilled those for her. She gets them her advanced home care by her report.  Review of Systems See history of present illness above. Additional part review of systems reveals no unusual weight change, no fever, sweats, chills and no recent falls. Denies cough, no chest pain, no shortness of breath.    Objective:   Physical Exam Vital signs reviewed. GENERAL: Well-developed, well-nourished, no acute distress. CARDIOVASCULAR: Regular rate and rhythm no murmur gallop or rub LUNGS: Clear to auscultation bilaterally, no rales or wheeze. Slightly decreased breath sounds throughout. ABDOMEN: Soft positive bowel sounds MSK: Movement of extremity x 4. She ambulates with use of a rolling walker. She can  rise from a chair without assistance from anyone but does use her walker for assistance. PSYCHIATRIC: Alert and oriented 4.. She seems to understand the questions I'm asking her answers appropriately most of the time but looks of her daughter granddaughter to answer some of them. No psychomotor agitation or retardation.         Assessment & Plan:

## 2014-07-15 NOTE — Assessment & Plan Note (Signed)
Her A1c was up to 8.8 today. This is an increase for her. She admits to "cheating" quite a bit over the holidays. Discussed with her, her daughter and her granddaughter all of them were at her visit today. No medication changes but recheck in 3 months.

## 2014-07-15 NOTE — Assessment & Plan Note (Signed)
Repeated request by her and her family for benzodiazepine-type medication. I do not think this is safe given her comorbidities and age. I'm also curious why her family seems so intent on requesting this for her. I will try her on low-dose amitriptyline to see if that helps. I do not think she's a candidate for benzodiazepine or Ambien or other such medication. I discussed this again with them today at length.

## 2014-07-15 NOTE — Telephone Encounter (Signed)
Ms. [name] [name] called regarding a medication Dr. Jennette KettleNeal was to have called to pharmacy.

## 2014-07-16 NOTE — Telephone Encounter (Signed)
Dear [name] Team I sent the amitriptylene to [facility]. Dbl check for me with her--[facility] Denny LevySara Kathi Dohn

## 2014-07-16 NOTE — Telephone Encounter (Signed)
That is where they wanted the rx sent to

## 2014-07-21 DIAGNOSIS — E1340 Other specified diabetes mellitus with diabetic neuropathy, unspecified: Secondary | ICD-10-CM | POA: Diagnosis not present

## 2014-07-21 DIAGNOSIS — R32 Unspecified urinary incontinence: Secondary | ICD-10-CM | POA: Diagnosis not present

## 2014-08-13 ENCOUNTER — Telehealth: Payer: Self-pay | Admitting: Family Medicine

## 2014-08-13 NOTE — Telephone Encounter (Signed)
Needs dr Jennette Kettleneal to authorize her Ensure Please advise pt when it is done

## 2014-08-18 ENCOUNTER — Ambulatory Visit (INDEPENDENT_AMBULATORY_CARE_PROVIDER_SITE_OTHER): Payer: Medicare Other | Admitting: Family Medicine

## 2014-08-18 ENCOUNTER — Encounter: Payer: Self-pay | Admitting: Family Medicine

## 2014-08-18 VITALS — BP 145/48 | HR 71 | Temp 98.1°F | Ht 60.0 in | Wt 220.4 lb

## 2014-08-18 DIAGNOSIS — J988 Other specified respiratory disorders: Secondary | ICD-10-CM | POA: Diagnosis not present

## 2014-08-18 MED ORDER — LEVOFLOXACIN 250 MG PO TABS: 250.0000 mg | ORAL_TABLET | Freq: Every day | ORAL | Status: DC

## 2014-08-18 NOTE — Patient Instructions (Addendum)
Thank you for coming in, today!  I think you probably had a virus that started everything. Based on your exam, I think it might be turning into a bronchitis or a pneumonia. I want you to take an antibiotic called Levaquin (levofloxacin) for 10 days. Take 2 pills (500 mg) once today, then take 1 pill (250 mg) for 9 more days. You can keep taking the Mucinex. Come back to see Dr. Jennette KettleNeal once you're finished with the antibiotics.  If you have any of the following symptoms, call us back or go to the emergency room: - worse cough, shortness of breath, fever, chills, vomiting, etc - chest pain, body aches, or overall worse symptoms  I would recommend that NO ONE smoke inside your house. If they do smoke at your house, ask them to go outside.  Please feel free to call with any questions or concerns at any time, at [phone]. --Dr. Casper HarrisonStreet

## 2014-08-18 NOTE — Progress Notes (Signed)
   Subjective:    Patient ID: [name] [name], female    DOB: 05/04/1934, 81 y.o.   MRN: [id number]  HPI: [facility] about 1 week of phlegmy cough. Her cough is severe and productive of greenish, brown phlegm. She has been taking Mucinex-DM which has loosened her phlegm some, but she has had not much relief. She doesn't think she has had fever at home but has had some chills. She has no N/V, change in bowel / bladder habits (does have some stress incontinence). She has no chest pain.  Of note, pt has COPD "from second-hand smoke" and was on oxygen at home previously but stopped 2-3 months ago.  Review of Systems: As above.     Objective:   Physical Exam BP 145/48 mmHg  Pulse 71  Temp(Src) 98.1 F (36.7 C) (Oral)  Ht 5' (1.524 m)  Wt 220 lb 6.4 oz (99.973 kg)  BMI 43.04 kg/m2  SpO2 96% Gen: elderly adult female in NAD, appears chronically ill HEENT: [place]/AT, EOMI, PERRLA, MM slightly dry, TM's clear Neck: supple, minimal shotty lymphadenopathy Cardio: RRR, no murmur appreciated Pulm: relatively clear bilaterally with poor inspiratory effort and mild transmitted upper airway congestion  Mildly reduced breath sounds at bases, right worse than left  Pt speaking in full sentences though deep breaths for exam do elicit cough Abd: soft, nontender, BS+ Ext: baseline LE edema bilaterally (unchanged from chronically, per pt)     Assessment & Plan:  81yo female with cough and coryza-type symptoms for several days, now with productive cough and questionable subjective fevers - no frank hypoxia and afebrile in clinic with very loud congestion and raspy cough - overall consistent with viral URI now with possible superimposed bacterial infection given diminished right lung base findings  Plan: - given age, comorbidities, and duration of symptoms with severity of cough, prefer to treat with abx - defer imaging or labs for now as clinically pt does not appear that ill and is not  hypoxic - Rx for Levaquin (renally adjusted), 500 mg day 1, then 250 mg daily for 10 total days - discussed red flags (worsening cough, fever, SOB, systemic symptoms, etc) that would indicate prompt return to clinic or presentation to the ED - advised f/u with PCP Dr. Jennette KettleNeal in about 2 weeks  Bobbye Mortonhristopher M Coti Burd, MD PGY-3, [facility] 08/18/2014, 4:22 PM

## 2014-08-20 NOTE — Telephone Encounter (Signed)
Done and faxed Stanislawa Gaffin  

## 2014-08-25 DIAGNOSIS — E1340 Other specified diabetes mellitus with diabetic neuropathy, unspecified: Secondary | ICD-10-CM | POA: Diagnosis not present

## 2014-08-25 DIAGNOSIS — R32 Unspecified urinary incontinence: Secondary | ICD-10-CM | POA: Diagnosis not present

## 2014-09-08 ENCOUNTER — Encounter: Payer: Self-pay | Admitting: Family Medicine

## 2014-09-08 NOTE — Progress Notes (Signed)
Amy called and left message on medical records line concerning power wheelchair.  They are needing chart notes and prescription.  Please call her at [phone] ext [phone]

## 2014-09-09 ENCOUNTER — Ambulatory Visit (INDEPENDENT_AMBULATORY_CARE_PROVIDER_SITE_OTHER): Payer: Medicare Other | Admitting: Family Medicine

## 2014-09-09 ENCOUNTER — Telehealth: Payer: Self-pay | Admitting: Family Medicine

## 2014-09-09 VITALS — BP 155/47 | HR 68 | Temp 97.9°F | Wt 220.3 lb

## 2014-09-09 DIAGNOSIS — R05 Cough: Secondary | ICD-10-CM | POA: Diagnosis not present

## 2014-09-09 DIAGNOSIS — L600 Ingrowing nail: Secondary | ICD-10-CM | POA: Diagnosis not present

## 2014-09-09 DIAGNOSIS — D510 Vitamin B12 deficiency anemia due to intrinsic factor deficiency: Secondary | ICD-10-CM

## 2014-09-09 DIAGNOSIS — R059 Cough, unspecified: Secondary | ICD-10-CM

## 2014-09-09 DIAGNOSIS — E1149 Type 2 diabetes mellitus with other diabetic neurological complication: Secondary | ICD-10-CM | POA: Diagnosis not present

## 2014-09-09 MED ORDER — BECLOMETHASONE DIPROPIONATE 40 MCG/ACT IN AERS: 1.0000 | INHALATION_SPRAY | Freq: Two times a day (BID) | RESPIRATORY_TRACT | Status: DC

## 2014-09-09 NOTE — Progress Notes (Signed)
Seen in clinic today will fax over records

## 2014-09-09 NOTE — Telephone Encounter (Signed)
Called because she needs Chart notes and the actual Rx for the wheelchair from the DOS when wheelchair was prescribed send to fax no. [phone] and call Amy when sent please / thanks Dorothey BasemanSadie [name], ASA

## 2014-09-11 ENCOUNTER — Ambulatory Visit
Admission: RE | Admit: 2014-09-11 | Discharge: 2014-09-11 | Disposition: A | Discharge status: Home / Self Care | Payer: Medicare Other | Source: Ambulatory Visit | Attending: Family Medicine | Admitting: Family Medicine

## 2014-09-11 DIAGNOSIS — R059 Cough, unspecified: Secondary | ICD-10-CM

## 2014-09-11 DIAGNOSIS — R05 Cough: Secondary | ICD-10-CM

## 2014-09-11 DIAGNOSIS — R0602 Shortness of breath: Secondary | ICD-10-CM | POA: Diagnosis not present

## 2014-09-11 NOTE — Assessment & Plan Note (Signed)
I'm really hasn't to change any of her medications as I think would end up with hypoglycemia. Again discussed with her appropriate dietary management.

## 2014-09-11 NOTE — Assessment & Plan Note (Signed)
B12 shot today at her request

## 2014-09-11 NOTE — Progress Notes (Signed)
   Subjective:    Patient ID: [name] [name], female    DOB: 08/16/1933, 81 y.o.   MRN: [id number]  HPI She's continued to have cough is slightly productive of greenish sputum. She was on a round of antibiotics and seemed to improve with that but she has continued to have the cough. She's not using her oxygen all the time now says she keeps an eye out with the pulse ox and when it's greater than 95% she doesn't feel like she needs it. She does not have it with her today. #2. Has a toenail that's bothering her. She was cutting them and she thinks she may have left a little piece that's embedded. Wants me look at that. #3. Says she's due her B12 shot #4. She has questions about how she should be taking her fluid pill #5. Her blood sugars have been olive the place. She is here with her granddaughter who tells me that Sunday she eats is much cake and ice cream as she can find and that's the days her blood sugar is up in the 300s and 400s.   Review of Systems Positive for cough as per history of present illness. Shortness of breath is baseline or even perhaps a little bit improved. She's had no chest pains. No lower extremity edema.    Objective:   Physical Exam Vital signs are reviewed GEN.: Well-developed female no acute distress she is walking with a walker. CV: Regular rate and rhythm LUNGS: Clear to auscultation without any expiratory wheeze. There is no crackle. She has good air movement in all lung fields. NECK: No lymphadenopathy or thyromegaly noted. EXTREMITY: Her right great toenail has a small area of that's ingrown at the medial border. Excised she clipped part of the left part of it. There is no sign of infection.  Procedure: I trimmed the portion of the nail that was left. She may ultimately need to get this toenail partially removed as it looks like it's chronically ingrown. Review of her glucometer shows readings from 150-400. It does look like they're episodically high on  certain days and then reasonable on other days.      Assessment & Plan:  #1. Cough. I think this is part of her COPD. I put her back on an inhaler. I will get a chest x-ray to make sure were not dealing with some type of chronic lung infection. #2. B12 shot today #3. Trimmed her toenails.

## 2014-09-14 NOTE — Telephone Encounter (Signed)
OK we have sentthe notes and now I am giving you a copy of the [employer] [facility] I did for her in November (scanned 05/13/2014), if this is a different company then she ne3eds to send me whatever form itis that she needs because this is all I have. THANKS! Denny LevySara Neal

## 2014-09-16 ENCOUNTER — Encounter: Payer: Self-pay | Admitting: Family Medicine

## 2014-09-22 ENCOUNTER — Other Ambulatory Visit: Payer: Self-pay | Admitting: Family Medicine

## 2014-09-22 NOTE — Telephone Encounter (Signed)
Pt called because she needs a new glucometer and all the supplies that go with this. She thinks that the one she has is broken. jw

## 2014-09-23 DIAGNOSIS — R32 Unspecified urinary incontinence: Secondary | ICD-10-CM | POA: Diagnosis not present

## 2014-09-23 DIAGNOSIS — E1340 Other specified diabetes mellitus with diabetic neuropathy, unspecified: Secondary | ICD-10-CM | POA: Diagnosis not present

## 2014-09-23 NOTE — Telephone Encounter (Signed)
Dear [name] AstersWhite Team Find out what type of glucometer she has now--I can give her a rx for same kind--that way she does NOT need additional rx for strips etc. ALSO, let her know that her insurance MAY only pay for one a year. THANKS! [name] LevySara Rue [name]

## 2014-09-25 NOTE — Telephone Encounter (Signed)
Spoke with patient and informed her of below. She states that she uses the Accu Check Compact Plus. i did inform her that insurance may only pay for 1 a year and that if she is concerned that she may not know and understand meter to make an appointment with Paulino RilyPete Koval

## 2014-09-30 ENCOUNTER — Ambulatory Visit (INDEPENDENT_AMBULATORY_CARE_PROVIDER_SITE_OTHER): Payer: Medicare Other | Admitting: Family Medicine

## 2014-09-30 ENCOUNTER — Telehealth: Payer: Self-pay | Admitting: Family Medicine

## 2014-09-30 ENCOUNTER — Encounter: Payer: Self-pay | Admitting: Family Medicine

## 2014-09-30 VITALS — BP 136/40 | HR 75 | Ht 60.0 in | Wt 219.8 lb

## 2014-09-30 DIAGNOSIS — E1149 Type 2 diabetes mellitus with other diabetic neurological complication: Secondary | ICD-10-CM | POA: Diagnosis not present

## 2014-09-30 NOTE — Telephone Encounter (Signed)
Sent fax on 09/25/2014 to authorize Rx for pt's power wheelchair. She is calling and checking on the status of the form / thanks HoneywellSadie [name], ASA

## 2014-10-01 ENCOUNTER — Other Ambulatory Visit: Payer: Self-pay | Admitting: Family Medicine

## 2014-10-01 NOTE — Progress Notes (Signed)
   Subjective:    Patient ID: [name] [name], female    DOB: 04/02/1934, 81 y.o.   MRN: [id number]  HPI Patient here with her granddaughter for follow-up of diabetes mellitus. Says her sugars have been all over the place. She had one low sugar of 60 and felt very symptomatic. It resolved with eating. She takes her insulin regularly. She does not eat regularly. When she does eat, according to her granddaughter she frequently eats ill-advised foods such as this morning's breakfast of back of a potato chips and 4 sugar-free cookies. She gets Meals on Wheels and uses that for dinner. Sounds like she has a fairly high intake of starch foods. She's also having trouble with her glucometer and think she may need a new one. We looked at it at last office visit and could not seem to reset the date on it.   Review of Systems She's had no unusual change in urination, no unusual thirst, no unexpected weight change. For additional pertinent review of systems please see history of present illness above.    Objective:   Physical Exam  Vital signs are reviewed and her fairly large pulse pressure is noted. GEN.: Well-developed female no acute distress walking with a walker. Psych: Alert and oriented 4. She has no psychomotor retardation, no agitation. She asks and answers questions appropriately. Her speech is normal and fluency in content. CV: Regular rate and rhythm without murmur LUNGS: Clear to auscultation bilaterally      Assessment & Plan:

## 2014-10-01 NOTE — Assessment & Plan Note (Signed)
I am has sent to increase her insulin coverage when her food intake is so erratic. I have scheduled her with pharmacy clinic to evaluate her glucometer it seems like I have given her several prescriptions for new glucometer's in the last few months on not sure what is going on.  I considered referring her to nutrition clinic but I'm I decided whether or not that would be beneficial for her. Her understanding of diabetes and appropriate diet is quite lacking, however her ability to take any information seems somewhat limited. I will discuss with our nutritionist and follow-up with this after she sees pharmacy clinic.  Today I gave her some information on diabetic diet, I spent greater than 50% of our 35 minute office visit counseling and education regarding appropriate use of protein at every meal, definition of protein versus carbohydrate versus fat. We discussed "sugar-free" cookies and other assorted foods.

## 2014-10-09 ENCOUNTER — Ambulatory Visit: Payer: Medicare Other | Admitting: Pharmacist

## 2014-10-14 ENCOUNTER — Ambulatory Visit: Payer: Medicare Other | Admitting: Family Medicine

## 2014-10-15 ENCOUNTER — Ambulatory Visit (INDEPENDENT_AMBULATORY_CARE_PROVIDER_SITE_OTHER): Payer: Medicare Other | Admitting: Pharmacist

## 2014-10-15 ENCOUNTER — Encounter: Payer: Self-pay | Admitting: Pharmacist

## 2014-10-15 VITALS — Ht 61.22 in | Wt 215.0 lb

## 2014-10-15 DIAGNOSIS — E1149 Type 2 diabetes mellitus with other diabetic neurological complication: Secondary | ICD-10-CM

## 2014-10-15 DIAGNOSIS — D510 Vitamin B12 deficiency anemia due to intrinsic factor deficiency: Secondary | ICD-10-CM | POA: Diagnosis not present

## 2014-10-15 MED ORDER — INSULIN ASPART 100 UNIT/ML FLEXPEN: 15.0000 [IU] | PEN_INJECTOR | Freq: Once | SUBCUTANEOUS | Status: DC

## 2014-10-15 MED ORDER — CYANOCOBALAMIN 1000 MCG/ML IJ SOLN
1000.0000 ug | Freq: Once | INTRAMUSCULAR | Status: AC
  Administered 2014-10-15: 1000 ug via INTRAMUSCULAR

## 2014-10-15 NOTE — Progress Notes (Signed)
S:    Patient arrives in good spirits today ambulating with walker present with granddaughter.    Presents for diabetes management. Patient reports having history of Diabetes since 1998.  Patient reports adherence with medications.  Current diabetes medications include: insulin glargine (Lantus Pen) 55 units BID.  Patient denies hypoglycemic events.  Patient reported dietary habits: meals on wheels for "breakfast". Sandwiches for lunch. Snacks on chips & sugar-free cookies.   O:  . Lab Results  Component Value Date   HGBA1C 8.8 07/15/2014    Home fasting CBG: in 300s  2 hour post-prandial/random CBG: high 350-400s  A/P: Diabetes diagnosed in 1998 currently uncontrolled with fasting and PPG CBGs averaging in 300s.  Goal is to get CBGs in 100s.  Plan is to initiate Novolog Flexpen 15 units once daily with largest meal (from Meals on Wheels at ~ 10:45 AM).  Sample was provided today.  Pt denies hypoglycemic events and is able to verbalize appropriate hypoglycemia management plan.   May need switch to Humalog with prescription at next visit due to insurance coverage. Next A1C anticipated in June 2016.  Written patient instructions provided.  Follow up in Pharmacist Clinic Visit in 3 weeks.   Total time in face to face counseling 25 minutes  Pt is VitB-12 deficient- 1,000 unit injection provided by nurse.  .  Patient seen with Devota Pacealeb Melancon MD Resident & Jannet Askewashmi Patel, PharmD Candidate.

## 2014-10-15 NOTE — Assessment & Plan Note (Signed)
Diabetes diagnosed in 1998 currently uncontrolled with fasting and PPG CBGs averaging in 300s.  Goal is to get CBGs in 100s.  Plan is to initiate Novolog Flexpen 15 units once daily with largest meal (from Meals on Wheels at ~ 10:45 AM).  Sample was provided today.  Pt denies hypoglycemic events and is able to verbalize appropriate hypoglycemia management plan.   May need switch to Humalog with prescription at next visit due to insurance coverage. Next A1C anticipated in June 2016.  Written patient instructions provided.  Follow up in Pharmacist Clinic Visit in 3 weeks.   Total time in face to face counseling 25 minutes

## 2014-10-15 NOTE — Progress Notes (Signed)
   Subjective:    Patient ID: [name] [name], female    DOB: 04/17/1934, [age] y.o.   MRN: [id number]  HPI Reviewed: Agree with Dr. Macky LowerKoval's documentation and management.    Review of Systems     Objective:   Physical Exam        Assessment & Plan:

## 2014-10-15 NOTE — Patient Instructions (Addendum)
It was great to see you today!  Continue to take Lantus 55 units TWICE DAILY. Start taking insulin aspart (Novolog) 15 units once daily before largest meal.  Remember to rotate injection sites. When injecting, hold for 10 seconds before removing needle.   Follow up in Pharmacy Clinic with Dr. Raymondo BandKoval in 3 weeks.

## 2014-10-16 ENCOUNTER — Other Ambulatory Visit: Payer: Self-pay | Admitting: Family Medicine

## 2014-10-16 DIAGNOSIS — Z96652 Presence of left artificial knee joint: Secondary | ICD-10-CM

## 2014-10-16 DIAGNOSIS — M7500 Adhesive capsulitis of unspecified shoulder: Secondary | ICD-10-CM

## 2014-10-16 DIAGNOSIS — M1710 Unilateral primary osteoarthritis, unspecified knee: Secondary | ICD-10-CM

## 2014-10-16 DIAGNOSIS — IMO0002 Reserved for concepts with insufficient information to code with codable children: Secondary | ICD-10-CM

## 2014-10-16 DIAGNOSIS — M48061 Spinal stenosis, lumbar region without neurogenic claudication: Secondary | ICD-10-CM

## 2014-10-16 DIAGNOSIS — R269 Unspecified abnormalities of gait and mobility: Secondary | ICD-10-CM

## 2014-10-16 DIAGNOSIS — E11618 Type 2 diabetes mellitus with other diabetic arthropathy: Secondary | ICD-10-CM

## 2014-10-16 DIAGNOSIS — Z6841 Body Mass Index (BMI) 40.0 and over, adult: Secondary | ICD-10-CM

## 2014-10-16 NOTE — Progress Notes (Signed)
I received new / more paperwork for wheelchair /     Mobility device. This set is from [employer]. I also received a call from [facility] today asking about paperwork. I performed evaluation back in November of 2015 and [facility].  I am not sure why I am getting duplicate requests.  I no longer perform evaluations for electric wheelchairs / mobility devices. I will have my nurse call her and set her up for a formal evaluation with the Occupational /  Physical Therapy in [facility] who DOES do these evaluations. I am sending the paperwork back to [employer] stating such. Denny LevySara Nobuo Nunziata

## 2014-10-16 NOTE — Progress Notes (Signed)
Pt informed that she should here from [facility] for a formal wheelchair evaluation.  Patient should here from [facility] in a week; if she has not  Heard anything from them to [facility].  Pt stated understanding.  Clovis PuMartin, [name] L, RN

## 2014-10-16 NOTE — Progress Notes (Signed)
Patient ID: [name], female   DOB: 12/03/1933, 81 y.o.   MRN: [id number] [name] Can u call her and let her know I have put in a referral for her to go to [facility] for a FORMAL wheelchair evaluation.  See my note below to help you understand. She should hear from them in next 1 week (the [facility] people--if she does not then she needs to let us know. ALL future wheelchair paperwork will be filled out at that eva;uation.

## 2014-10-21 DIAGNOSIS — R634 Abnormal weight loss: Secondary | ICD-10-CM | POA: Diagnosis not present

## 2014-10-21 DIAGNOSIS — R32 Unspecified urinary incontinence: Secondary | ICD-10-CM | POA: Diagnosis not present

## 2014-10-22 ENCOUNTER — Other Ambulatory Visit: Payer: Self-pay | Admitting: Family Medicine

## 2014-10-22 DIAGNOSIS — Z6841 Body Mass Index (BMI) 40.0 and over, adult: Principal | ICD-10-CM

## 2014-10-22 DIAGNOSIS — M48061 Spinal stenosis, lumbar region without neurogenic claudication: Secondary | ICD-10-CM

## 2014-10-27 ENCOUNTER — Telehealth: Payer: Self-pay | Admitting: Family Medicine

## 2014-10-27 NOTE — Telephone Encounter (Signed)
Pt said she talked to Wheel chair professionals today and wanted to know why dr neal denied the request Message was read to pt She hasnt received any call about a referral to the Neuro dept about an evaulation  Please advise Said dr Jennette Kettleneal hasnt signed off on her Ensure

## 2014-10-28 NOTE — Telephone Encounter (Signed)
Shanda BumpsJessica This is the referral we had to re- do--to neuro rehab PT instead of OT--we HAVE done the referral  Can u call her and explain to her t hat 1) I did  NOT DENY her request. She needs an evaluation by the physical therapy people before she orders any more wheel chairs or parts--I sent that referral in--- 2) see if she knows anything about her appointment for her PT evaluation 3) if not, can u [facility]! Denny LevySara Neal

## 2014-10-29 ENCOUNTER — Other Ambulatory Visit: Payer: Self-pay | Admitting: *Deleted

## 2014-10-29 DIAGNOSIS — Z6841 Body Mass Index (BMI) 40.0 and over, adult: Secondary | ICD-10-CM

## 2014-10-29 DIAGNOSIS — M4802 Spinal stenosis, cervical region: Secondary | ICD-10-CM

## 2014-10-29 DIAGNOSIS — IMO0002 Reserved for concepts with insufficient information to code with codable children: Secondary | ICD-10-CM

## 2014-10-29 DIAGNOSIS — Z9181 History of falling: Secondary | ICD-10-CM

## 2014-10-29 DIAGNOSIS — M48061 Spinal stenosis, lumbar region without neurogenic claudication: Secondary | ICD-10-CM

## 2014-10-29 DIAGNOSIS — M1710 Unilateral primary osteoarthritis, unspecified knee: Secondary | ICD-10-CM

## 2014-10-29 NOTE — Progress Notes (Signed)
Opened in error. [name]  

## 2014-10-29 NOTE — Telephone Encounter (Signed)
Spoke with [facility], they have received the referral and they should be contacting pt within the next two weeks (they are little behind).  Pt informed and agreeable [name] [name], [name] [name], CMA

## 2014-10-29 NOTE — Addendum Note (Signed)
Addended by: Jone BasemanFLEEGER, Emonte Dieujuste D on: 10/29/2014 08:55 AM   Modules accepted: Orders

## 2014-11-05 ENCOUNTER — Other Ambulatory Visit: Payer: Self-pay | Admitting: Family Medicine

## 2014-11-05 ENCOUNTER — Ambulatory Visit (INDEPENDENT_AMBULATORY_CARE_PROVIDER_SITE_OTHER): Payer: Commercial Managed Care - HMO | Admitting: Pharmacist

## 2014-11-05 ENCOUNTER — Encounter: Payer: Self-pay | Admitting: Pharmacist

## 2014-11-05 VITALS — BP 135/61 | HR 72 | Ht 60.0 in | Wt 217.3 lb

## 2014-11-05 DIAGNOSIS — E1149 Type 2 diabetes mellitus with other diabetic neurological complication: Secondary | ICD-10-CM

## 2014-11-05 MED ORDER — INSULIN LISPRO 100 UNIT/ML (KWIKPEN): 20.0000 [IU] | PEN_INJECTOR | Freq: Two times a day (BID) | SUBCUTANEOUS | Status: DC

## 2014-11-05 NOTE — Progress Notes (Signed)
S:    Patient arrives in good spirits accompanied by her granddaughter. Presents for diabetes follow up stating that her blood sugars are improved since her last last visit.  Granddaughter states the "Novolog works better than the Lantus".  Patient reports adherence with medications. Current diabetes medications include Lantus 55 units twice daily AND Novolog 15 units once daily prior to "meals-on-wheels" early meal of day.   Patient denies hypoglycemic events.  Patient reported dietary habits: are unchanged - 3 meals per day   Patient reported exercise habits: unchanged.   Patient denies nocturia with only 1 time per night urination.    O:  Lab Results  Component Value Date   HGBA1C 8.8 07/15/2014     Home fasting CBG: 150-400 with more readings 200-250 than previous visit.   A/P: Diabetes remains with suboptimal control however currently improved since last visit with use of meal time insulin.   denies hypoglycemic events and is able to verbalize appropriate hypoglycemia management plan.  reports adherence with medication. Control is suboptimal due to suboptimal insulin regimen.  Continued basal insulin Lantus (insulin glargine) at current dose of 55units twice daily.  Changed mealtime insulin to Humalog 20 units prior to the two large meals of the day. Moved patient dosing time of both insulins to prior to two large meal per day.   Reevaluate blood glucose at next visit with Dr. Jennette KettleNeal.  Next A1C anticipated in 2 months.  Written patient instructions provided.  Follow up in Pharmacist Clinic Visit after next visit with Dr. Jennette KettleNeal - timing of that visit to be determined by control in two weeks.   Prefer to see patient next month.  Total time in face to face counseling 25 minutes.  Patient seen with Gus HeightPhillip Transou, PharmD Candidate.

## 2014-11-05 NOTE — Patient Instructions (Addendum)
Thanks for coming back to see [name] [name]!  Please fill the Humalog Kwikpen and inject 20 units before breakfast and again before your evening meal.  Inject your Lantus 55 units at the same time as the Humalog (before meals). Continue to take your other medications as prescribed.  Please continue to check your blood sugars every morning & prior to dinner or at bedtime. When you come back to see Dr. Jennette KettleNeal on 11/18/14, remember to bring your blood sugar monitor.

## 2014-11-05 NOTE — Assessment & Plan Note (Signed)
Diabetes remains with suboptimal control however currently improved since last visit with use of meal time insulin.   denies hypoglycemic events and is able to verbalize appropriate hypoglycemia management plan.  reports adherence with medication. Control is suboptimal due to suboptimal insulin regimen.  Continued basal insulin Lantus (insulin glargine) at current dose of 55units twice daily.  Changed mealtime insulin to Humalog 20 units prior to the two large meals of the day. Moved patient dosing time of both insulins to prior to two large meal per day.   Reevaluate blood glucose at next visit with Dr. Jennette KettleNeal.  Next A1C anticipated in 2 months.  Written patient instructions provided.  Follow up in Pharmacist Clinic Visit after next visit with Dr. Jennette KettleNeal - timing of that visit to be determined by control in two weeks.   Prefer to see patient next month.  Total time in face to face counseling 25 minutes.  Patient seen with Gus HeightPhillip Transou, PharmD Candidate.

## 2014-11-05 NOTE — Progress Notes (Signed)
Patient ID: [name] [name], female   DOB: 08/27/1933, 81 y.o.   MRN: [id number] Reviewed: agree with Dr. Macky LowerKoval's documentation and management.

## 2014-11-09 ENCOUNTER — Telehealth: Payer: Self-pay | Admitting: Family Medicine

## 2014-11-09 NOTE — Telephone Encounter (Signed)
Pt called because she needs the doctor to sign off on her wheel chair papers and for her insure supplement drink. jw

## 2014-11-10 NOTE — Telephone Encounter (Signed)
Dear [name] [name] Team As I told her in the last phone note, for her to get any new pain for signed for a wheelchair, she's going to have to have an evaluation done by neuro rehabilitation. I'm pre-sure we sent the referral in for that, can you please check on that. Regarding her in sure, I don't have any paperwork for that but if I get some in by fax I'll be happy to sign it. THANKS! [name] [name]

## 2014-11-13 NOTE — Telephone Encounter (Signed)
The referral is being worked on.  She has an appt with you on 11-19-14 and can update patient on appt then. Dae Highley,CMA

## 2014-11-18 ENCOUNTER — Ambulatory Visit: Payer: Medicare Other | Admitting: Family Medicine

## 2014-11-25 ENCOUNTER — Encounter: Payer: Self-pay | Admitting: Family Medicine

## 2014-11-25 ENCOUNTER — Ambulatory Visit (INDEPENDENT_AMBULATORY_CARE_PROVIDER_SITE_OTHER): Payer: Medicare Other | Admitting: Family Medicine

## 2014-11-25 VITALS — BP 146/64 | HR 73 | Temp 98.4°F | Ht 60.0 in | Wt 219.0 lb

## 2014-11-25 DIAGNOSIS — E1149 Type 2 diabetes mellitus with other diabetic neurological complication: Secondary | ICD-10-CM

## 2014-11-25 LAB — POCT GLYCOSYLATED HEMOGLOBIN (HGB A1C): HEMOGLOBIN A1C: 9.6

## 2014-11-25 MED ORDER — BECLOMETHASONE DIPROPIONATE 40 MCG/ACT IN AERS: 1.0000 | INHALATION_SPRAY | Freq: Two times a day (BID) | RESPIRATORY_TRACT | Status: AC

## 2014-11-26 NOTE — Assessment & Plan Note (Signed)
Improved control. Will not change any dosing today. A1C was increased fromprior but that was several months ago so will see if we are making headway in 3 m. She also has f/u pharmacy clinic.

## 2014-11-26 NOTE — Progress Notes (Signed)
   Subjective:    Patient ID: [name] [name], female    DOB: 09/16/1933, 81 y.o.   MRN: [id number]  HPI #1. Follow-up diabetes mellitus. She brings her glucometer with her for review. She has a lot more readings in the 150-250 range then previously when she had many more readings in the 3-400 range. She was seen at pharmacy clinic and they changed her insulin regimen around a little bit she feels like it's working better. She also admits she's not eating as much ice cream and she had been. #2. She still waiting on the appointment to get her wheelchair evaluation    Review of Systems See HPI Pertinent review of systems: negative for fever or unusual weight change.     Objective:   Physical Exam Vital signs reviewed. GENERAL: Well-developed, well-nourished, no acute distress. CARDIOVASCULAR: Regular rate and rhythm no murmur gallop or rub LUNGS: Clear to auscultation bilaterally, no rales or wheeze. ABDOMEN: Soft positive bowel sounds NEURO: No gross focal neurological deficits. MSK: Movement of extremity x 4.         Assessment & Plan:

## 2014-11-27 ENCOUNTER — Telehealth: Payer: Self-pay | Admitting: *Deleted

## 2014-11-27 ENCOUNTER — Other Ambulatory Visit: Payer: Self-pay | Admitting: Family Medicine

## 2014-11-27 DIAGNOSIS — R32 Unspecified urinary incontinence: Secondary | ICD-10-CM | POA: Diagnosis not present

## 2014-11-27 DIAGNOSIS — E1340 Other specified diabetes mellitus with diabetic neuropathy, unspecified: Secondary | ICD-10-CM | POA: Diagnosis not present

## 2014-11-27 NOTE — Progress Notes (Signed)
I have received, filled out, siigned and faxed ensure refill back  [name] LevySara Gracin Mcpartland

## 2014-11-27 NOTE — Progress Notes (Signed)
Contacted Char at [employer] at [phone], she said they have been faxing over the authorization i told her that I didn't think that Dr. Jennette KettleNeal had received it, and I told her i would look into that but in the mean time she is faxing another form and said if we dont have it by Monday afternoon that we should call back on Tuesday.  Joycelyn Man.Zimmerman Rumple, April D, CMA

## 2014-11-30 DIAGNOSIS — R32 Unspecified urinary incontinence: Secondary | ICD-10-CM | POA: Diagnosis not present

## 2014-11-30 DIAGNOSIS — R634 Abnormal weight loss: Secondary | ICD-10-CM | POA: Diagnosis not present

## 2014-12-01 DIAGNOSIS — R32 Unspecified urinary incontinence: Secondary | ICD-10-CM | POA: Diagnosis not present

## 2014-12-01 DIAGNOSIS — R634 Abnormal weight loss: Secondary | ICD-10-CM | POA: Diagnosis not present

## 2014-12-01 NOTE — Telephone Encounter (Signed)
Confirmed with Dr. Jennette KettleNeal that she had received the fax in referencing her Ensure and she stated that she had faxed it. Lamonte SakaiZimmerman Rumple, April D, [place]

## 2014-12-08 ENCOUNTER — Other Ambulatory Visit: Payer: Self-pay | Admitting: Family Medicine

## 2014-12-11 ENCOUNTER — Other Ambulatory Visit: Payer: Self-pay | Admitting: Family Medicine

## 2014-12-11 NOTE — Telephone Encounter (Signed)
Pt called because she needs refills on her Kwik pen needles and amitriptyline. She is going out of town tonight so needs this as soon as we can. jw

## 2014-12-15 ENCOUNTER — Other Ambulatory Visit: Payer: Self-pay | Admitting: *Deleted

## 2014-12-15 MED ORDER — INSULIN PEN NEEDLE 31G X 5 MM MISC: Status: AC

## 2014-12-17 ENCOUNTER — Ambulatory Visit: Payer: Medicare Other | Admitting: Pharmacist

## 2014-12-22 DIAGNOSIS — J9601 Acute respiratory failure with hypoxia: Secondary | ICD-10-CM | POA: Diagnosis not present

## 2014-12-22 DIAGNOSIS — R32 Unspecified urinary incontinence: Secondary | ICD-10-CM | POA: Diagnosis not present

## 2015-01-06 ENCOUNTER — Ambulatory Visit: Payer: Medicare Other | Admitting: Physical Therapy

## 2015-01-07 ENCOUNTER — Ambulatory Visit: Payer: Medicare Other | Admitting: Physical Therapy

## 2015-01-10 ENCOUNTER — Other Ambulatory Visit: Payer: Self-pay | Admitting: Family Medicine

## 2015-01-14 DIAGNOSIS — R32 Unspecified urinary incontinence: Secondary | ICD-10-CM | POA: Diagnosis not present

## 2015-01-14 DIAGNOSIS — R634 Abnormal weight loss: Secondary | ICD-10-CM | POA: Diagnosis not present

## 2015-01-20 ENCOUNTER — Other Ambulatory Visit: Payer: Self-pay | Admitting: Family Medicine

## 2015-01-20 NOTE — Telephone Encounter (Signed)
Refill completed.  Martin, [name] L, RN  

## 2015-01-21 DIAGNOSIS — J9601 Acute respiratory failure with hypoxia: Secondary | ICD-10-CM | POA: Diagnosis not present

## 2015-01-21 DIAGNOSIS — R32 Unspecified urinary incontinence: Secondary | ICD-10-CM | POA: Diagnosis not present

## 2015-02-03 DIAGNOSIS — R32 Unspecified urinary incontinence: Secondary | ICD-10-CM | POA: Diagnosis not present

## 2015-02-03 DIAGNOSIS — E1340 Other specified diabetes mellitus with diabetic neuropathy, unspecified: Secondary | ICD-10-CM | POA: Diagnosis not present

## 2015-02-16 ENCOUNTER — Telehealth: Payer: Self-pay | Admitting: Family Medicine

## 2015-02-16 NOTE — Telephone Encounter (Signed)
Jessica Please call Pattricia Boss at [facility] and explain to her that I am NOT doing her wheelchair eval abnd let patient know as well. We have ben over this with her before and I set her up with PT to getthe wheelchair eval THANKS! [name]

## 2015-02-16 NOTE — Telephone Encounter (Signed)
Upon reviewing her chart I see that she canceled 2 appointments @ [facility].  Attempted to call [facility] but was on hold for .  Called pt and explained that we do not do these evaluations and that she canceled her 2 previous appointments with the place that could.  She would still like to be seen tomorrow for her diabetes.  Contacted [facility] so I could have an appointment to hand patient tomorrow, the scheduler has left for the day.  I will call her back in the AM (Angie @ [phone]). Analiese Krupka, Maryjo Rochester

## 2015-02-16 NOTE — Telephone Encounter (Signed)
Anne from [facility] is calling and wants to ensure that the paperwork has been received for this pt's wheelchair eval tomorrow, if not, one may contact the company at [phone]. To have the paperwork re-faxed. Sadie Reynolds, ASA

## 2015-02-17 ENCOUNTER — Ambulatory Visit: Payer: Medicare Other | Admitting: Family Medicine

## 2015-02-18 NOTE — Telephone Encounter (Signed)
Pt rescheduled appt for 03/10/15.  Will wait till appt to call neuro[facility]hab center. [name] [name], Maryjo [place]

## 2015-02-21 DIAGNOSIS — J9601 Acute respiratory failure with hypoxia: Secondary | ICD-10-CM | POA: Diagnosis not present

## 2015-02-21 DIAGNOSIS — R32 Unspecified urinary incontinence: Secondary | ICD-10-CM | POA: Diagnosis not present

## 2015-02-28 ENCOUNTER — Other Ambulatory Visit: Payer: Self-pay | Admitting: Family Medicine

## 2015-03-01 NOTE — Telephone Encounter (Signed)
Refill completed.  Meng Winterton L, RN  

## 2015-03-02 ENCOUNTER — Telehealth: Payer: Self-pay | Admitting: *Deleted

## 2015-03-02 NOTE — Telephone Encounter (Signed)
Prior Authorization received from [employer] for Qvar.  PA form placed in provider box for completion. Clovis Pu, RN

## 2015-03-03 ENCOUNTER — Encounter: Payer: Self-pay | Admitting: Family Medicine

## 2015-03-03 DIAGNOSIS — J449 Chronic obstructive pulmonary disease, unspecified: Secondary | ICD-10-CM | POA: Insufficient documentation

## 2015-03-03 NOTE — Telephone Encounter (Signed)
PA form faxed to [employer] for review.  Review process could take 24-72 hours to complete.  Martin, [name] L, RN  

## 2015-03-04 ENCOUNTER — Other Ambulatory Visit: Payer: Self-pay | Admitting: Family Medicine

## 2015-03-04 NOTE — Telephone Encounter (Signed)
PA for Ovar has been approved via [employer] until 06/26/15.  [employer] is aware of approval.  Reference number: [id number]. Clovis Pu, RN

## 2015-03-05 ENCOUNTER — Other Ambulatory Visit: Payer: Self-pay | Admitting: Family Medicine

## 2015-03-09 DIAGNOSIS — E1340 Other specified diabetes mellitus with diabetic neuropathy, unspecified: Secondary | ICD-10-CM | POA: Diagnosis not present

## 2015-03-09 DIAGNOSIS — R32 Unspecified urinary incontinence: Secondary | ICD-10-CM | POA: Diagnosis not present

## 2015-03-10 ENCOUNTER — Ambulatory Visit (INDEPENDENT_AMBULATORY_CARE_PROVIDER_SITE_OTHER): Payer: Medicare Other | Admitting: Family Medicine

## 2015-03-10 ENCOUNTER — Encounter: Payer: Self-pay | Admitting: Family Medicine

## 2015-03-10 VITALS — BP 150/52 | HR 70 | Temp 98.3°F | Ht 60.0 in | Wt 223.7 lb

## 2015-03-10 DIAGNOSIS — M179 Osteoarthritis of knee, unspecified: Secondary | ICD-10-CM

## 2015-03-10 DIAGNOSIS — R269 Unspecified abnormalities of gait and mobility: Secondary | ICD-10-CM

## 2015-03-10 DIAGNOSIS — I10 Essential (primary) hypertension: Secondary | ICD-10-CM

## 2015-03-10 DIAGNOSIS — Z23 Encounter for immunization: Secondary | ICD-10-CM | POA: Diagnosis not present

## 2015-03-10 DIAGNOSIS — E1149 Type 2 diabetes mellitus with other diabetic neurological complication: Secondary | ICD-10-CM | POA: Diagnosis not present

## 2015-03-10 DIAGNOSIS — E538 Deficiency of other specified B group vitamins: Secondary | ICD-10-CM

## 2015-03-10 DIAGNOSIS — M1710 Unilateral primary osteoarthritis, unspecified knee: Secondary | ICD-10-CM

## 2015-03-10 DIAGNOSIS — N183 Chronic kidney disease, stage 3 unspecified: Secondary | ICD-10-CM

## 2015-03-10 DIAGNOSIS — IMO0002 Reserved for concepts with insufficient information to code with codable children: Secondary | ICD-10-CM

## 2015-03-10 DIAGNOSIS — Z96652 Presence of left artificial knee joint: Secondary | ICD-10-CM

## 2015-03-10 LAB — COMPREHENSIVE METABOLIC PANEL
ALT: 18 U/L (ref 6–29)
AST: 25 U/L (ref 10–35)
Albumin: 3.5 g/dL — ABNORMAL LOW (ref 3.6–5.1)
Alkaline Phosphatase: 94 U/L (ref 33–130)
BILIRUBIN TOTAL: 0.4 mg/dL (ref 0.2–1.2)
BUN: 19 mg/dL (ref 7–25)
CHLORIDE: 97 mmol/L — AB (ref 98–110)
CO2: 32 mmol/L — ABNORMAL HIGH (ref 20–31)
CREATININE: 1.49 mg/dL — AB (ref 0.60–0.93)
Calcium: 9 mg/dL (ref 8.6–10.4)
GLUCOSE: 134 mg/dL — AB (ref 65–99)
Potassium: 4.6 mmol/L (ref 3.5–5.3)
SODIUM: 138 mmol/L (ref 135–146)
Total Protein: 6.1 g/dL (ref 6.1–8.1)

## 2015-03-10 LAB — POCT GLYCOSYLATED HEMOGLOBIN (HGB A1C): Hemoglobin A1C: 8.8

## 2015-03-10 MED ORDER — ALBUTEROL SULFATE HFA 108 (90 BASE) MCG/ACT IN AERS: 2.0000 | INHALATION_SPRAY | RESPIRATORY_TRACT | Status: AC | PRN

## 2015-03-10 MED ORDER — CYANOCOBALAMIN 1000 MCG/ML IJ SOLN
1000.0000 ug | Freq: Once | INTRAMUSCULAR | Status: AC
  Administered 2015-03-10: 1000 ug via INTRAMUSCULAR

## 2015-03-10 NOTE — Patient Instructions (Addendum)
I will send you a note about the blood work we drew today. We also gave you your flu shot. I would like to see you back in 3 months. I called in your refill on the albuterol inhaler. At next office visit, please bring all your medicine so we can make sure we are up-to-date. We have rescheduled your appointment for tehewheelchair evaluation--it is on April 29, 2015. Please do NOT miss this as they will not re-schedule it again.

## 2015-03-12 NOTE — Assessment & Plan Note (Signed)
Her A1c is actually improved. I think if she would just give little bit tighter control her diet she be in much better shape we discussed this.

## 2015-03-12 NOTE — Progress Notes (Signed)
   Subjective:    Patient ID: [name] [name], female    DOB: May 12, 1934, 81 y.o.   MRN: [id number]  HPI Follow-up diabetes mellitus. She is trying to less ice cream but still not following a diabetic diet entirely. She did have 2 cookies for breakfast. Tolerating her medicines well.   Review of Systems No episodes of low blood sugar. No unusual weight change. No fever, sweats, chills.    Objective:   Physical Exam  Vital signs reviewed. GENERAL: Well-developed, well-nourished, no acute distress. CARDIOVASCULAR: Regular rate and rhythm no murmur gallop or rub LUNGS: Clear to auscultation bilaterally, no rales or wheeze. ABDOMEN: Soft positive bowel sounds MSK: Movement of extremity x 4. Skin with a rolling walker.        Assessment & Plan:

## 2015-03-19 ENCOUNTER — Encounter: Payer: Self-pay | Admitting: Family Medicine

## 2015-03-24 DIAGNOSIS — J9601 Acute respiratory failure with hypoxia: Secondary | ICD-10-CM | POA: Diagnosis not present

## 2015-03-24 DIAGNOSIS — R32 Unspecified urinary incontinence: Secondary | ICD-10-CM | POA: Diagnosis not present

## 2015-03-26 ENCOUNTER — Other Ambulatory Visit: Payer: Self-pay | Admitting: Family Medicine

## 2015-03-27 MED ORDER — AMITRIPTYLINE HCL 25 MG PO TABS
25.0000 mg | ORAL_TABLET | Freq: Every evening | ORAL | Status: DC | PRN
Start: 2015-03-27 — End: 2015-09-15

## 2015-03-27 NOTE — Telephone Encounter (Signed)
Pt called for refills of Elavil, Lantus and Ropinarole Meds refilled  Alyssa A. Kennon Rounds MD, MS Family Medicine Resident PGY-2 Pager [phone]

## 2015-04-07 DIAGNOSIS — R634 Abnormal weight loss: Secondary | ICD-10-CM | POA: Diagnosis not present

## 2015-04-07 DIAGNOSIS — R32 Unspecified urinary incontinence: Secondary | ICD-10-CM | POA: Diagnosis not present

## 2015-04-08 DIAGNOSIS — R32 Unspecified urinary incontinence: Secondary | ICD-10-CM | POA: Diagnosis not present

## 2015-04-08 DIAGNOSIS — E1340 Other specified diabetes mellitus with diabetic neuropathy, unspecified: Secondary | ICD-10-CM | POA: Diagnosis not present

## 2015-04-14 ENCOUNTER — Telehealth: Payer: Self-pay | Admitting: Family Medicine

## 2015-04-14 NOTE — Telephone Encounter (Signed)
Malika called and was checking the status of the forms that they faxed on 03/12/15 to us for the patient to get pulls ups. Please call Malika at [phone] and [facility]. Management. jw

## 2015-04-16 NOTE — Telephone Encounter (Signed)
I have already faxed this back. I no longer have the form.If they do not have it, then itt will have to be faxed to me again [facility]! Denny LevySara Neal

## 2015-04-17 ENCOUNTER — Other Ambulatory Visit: Payer: Self-pay | Admitting: Family Medicine

## 2015-04-18 ENCOUNTER — Other Ambulatory Visit: Payer: Self-pay | Admitting: Family Medicine

## 2015-04-19 NOTE — Telephone Encounter (Signed)
Contacted Malika and she stated that she resent forms on 04/14/2015 and that Annice PihJackie stated that [name] had the forms and that they were just waiting for the PCP to sign them.  Forwarding to PCP so they can be on the lookout for these. Lamonte SakaiZimmerman Rumple, April D, [place]

## 2015-04-20 NOTE — Telephone Encounter (Signed)
Done [name] [name]  

## 2015-04-21 NOTE — Telephone Encounter (Signed)
Refaxed 04/21/2015. Lamonte SakaiZimmerman Rumple, April D, [place]

## 2015-04-23 DIAGNOSIS — R32 Unspecified urinary incontinence: Secondary | ICD-10-CM | POA: Diagnosis not present

## 2015-04-23 DIAGNOSIS — J9601 Acute respiratory failure with hypoxia: Secondary | ICD-10-CM | POA: Diagnosis not present

## 2015-04-29 ENCOUNTER — Ambulatory Visit: Payer: Medicare Other | Admitting: Physical Therapy

## 2015-04-29 ENCOUNTER — Ambulatory Visit: Payer: Medicare Other | Attending: Family Medicine | Admitting: Physical Therapy

## 2015-05-07 ENCOUNTER — Telehealth: Payer: Self-pay | Admitting: Family Medicine

## 2015-05-07 NOTE — Telephone Encounter (Signed)
Pt called to see if the doctor had faxed the forms back to the company that is supplying her Ensure and pull ups. jw

## 2015-05-10 NOTE — Telephone Encounter (Signed)
Yes they have been faxed [facility]! Denny LevySara Neal

## 2015-05-10 NOTE — Telephone Encounter (Signed)
Pt informed of below. Zimmerman Rumple, Muhammad Vacca D, CMA  

## 2015-05-14 DIAGNOSIS — R634 Abnormal weight loss: Secondary | ICD-10-CM | POA: Diagnosis not present

## 2015-05-14 DIAGNOSIS — R32 Unspecified urinary incontinence: Secondary | ICD-10-CM | POA: Diagnosis not present

## 2015-05-24 DIAGNOSIS — J9601 Acute respiratory failure with hypoxia: Secondary | ICD-10-CM | POA: Diagnosis not present

## 2015-05-24 DIAGNOSIS — R32 Unspecified urinary incontinence: Secondary | ICD-10-CM | POA: Diagnosis not present

## 2015-05-25 DIAGNOSIS — R32 Unspecified urinary incontinence: Secondary | ICD-10-CM | POA: Diagnosis not present

## 2015-05-25 DIAGNOSIS — E1340 Other specified diabetes mellitus with diabetic neuropathy, unspecified: Secondary | ICD-10-CM | POA: Diagnosis not present

## 2015-06-03 ENCOUNTER — Other Ambulatory Visit: Payer: Self-pay | Admitting: Family Medicine

## 2015-06-16 ENCOUNTER — Other Ambulatory Visit: Payer: Self-pay | Admitting: Family Medicine

## 2015-06-17 DIAGNOSIS — R32 Unspecified urinary incontinence: Secondary | ICD-10-CM | POA: Diagnosis not present

## 2015-06-17 DIAGNOSIS — R634 Abnormal weight loss: Secondary | ICD-10-CM | POA: Diagnosis not present

## 2015-06-23 DIAGNOSIS — R32 Unspecified urinary incontinence: Secondary | ICD-10-CM | POA: Diagnosis not present

## 2015-06-23 DIAGNOSIS — J9601 Acute respiratory failure with hypoxia: Secondary | ICD-10-CM | POA: Diagnosis not present

## 2015-07-01 ENCOUNTER — Ambulatory Visit: Payer: Medicare Other | Admitting: Physical Therapy

## 2015-07-02 ENCOUNTER — Telehealth: Payer: Self-pay | Admitting: Family Medicine

## 2015-07-02 NOTE — Telephone Encounter (Signed)
Pt called and needs some pulls up ordered for her. She uses about 10 a day. jw

## 2015-07-08 ENCOUNTER — Telehealth: Payer: Self-pay | Admitting: Family Medicine

## 2015-07-08 NOTE — Telephone Encounter (Signed)
Will forward to MD to fax a script for pullups Jazmin Hartsell,CMA

## 2015-07-08 NOTE — Telephone Encounter (Signed)
Fax # for dr neal  [phone] for the pull ups that she needs.  She doesn't know what dr Jennette Kettleneal is suppose to fax in? She is running out of them so please do this quickly

## 2015-07-09 ENCOUNTER — Encounter: Payer: Self-pay | Admitting: Family Medicine

## 2015-07-13 NOTE — Telephone Encounter (Signed)
I have sent the Rx and office notes as requested. See letter in chart addressing this [name] [name]

## 2015-07-21 ENCOUNTER — Other Ambulatory Visit: Payer: Self-pay | Admitting: Family Medicine

## 2015-07-22 ENCOUNTER — Other Ambulatory Visit: Payer: Self-pay | Admitting: Family Medicine

## 2015-07-24 DIAGNOSIS — R32 Unspecified urinary incontinence: Secondary | ICD-10-CM | POA: Diagnosis not present

## 2015-07-24 DIAGNOSIS — J9601 Acute respiratory failure with hypoxia: Secondary | ICD-10-CM | POA: Diagnosis not present

## 2015-07-24 DIAGNOSIS — I509 Heart failure, unspecified: Secondary | ICD-10-CM | POA: Diagnosis not present

## 2015-07-30 ENCOUNTER — Other Ambulatory Visit: Payer: Self-pay | Admitting: Family Medicine

## 2015-07-30 NOTE — Telephone Encounter (Signed)
Needs refill on pull ups and ensure

## 2015-08-02 NOTE — Telephone Encounter (Signed)
Dear Cliffton Asters Team Can u see what the issue is---see telephone note below which I have copied from last month--I have ordered these multiple times--I sent a letter (in the chart) I am not sure what he problem is.  I shouldn' have to order these more than once a year.The last phone notes are copied here:   Nestor Ramp, MD at 07/13/2015 2:26 PM     Status: Signed       Expand All Collapse All   I have sent the Rx and office notes as requested. See letter in chart addressing this [name] [name], CMA at 07/08/2015 12:38 PM     Status: Signed       Expand All Collapse All   Will forward to MD to fax a script for pullups Jazmin Hartsell,CMA             Avanell Shackleton at 07/08/2015 12:13 PM     Status: Signed       Expand All Collapse All   Fax # for dr Parul Porcelli [phone] for the pull ups that she needs. She doesn't know what dr [name] is suppose to fax in? She is running out of them so please do this quickly

## 2015-08-02 NOTE — Telephone Encounter (Signed)
See note from 04/21/2015. Called spoke with Tammy. She said we would have to start all over with the paperwork. She will double check this and fax over the paperwork in the next two days. Sunday Spillers, CMA

## 2015-08-06 NOTE — Telephone Encounter (Signed)
Pt stated she had not received her pullups or Ensure.  Call was made to [phone]  [employer] about this.  Because the order was linked (pullups and Ensure), pt was unable to get the pullups.  Company had faxed info about both of them. The order was split----meaning the pullups were to be shipped on Monday-hopefully received by pt on Tues or Wed.  Dr [name] will have to authorize the Ensure.

## 2015-08-09 DIAGNOSIS — R32 Unspecified urinary incontinence: Secondary | ICD-10-CM | POA: Diagnosis not present

## 2015-08-09 DIAGNOSIS — R634 Abnormal weight loss: Secondary | ICD-10-CM | POA: Diagnosis not present

## 2015-08-11 ENCOUNTER — Ambulatory Visit: Payer: Self-pay | Admitting: Family Medicine

## 2015-08-18 ENCOUNTER — Ambulatory Visit: Payer: Self-pay | Admitting: Family Medicine

## 2015-08-18 DIAGNOSIS — R634 Abnormal weight loss: Secondary | ICD-10-CM | POA: Diagnosis not present

## 2015-08-20 ENCOUNTER — Telehealth: Payer: Self-pay | Admitting: Family Medicine

## 2015-08-20 NOTE — Telephone Encounter (Signed)
Pt needs a prescription for more pull ups. jw

## 2015-08-21 ENCOUNTER — Other Ambulatory Visit: Payer: Self-pay | Admitting: Student

## 2015-08-23 ENCOUNTER — Other Ambulatory Visit: Payer: Self-pay | Admitting: Family Medicine

## 2015-08-23 NOTE — Telephone Encounter (Signed)
Spoke with Fleet Contras at [employer] [phone]) and she was able to pull a previous script for patient that is still good for 1 year.  Patient will be shipped an additional 80 pulls up and and protective underwear for this month.  Patient already has received a shipment this month.  Fleet Contras informed me that Dr. [name] had signed a DWO in October 2016 and this would be good through Oct 2017.  Patient was initially requesting 300 pull ups but this amount is not allowed by her insurance.  Fleet Contras was unable to see the limit allowed by insurance.  Patient's order/confirmation number is [id number]. Jazmin Hartsell,CMA

## 2015-08-24 DIAGNOSIS — R32 Unspecified urinary incontinence: Secondary | ICD-10-CM | POA: Diagnosis not present

## 2015-08-24 DIAGNOSIS — J9601 Acute respiratory failure with hypoxia: Secondary | ICD-10-CM | POA: Diagnosis not present

## 2015-08-25 ENCOUNTER — Ambulatory Visit: Payer: Self-pay | Admitting: Family Medicine

## 2015-09-01 ENCOUNTER — Telehealth: Payer: Self-pay | Admitting: Family Medicine

## 2015-09-01 DIAGNOSIS — H353132 Nonexudative age-related macular degeneration, bilateral, intermediate dry stage: Secondary | ICD-10-CM | POA: Diagnosis not present

## 2015-09-01 DIAGNOSIS — H40013 Open angle with borderline findings, low risk, bilateral: Secondary | ICD-10-CM | POA: Diagnosis not present

## 2015-09-01 DIAGNOSIS — E119 Type 2 diabetes mellitus without complications: Secondary | ICD-10-CM | POA: Diagnosis not present

## 2015-09-01 DIAGNOSIS — Z961 Presence of intraocular lens: Secondary | ICD-10-CM | POA: Diagnosis not present

## 2015-09-01 DIAGNOSIS — H26493 Other secondary cataract, bilateral: Secondary | ICD-10-CM | POA: Diagnosis not present

## 2015-09-01 NOTE — Telephone Encounter (Signed)
I had the RN team call the company. I had received documentation saying her insurance would no longer pay for these. They told the ER and that she had secondary insurance that would pay for them so I filled out this form once again, signed and dated it and it will be faxed.

## 2015-09-01 NOTE — Telephone Encounter (Addendum)
Mrs. [name] [name] is asking that provider contact the company about her pull up diapers.  Their fax # is [phone]

## 2015-09-02 DIAGNOSIS — R634 Abnormal weight loss: Secondary | ICD-10-CM | POA: Diagnosis not present

## 2015-09-02 DIAGNOSIS — R32 Unspecified urinary incontinence: Secondary | ICD-10-CM | POA: Diagnosis not present

## 2015-09-08 DIAGNOSIS — H26492 Other secondary cataract, left eye: Secondary | ICD-10-CM | POA: Diagnosis not present

## 2015-09-11 ENCOUNTER — Other Ambulatory Visit: Payer: Self-pay | Admitting: Student

## 2015-09-13 DIAGNOSIS — E119 Type 2 diabetes mellitus without complications: Secondary | ICD-10-CM | POA: Diagnosis not present

## 2015-09-15 ENCOUNTER — Other Ambulatory Visit: Payer: Self-pay | Admitting: Family Medicine

## 2015-09-15 ENCOUNTER — Encounter: Payer: Self-pay | Admitting: Family Medicine

## 2015-09-15 ENCOUNTER — Ambulatory Visit (INDEPENDENT_AMBULATORY_CARE_PROVIDER_SITE_OTHER): Payer: Medicare Other | Admitting: Family Medicine

## 2015-09-15 VITALS — BP 142/46 | HR 70 | Temp 98.1°F | Ht 60.0 in | Wt 227.8 lb

## 2015-09-15 DIAGNOSIS — D510 Vitamin B12 deficiency anemia due to intrinsic factor deficiency: Secondary | ICD-10-CM

## 2015-09-15 DIAGNOSIS — N183 Chronic kidney disease, stage 3 unspecified: Secondary | ICD-10-CM

## 2015-09-15 DIAGNOSIS — N3946 Mixed incontinence: Secondary | ICD-10-CM

## 2015-09-15 DIAGNOSIS — E1140 Type 2 diabetes mellitus with diabetic neuropathy, unspecified: Secondary | ICD-10-CM | POA: Diagnosis not present

## 2015-09-15 DIAGNOSIS — I10 Essential (primary) hypertension: Secondary | ICD-10-CM | POA: Diagnosis not present

## 2015-09-15 DIAGNOSIS — G2581 Restless legs syndrome: Secondary | ICD-10-CM

## 2015-09-15 LAB — BASIC METABOLIC PANEL WITH GFR
BUN: 23 mg/dL (ref 7–25)
CALCIUM: 9.2 mg/dL (ref 8.6–10.4)
CO2: 29 mmol/L (ref 20–31)
CREATININE: 1.57 mg/dL — AB (ref 0.60–0.93)
Chloride: 96 mmol/L — ABNORMAL LOW (ref 98–110)
GFR, EST NON AFRICAN AMERICAN: 31 mL/min — AB (ref >=60)
GFR, Est African American: 36 mL/min — ABNORMAL LOW (ref >=60)
Glucose, Bld: 152 mg/dL — ABNORMAL HIGH (ref 65–99)
Potassium: 4.8 mmol/L (ref 3.5–5.3)
Sodium: 137 mmol/L (ref 135–146)

## 2015-09-15 LAB — LDL CHOLESTEROL, DIRECT: Direct LDL: 93 mg/dL (ref <130)

## 2015-09-15 LAB — POCT GLYCOSYLATED HEMOGLOBIN (HGB A1C): Hemoglobin A1C: 8.8

## 2015-09-15 LAB — TSH: TSH: 2.84 m[IU]/L

## 2015-09-15 MED ORDER — AMITRIPTYLINE HCL 25 MG PO TABS: ORAL_TABLET | ORAL | Status: AC

## 2015-09-15 NOTE — Progress Notes (Signed)
   Subjective:    Patient ID: [name] [name], female    DOB: 02/03/1934, 82 y.o.   MRN: [id number]  HPI #1. Diabetes mellitus: She has switched to sugar-free ice cream. She's having no episodes of low blood sugar. She does urinate a lot but she's not sure if this is related to her blood sugar or other issues. There is essentially no change in her frequency. #2. Urinary incontinence: She has some questions about urological referral #3. Pernicious anemia: She says she would like to pursue B12 shot today because she hasn't had one for several months and she feels a little more fatigued. #4. Restless legs: She is continuing on the Requip but still has restless leg type symptoms. They're worse at night but they can occur throughout the day.   Review of Systems See history of present illness. Additional pertinent review of systems is negative for fever, sweats, chills, unusual weight change. Denies chest pain, denies shortness of breath, denies dyspnea on exertion, denies PND. She occasionally has some numbness and tingling in her feet.    Objective:   Physical Exam  Vital signs reviewed. GENERAL: Well-developed, well-nourished, no acute distress. CARDIOVASCULAR: Regular rate and rhythm no murmur gallop or rub LUNGS: Clear to auscultation bilaterally, no rales or wheeze. ABDOMEN: Soft positive bowel sounds NEURO: No gross focal neurological deficits. Complete foot exam was done. She has intact sensation to soft touch and sharp touch bilateral feet. MSK: Movement of extremity x 4.        Assessment & Plan:

## 2015-09-15 NOTE — Assessment & Plan Note (Signed)
Continues to have restless leg symptoms even while using the Requip. She is on max dose of that so I will slightly increase her amitriptyline. If she has improvement in her symptoms stay at the 2 tabs daily at bedtime. If after a month she does not notice that there is improvement then I would have her return to the original dose of one tab amitriptyline daily at bedtime.

## 2015-09-15 NOTE — Assessment & Plan Note (Signed)
She said her daughter was questioning whether not she needed to see a specialist. We discussed options and reasons for her urinary incontinence. Part of this is likely related to her hyperglycemia and we discussed that. She's also on Lasix. She's also had 6 children and has history of some pelvic floor collapse. After discussion, she decided not to pursue urological workup at this time. We will continue to try to get her pull ups for her. Her insurance is clearly is giving her #100 per month.

## 2015-09-15 NOTE — Assessment & Plan Note (Signed)
  A1c is 8.8 which is stable. Again I discussed dietary modifications. Ideally I would like to see her level less than 7.0.

## 2015-09-15 NOTE — Assessment & Plan Note (Signed)
B12 shot today. 

## 2015-09-15 NOTE — Patient Instructions (Signed)
I am checking some lab work  I will send you a note. I would like to see you back in about 4 months.

## 2015-09-16 ENCOUNTER — Encounter: Payer: Self-pay | Admitting: Family Medicine

## 2015-09-16 NOTE — Progress Notes (Signed)
Filled out forms for diabetic shoes and inserts fax back to [employer]  [phone] E 11.49 Poor circulation with venous insufficiency, distolic dysfunction LVEF 65-70%. ECHO 2015. Peripheral neuropathy with callous formation Patient has LE numbness and tingling aalthough still retains essentially grossly normal SST and SSS touch. Her callous formation is minimal to moderate as of yesterdays foot exam. Peripheral neuropathy complicated by spinal stenosis.

## 2015-09-21 ENCOUNTER — Encounter: Payer: Self-pay | Admitting: Family Medicine

## 2015-09-21 DIAGNOSIS — J9601 Acute respiratory failure with hypoxia: Secondary | ICD-10-CM | POA: Diagnosis not present

## 2015-09-21 DIAGNOSIS — R32 Unspecified urinary incontinence: Secondary | ICD-10-CM | POA: Diagnosis not present

## 2015-10-05 ENCOUNTER — Telehealth: Payer: Self-pay | Admitting: Family Medicine

## 2015-10-05 NOTE — Telephone Encounter (Signed)
I have filled out this form, saved a copy to  Chart [facility] Certificate medical necessity Denny LevySara Aurelia [name] [phone]

## 2015-10-05 NOTE — Telephone Encounter (Signed)
Patient calling about the form for her incontinent supplies.  Need sent back to company so she can receive her pull ups.

## 2015-10-12 DIAGNOSIS — N3946 Mixed incontinence: Secondary | ICD-10-CM | POA: Diagnosis not present

## 2015-10-14 ENCOUNTER — Telehealth: Payer: Self-pay | Admitting: *Deleted

## 2015-10-14 DIAGNOSIS — E1140 Type 2 diabetes mellitus with diabetic neuropathy, unspecified: Secondary | ICD-10-CM

## 2015-10-14 NOTE — Telephone Encounter (Signed)
Pt states that she cant go to [facility] because she owes $100.  She wants to know if Dr. Jennette KettleNeal can send her somewhere else. Fleeger, Maryjo RochesterJessica Dawn, CMA

## 2015-10-18 NOTE — Telephone Encounter (Signed)
Tia  Is there anywhere else she can go? Denny LevySara Nicklas Mcsweeney

## 2015-10-18 NOTE — Telephone Encounter (Signed)
Yes, pt can be referred to another podiatrist. Please place a new referral and [facility]

## 2015-10-22 DIAGNOSIS — J9601 Acute respiratory failure with hypoxia: Secondary | ICD-10-CM | POA: Diagnosis not present

## 2015-10-22 DIAGNOSIS — R32 Unspecified urinary incontinence: Secondary | ICD-10-CM | POA: Diagnosis not present

## 2015-11-01 DIAGNOSIS — E1140 Type 2 diabetes mellitus with diabetic neuropathy, unspecified: Secondary | ICD-10-CM | POA: Diagnosis not present

## 2015-11-03 ENCOUNTER — Ambulatory Visit: Payer: Medicare Other | Admitting: Podiatry

## 2015-11-12 DIAGNOSIS — N3946 Mixed incontinence: Secondary | ICD-10-CM | POA: Diagnosis not present

## 2015-11-18 ENCOUNTER — Other Ambulatory Visit: Payer: Self-pay | Admitting: Family Medicine

## 2015-11-21 DIAGNOSIS — R32 Unspecified urinary incontinence: Secondary | ICD-10-CM | POA: Diagnosis not present

## 2015-11-21 DIAGNOSIS — J9601 Acute respiratory failure with hypoxia: Secondary | ICD-10-CM | POA: Diagnosis not present

## 2015-11-30 ENCOUNTER — Telehealth: Payer: Self-pay | Admitting: Family Medicine

## 2015-11-30 NOTE — Telephone Encounter (Signed)
ERROR

## 2015-12-07 ENCOUNTER — Ambulatory Visit: Payer: Medicare Other | Admitting: Podiatry

## 2015-12-13 DIAGNOSIS — N3946 Mixed incontinence: Secondary | ICD-10-CM | POA: Diagnosis not present

## 2015-12-14 ENCOUNTER — Telehealth: Payer: Self-pay | Admitting: Family Medicine

## 2015-12-14 ENCOUNTER — Other Ambulatory Visit: Payer: Self-pay | Admitting: Family Medicine

## 2015-12-14 MED ORDER — INSULIN LISPRO 100 UNIT/ML (KWIKPEN): 20.0000 [IU] | PEN_INJECTOR | Freq: Two times a day (BID) | SUBCUTANEOUS | Status: DC

## 2015-12-14 NOTE — Telephone Encounter (Signed)
EMERGENCY TELEPHONE LINE  Returned page to Emergency Telephone Line. States she ran out of insulin this afternoon and requests a refill. Discussed that medications are not normally refilled over the phone. Exceptions are often made for insulin and this will be refilled once with additional refills needing to come from PCP. States she is out of Humalog, but has plenty of Lantus. Refill sent to pharmacy and she states she will go and pick it up tonight to take tonights dose. Discussed that she is due for diabetes follow up this month and encouraged her to call and schedule an appointment with her PCP. No additional needs at this time.  Dr. Caroleen Hammanumley 12/14/15, 10:18 PM

## 2015-12-15 ENCOUNTER — Other Ambulatory Visit: Payer: Self-pay | Admitting: Family Medicine

## 2015-12-15 MED ORDER — INSULIN LISPRO 100 UNIT/ML (KWIKPEN): 20.0000 [IU] | PEN_INJECTOR | Freq: Two times a day (BID) | SUBCUTANEOUS | Status: AC

## 2015-12-21 ENCOUNTER — Other Ambulatory Visit: Payer: Self-pay | Admitting: Family Medicine

## 2015-12-22 DIAGNOSIS — J9601 Acute respiratory failure with hypoxia: Secondary | ICD-10-CM | POA: Diagnosis not present

## 2015-12-22 DIAGNOSIS — R32 Unspecified urinary incontinence: Secondary | ICD-10-CM | POA: Diagnosis not present

## 2015-12-26 ENCOUNTER — Other Ambulatory Visit: Payer: Self-pay | Admitting: Family Medicine

## 2015-12-29 ENCOUNTER — Encounter: Payer: Self-pay | Admitting: Family Medicine

## 2015-12-29 ENCOUNTER — Ambulatory Visit (INDEPENDENT_AMBULATORY_CARE_PROVIDER_SITE_OTHER): Payer: Medicare Other | Admitting: Family Medicine

## 2015-12-29 VITALS — BP 154/43 | HR 78 | Temp 98.4°F | Ht 60.0 in | Wt 235.0 lb

## 2015-12-29 DIAGNOSIS — E1140 Type 2 diabetes mellitus with diabetic neuropathy, unspecified: Secondary | ICD-10-CM | POA: Diagnosis not present

## 2015-12-29 DIAGNOSIS — R269 Unspecified abnormalities of gait and mobility: Secondary | ICD-10-CM | POA: Diagnosis not present

## 2015-12-29 DIAGNOSIS — I272 Other secondary pulmonary hypertension: Secondary | ICD-10-CM

## 2015-12-29 DIAGNOSIS — M4806 Spinal stenosis, lumbar region: Secondary | ICD-10-CM

## 2015-12-29 DIAGNOSIS — D510 Vitamin B12 deficiency anemia due to intrinsic factor deficiency: Secondary | ICD-10-CM | POA: Diagnosis not present

## 2015-12-29 DIAGNOSIS — M48061 Spinal stenosis, lumbar region without neurogenic claudication: Secondary | ICD-10-CM

## 2015-12-29 LAB — POCT GLYCOSYLATED HEMOGLOBIN (HGB A1C): HEMOGLOBIN A1C: 9.2

## 2015-12-29 LAB — BASIC METABOLIC PANEL WITH GFR
BUN: 24 mg/dL (ref 7–25)
CHLORIDE: 97 mmol/L — AB (ref 98–110)
CO2: 32 mmol/L — ABNORMAL HIGH (ref 20–31)
CREATININE: 1.51 mg/dL — AB (ref 0.60–0.88)
Calcium: 8.4 mg/dL — ABNORMAL LOW (ref 8.6–10.4)
GFR, EST NON AFRICAN AMERICAN: 32 mL/min — AB (ref >=60)
GFR, Est African American: 37 mL/min — ABNORMAL LOW (ref >=60)
Glucose, Bld: 313 mg/dL — ABNORMAL HIGH (ref 65–99)
POTASSIUM: 4.8 mmol/L (ref 3.5–5.3)
SODIUM: 139 mmol/L (ref 135–146)

## 2015-12-29 MED ORDER — CYANOCOBALAMIN 1000 MCG/ML IJ SOLN
1000.0000 ug | Freq: Once | INTRAMUSCULAR | Status: AC
  Administered 2015-12-29: 1000 ug via INTRAMUSCULAR

## 2015-12-30 NOTE — Progress Notes (Addendum)
Patient ID: [name] [name], female   DOB: 1934/06/08, 82 y.o.   MRN: [id number]  Complaint: The major reason for this visit was to conduct a mobility examination. Face to face evaluation.  HPI: Mrs. Ticer has been having increasing problems with mobility. She is using a walker secondary to her other underlying conditions which include peripheral neuropathy from her diabetes mellitus, her lumbar spinal stenosis, her obesity, her severe shoulder arthritis and her pulmonary hypertension, use of her walker for even every day type activity around the home is becoming more difficult. She does not get out much, staying at home mostly. She lives in a fairly large house that it's all on one story. She has some steps coming up to the house with a ramp built over the steps (wheelchair ramp). She uses the ramp with her walker to go in and out of her house now. She is unable to use a manual wheelchair secondary to her severe shoulder arthritis. Her overall ability is hampered by her general medical conditions which include specifically:  including her peripheral neuropathy, pulmonary hypertension, cardiovascular disease, and general deconditioning. Without her walker she's unable to stand steady. She's had a couple of falls with the walker mostly if she tries to move too quickly. She would not be able to operate a scooter because of her problems with upper extremities, inhibiting her ability to operate a tiller scooter. The patient requires a power mobility device now because: Inability to walk more than 20 feet with use of walker. She also cannot use a manual wheelchair secondary to her pulmonary disease and severe glenohumeral arthritis.  Height and weight for this patient are located in the vitals portion of the chart My evaluation of this patient is detailed in the following checklist. This is put in check list format for ease of access to information and standardization. This note pertains only to this patient. 1.  This patient meets the following criteria for a power mobility device based on presence of:    Ability to shift weight is: Severely decreased, she has postural instability, arthritis of her glenohumeral joints causing instability with use of a walker or cane.   The MR ADLs  Impaired or impacted in the home by the patient's diagnoses/conditions listed above include :  : She has impaired ability to ambulate using the walker causing problems getting to the bathroom to toilet or bathe, she cannot get to the kitchen to prepare meals. If she's in the kitchen trying to for paramedial, she's unstable trying to hold onto her walker with one hand while she is food. Mobility from room to room in her house is severely limited. The power mobility device is necessary to assist this patient with the following MRADLs bathing, toileting, preparing food, dressing.  3. The ASSISTIVE device needed is a power mobilty device. This patient cannot use a cane, awalker, a manual wheelchair or a scooter because:  She is unable to use a manual wheelchair secondary to her severe shoulder arthritis. Her overall ability is hampered by her general medical conditions which include specifically:  including her peripheral neuropathy, pulmonary hypertension, cardiovascular disease, and general deconditioning.She would not be able to operate a scooter because of her problems with upper extremities, inhibiting her ability to operate a tiller scooter. The patient requires a power mobility device now because: Inability to walk more than 20 feet with use of walker. She also cannot use a manual wheelchair secondary to her pulmonary disease and severe glenohumeral arthritis.  Without the requested power mobility device, the patient will not be able to have mobility in her house to toilet, dress, prepare food in a reasonable amount of time without heightened risk of falling, possibly having hip fracture or other major detrimental event. c.)This  patient is placed at a reasonably determined heightened risk of morbidity or mortality secondary to attempts to perform the MRADLs specified without this power mobility device device:   This patient's mobility needs will not be met by a  cane, a walker, a manual wheelchair or a scooter due to: Severe glenohumeral arthritis, truncal instability, general deconditioning, pulmonary hypertension. Additionally she has some peripheral neuropathy which inhibits her sensation in her feet and her balance.  She can safely operate the power mobility device in their home.  She is patient is willing and motivated to use this requested power mobility device in their home. 8. EXAMINATION: Strength: RUE: 4 out of 5 in forward flexion, abduction. Cannot elevate past 90 in forward plane or lateral plane. LUE:4 out of 5 in forward flexion, abduction. Cannot elevate past 90 in forward plane or lateral plane.  RLE decreased knee extension with strength 4 out of 5, flexion 5 out of 5. LLE decreased knee extension with strength 4 out of 5 and flexion strength 5 out of 5. Gait is unstable even with the use of assistive devices and she has back of general core strength to keep her up right ear with use of walker without some imbalance. PAIN patient has pain in bilateral hips and knees worse with standing.8/10. ROM (degrees of limitation) bilateral shoulders forward flexion and abduction limited to 90, bilateral lower extremities extension at the knee limited to 85 on the left, 90 on the right with intact flexion to 90 bilaterally. GAIT: Ataxic and unsteady

## 2015-12-31 NOTE — Assessment & Plan Note (Signed)
Complete mobility assessment today and discussion about potential use of mobility devices in her home to help her with her activities of daily living. Currently 50% of our 30 minute office visit was spent in counseling and education regarding these issues as well as coordination for potential mobility device.

## 2016-01-04 ENCOUNTER — Encounter: Payer: Self-pay | Admitting: Family Medicine

## 2016-01-05 ENCOUNTER — Ambulatory Visit: Payer: Medicare Other | Admitting: Podiatry

## 2016-01-05 ENCOUNTER — Encounter (INDEPENDENT_AMBULATORY_CARE_PROVIDER_SITE_OTHER): Payer: Medicare Other | Admitting: Podiatry

## 2016-01-05 NOTE — Progress Notes (Signed)
This encounter was created in error - please disregard.

## 2016-01-12 DIAGNOSIS — N3946 Mixed incontinence: Secondary | ICD-10-CM | POA: Diagnosis not present

## 2016-01-18 ENCOUNTER — Other Ambulatory Visit: Payer: Self-pay | Admitting: Family Medicine

## 2016-01-19 ENCOUNTER — Ambulatory Visit: Payer: Self-pay | Admitting: Student

## 2016-01-19 ENCOUNTER — Ambulatory Visit (INDEPENDENT_AMBULATORY_CARE_PROVIDER_SITE_OTHER): Payer: Medicare Other | Admitting: Student

## 2016-01-19 DIAGNOSIS — R002 Palpitations: Secondary | ICD-10-CM | POA: Diagnosis not present

## 2016-01-19 DIAGNOSIS — R251 Tremor, unspecified: Secondary | ICD-10-CM | POA: Diagnosis not present

## 2016-01-19 NOTE — Patient Instructions (Signed)
Follow with PCP, Dr. Lloyd Huger If you feel shaky please check her blood sugar If you feel weaker heart is beating fast, you have shortness of breath, headache, chest pain go to the emergency room right away If you have questions or concerns call the office at [phone]

## 2016-01-19 NOTE — Assessment & Plan Note (Signed)
No evidence of cardiac abnormalities on exam or by patient report today. No red flag symptoms during these episodes ( i.e chest pain, SOB, headache, dizziness, weakness). She denies anxiety related to these episodes but as she has endorsed feeling anxious at other times, there is concern that this may be related. She is on Celexa for depression and reports compliance with this. She is not sure if she would like to increase this medication at this time or add another medication for potential anxiety and would like to discuss this with her PCP - Will follow with PCP for potential anxiety  - will follow for sensation of pounding heart beat. Return and ED precautions discussed - if she has repeat episodes or worsening symptoms, consider referral to cardiolgy

## 2016-01-19 NOTE — Progress Notes (Signed)
   Subjective:    Patient ID: [name] [name], female    DOB: Mar 26, 1934, 82 y.o.   MRN: [id number]   CC: Heart pounding  HPI: 82 year old female presenting for occasional heart pounding sensation   Heart pounding - Over the last 1-2 weeks she is noted 3 episodes of her heart pounding - This lasts for 1-2 seconds - last episode 6 days ago - denies chest pain, shortness of breath, headache, weakness, dizziness associated - She is an sitting in her chair during each of these episodes - She denies feeling nervous during this episodes  Shaky feeling/Anxiety -Reports that she occasionally feels shaky and feels it is due to her nerves, or feeling anxious - unable to day how long she has been having these episodes - denies low mood, SI/HI - She does not check her blood sugar when she feels this way and is not sure if she is hypoglycemic, but denies weakness, dizziness or headache associated - She does check her blood sugar 2-3 times daily and he typically run in the 200s to 300s. The lowest it has been is in the high 100s      Smoking status reviewed  Review of Systems  Per HPI, else denies recent illness, fever, headache, changes in vision, chest pain, shortness of breath, abdominal pain, N/V/D, weakness    Objective:  BP (!) 157/56 (BP Location: Right Arm, Patient Position: Sitting, Cuff Size: Large)   Pulse 75   Temp 98.6 F (37 C) (Oral)   Wt 236 lb (107 kg)   BMI 46.09 kg/m  Vitals and nursing note reviewed  General: NAD, no evidence of tremor Cardiac: RRR Respiratory: CTAB, normal effort Extremities: no edema or cyanosis. WWP. Skin: warm and dry, no rashes noted Neuro: alert and oriented   Assessment & Plan:    Pounding heartbeat No evidence of cardiac abnormalities on exam or by patient report today. No red flag symptoms during these episodes ( i.e chest pain, SOB, headache, dizziness, weakness). She denies anxiety related to these episodes but as she has  endorsed feeling anxious at other times, there is concern that this may be related. She is on Celexa for depression and reports compliance with this. She is not sure if she would like to increase this medication at this time or add another medication for potential anxiety and would like to discuss this with her PCP - Will follow with PCP for potential anxiety  - will follow for sensation of pounding heart beat. Return and ED precautions discussed - if she has repeat episodes or worsening symptoms, consider referral to cardiolgy  Shakiness Ongoing occasional episodes of shakiness. The differential is broad including most likely anxiety, hypoglycemia, less likely tremor - patient with discuss possible anxiety with PCP - she was asked to check her blood glucose when she feels shaky and, if low, eat or drink something sweet     [name] A. Kennon Rounds MD, MS Family Medicine Resident PGY-3 Pager [phone]

## 2016-01-19 NOTE — Assessment & Plan Note (Signed)
Ongoing occasional episodes of shakiness. The differential is broad including most likely anxiety, hypoglycemia, less likely tremor - patient with discuss possible anxiety with PCP - she was asked to check her blood glucose when she feels shaky and, if low, eat or drink something sweet

## 2016-01-21 DIAGNOSIS — R32 Unspecified urinary incontinence: Secondary | ICD-10-CM | POA: Diagnosis not present

## 2016-01-21 DIAGNOSIS — J9601 Acute respiratory failure with hypoxia: Secondary | ICD-10-CM | POA: Diagnosis not present

## 2016-01-31 ENCOUNTER — Emergency Department (HOSPITAL_COMMUNITY)
Admission: EM | Admit: 2016-01-31 | Discharge: 2016-02-25 | Disposition: E | Payer: Medicare Other | Attending: Emergency Medicine | Admitting: Emergency Medicine

## 2016-01-31 ENCOUNTER — Encounter (HOSPITAL_COMMUNITY): Payer: Self-pay | Admitting: *Deleted

## 2016-01-31 DIAGNOSIS — Z7982 Long term (current) use of aspirin: Secondary | ICD-10-CM | POA: Insufficient documentation

## 2016-01-31 DIAGNOSIS — Z7722 Contact with and (suspected) exposure to environmental tobacco smoke (acute) (chronic): Secondary | ICD-10-CM | POA: Insufficient documentation

## 2016-01-31 DIAGNOSIS — J449 Chronic obstructive pulmonary disease, unspecified: Secondary | ICD-10-CM | POA: Insufficient documentation

## 2016-01-31 DIAGNOSIS — Z794 Long term (current) use of insulin: Secondary | ICD-10-CM | POA: Insufficient documentation

## 2016-01-31 DIAGNOSIS — Y939 Activity, unspecified: Secondary | ICD-10-CM | POA: Insufficient documentation

## 2016-01-31 DIAGNOSIS — N183 Chronic kidney disease, stage 3 (moderate): Secondary | ICD-10-CM | POA: Insufficient documentation

## 2016-01-31 DIAGNOSIS — Y999 Unspecified external cause status: Secondary | ICD-10-CM | POA: Insufficient documentation

## 2016-01-31 DIAGNOSIS — E1140 Type 2 diabetes mellitus with diabetic neuropathy, unspecified: Secondary | ICD-10-CM | POA: Insufficient documentation

## 2016-01-31 DIAGNOSIS — Y929 Unspecified place or not applicable: Secondary | ICD-10-CM | POA: Diagnosis not present

## 2016-01-31 DIAGNOSIS — S301XXA Contusion of abdominal wall, initial encounter: Secondary | ICD-10-CM | POA: Diagnosis not present

## 2016-01-31 DIAGNOSIS — X58XXXA Exposure to other specified factors, initial encounter: Secondary | ICD-10-CM | POA: Diagnosis not present

## 2016-01-31 DIAGNOSIS — I129 Hypertensive chronic kidney disease with stage 1 through stage 4 chronic kidney disease, or unspecified chronic kidney disease: Secondary | ICD-10-CM | POA: Insufficient documentation

## 2016-01-31 DIAGNOSIS — I469 Cardiac arrest, cause unspecified: Secondary | ICD-10-CM | POA: Diagnosis not present

## 2016-01-31 DIAGNOSIS — Z96652 Presence of left artificial knee joint: Secondary | ICD-10-CM | POA: Insufficient documentation

## 2016-01-31 LAB — I-STAT CHEM 8, ED
BUN: 44 mg/dL — ABNORMAL HIGH (ref 6–20)
CALCIUM ION: 0.95 mmol/L — AB (ref 1.12–1.23)
CHLORIDE: 102 mmol/L (ref 101–111)
Creatinine, Ser: 1.9 mg/dL — ABNORMAL HIGH (ref 0.44–1.00)
GLUCOSE: 241 mg/dL — AB (ref 65–99)
HCT: 39 % (ref 36.0–46.0)
HEMOGLOBIN: 13.3 g/dL (ref 12.0–15.0)
Potassium: 4.8 mmol/L (ref 3.5–5.1)
Sodium: 139 mmol/L (ref 135–145)
TCO2: 26 mmol/L (ref 0–100)

## 2016-01-31 MED ORDER — DEXTROSE 5 % IV SOLN
INTRAVENOUS | Status: AC | PRN
  Administered 2016-01-31: 150 mg via INTRAVENOUS

## 2016-01-31 MED ORDER — EPINEPHRINE HCL 0.1 MG/ML IJ SOSY
PREFILLED_SYRINGE | INTRAMUSCULAR | Status: AC | PRN
  Administered 2016-01-31 (×3): 1 mg via INTRAVENOUS

## 2016-01-31 MED FILL — Medication: Qty: 1 | Status: AC

## 2016-02-01 ENCOUNTER — Ambulatory Visit: Payer: Medicare Other | Admitting: Podiatry

## 2016-02-02 ENCOUNTER — Ambulatory Visit: Payer: Self-pay | Admitting: Family Medicine

## 2016-02-21 DIAGNOSIS — R32 Unspecified urinary incontinence: Secondary | ICD-10-CM | POA: Diagnosis not present

## 2016-02-21 DIAGNOSIS — J9601 Acute respiratory failure with hypoxia: Secondary | ICD-10-CM | POA: Diagnosis not present

## 2016-02-25 NOTE — ED Provider Notes (Signed)
[facility]-EMERGENCY DEPT Provider Note   CSN: [id number] Arrival date & time: 01/26/2016  1157  First Provider Contact:  First MD Initiated Contact with Patient January 05, 2016 1150        History   Chief Complaint Chief Complaint  Patient presents with  . Cardiac Arrest    HPI [name] [name] is a 82 y.o. female.  HPI  Patient presents in extremis via EMS. Per report the patient had unwitnessed loss of consciousness Family members told providers that the patient was last seen approximately 10 minutes prior to being discovered unresponsive. Anus reports that the initial rhythm was PTA. Patient received ACLS resuscitation en route, with one brief period of ROSC patient received multiple doses of epinephrine, amiodarone 1 On arrival the patient was actively receiving CPR, with no ability to provide details of the history of present illness. Level V caveat.  .   Past Medical History:  Diagnosis Date  . Asthma   . At risk for falls 05/05/2014  . CHF (congestive heart failure) (HCC)   . Diabetes mellitus without complication (HCC)   . Diabetic frozen shoulder associated with type 2 diabetes mellitus (HCC) 05/05/2014  . History of cystoscopy 08/2005   normal  . Hypertension   . Mild cognitive impairment 08/27/2012   MMSE 26/30 Feb 2014. [employer] declined to cover Donepezil   . Neuropathy (HCC)   . Pulmonary hypertension, mild (HCC) 04/21/2014   Echocardiogram 02/20/2014: estimated Pulmonary Artery Pressure 33 mmHg.   . Sleep apnea   . Spinal stenosis in cervical region 02/22/2014    Patient Active Problem List   Diagnosis Date Noted  . Pounding heartbeat 01/19/2016  . Shakiness 01/19/2016  . COPD mixed type (HCC) 03/03/2015  . Insomnia 07/15/2014  . Vision decreased, Right eye 05/05/2014  . Diabetic frozen right shoulder associated with type 2 diabetes mellitus 05/05/2014  . At risk for falls 05/05/2014  . Pulmonary hypertension, mild (HCC) 04/21/2014  . Spinal stenosis in cervical  region 02/22/2014  . History of total left knee replacement 04/28/2013  . Trigger finger, acquired 04/28/2013  . Mild cognitive impairment 08/27/2012  . Morbid obesity with BMI of 40.0-44.9, adult (HCC) 04/30/2012  . Vitamin D deficiency 04/23/2012  . Kidney disease, chronic, stage III (GFR 30-59 ml/min) 07/14/2011  . 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 OSTEOPOROSIS 03/31/2010  . SPINAL STENOSIS, LUMBAR 03/10/2010  . GAIT DISTURBANCE 01/04/2010  . RESTLESS LEGS SYNDROME 07/16/2008  . DM (diabetes mellitus) type II controlled, neurological manifestation (HCC) 02/05/2008  . Urinary incontinence 11/14/2007  . ANEMIA, PERNICIOUS 03/11/2007  . HYPERLIPIDEMIA 08/23/2006  . DEPRESSIVE DISORDER, NOS 08/23/2006  . HYPERTENSION, BENIGN SYSTEMIC 08/23/2006  . VENOUS INSUFFICIENCY, CHRONIC 08/23/2006  . GASTROESOPHAGEAL REFLUX, NO ESOPHAGITIS 08/23/2006  . Osteoarthrosis, unspecified whether generalized or localized, involving lower leg 08/23/2006  . APNEA, SLEEP 08/23/2006    Past Surgical History:  Procedure Laterality Date  . CATARACT EXTRACTION W/ INTRAOCULAR LENS IMPLANT  2012   bilateral  . TOTAL KNEE ARTHROPLASTY  02/2010   left    OB History    No data available       Home Medications    Prior to Admission medications   Medication Sig Start Date End Date Taking? Authorizing Provider  ACCU-CHEK COMPACT PLUS test strip USE AS DIRECTED THREE TIMES DAILY. 01/18/16   Nestor RampSara L Neal, MD  albuterol (PROVENTIL HFA;VENTOLIN HFA) 108 (90 BASE) MCG/ACT inhaler Inhale 2 puffs into the lungs every 4 (four) hours as needed for  wheezing or shortness of breath. 03/10/15   Nestor Ramp, MD  allopurinol (ZYLOPRIM) 300 MG tablet TAKE ONE TABLET BY MOUTH EVERY DAY 06/08/15   Nestor Ramp, MD  allopurinol (ZYLOPRIM) 300 MG tablet TAKE ONE TABLET BY MOUTH EVERY DAY 06/07/15   Nestor Ramp, MD  allopurinol (ZYLOPRIM) 300 MG tablet TAKE ONE TABLET BY MOUTH EVERY DAY 12/21/15   Nestor Ramp, MD  amitriptyline (ELAVIL) 25 MG tablet Take 1-2 at bedtime for sleep and restless legs 09/15/15   Nestor Ramp, MD  aspirin EC 81 MG tablet Take 81 mg by mouth daily.    Historical Provider, MD  beclomethasone (QVAR) 40 MCG/ACT inhaler Inhale 1 puff into the lungs 2 (two) times daily. 11/25/14   Nestor Ramp, MD  carvedilol (COREG) 3.125 MG tablet TAKE 1 TABLET BY MOUTH TWICE DAILY WITH A MEAL 07/21/15   Nestor Ramp, MD  citalopram (CELEXA) 40 MG tablet Take 1 tablet (40 mg total) by mouth daily. 05/13/14   Nestor Ramp, MD  cyanocobalamin (,VITAMIN B-12,) 1000 MCG/ML injection Inject into muscle once every 3 months 02/25/14   Nestor Ramp, MD  fluticasone [facility]) 50 MCG/ACT nasal spray Place 2 sprays into both nostrils daily as needed for allergies or rhinitis.     Historical Provider, MD  furosemide (LASIX) 40 MG tablet TAKE 2 TABLETS BY MOUTH EVERY MORNING AND 1 TABLET EVERY DAY AT 2PM 01/20/15   Nestor Ramp, MD  Insulin Glargine (LANTUS SOLOSTAR) 100 UNIT/ML Solostar Pen Inject 55 Units into the skin 2 (two) times daily.     Historical Provider, MD  insulin lispro (HUMALOG KWIKPEN) 100 UNIT/ML KiwkPen Inject 0.2 mLs (20 Units total) into the skin 2 (two) times daily. Before a meal. 12/15/15   Nestor Ramp, MD  Insulin Pen Needle 31G X 5 MM MISC Use to inject insulin twice daily 12/15/14   Nestor Ramp, MD  LANTUS SOLOSTAR 100 UNIT/ML Solostar Pen INJECT 50 UNITS INTO THE SKIN TWICE DAILY Patient not taking: Reported on 01/19/2016 09/14/15   Nestor Ramp, MD  oxybutynin (DITROPAN) 5 MG tablet TAKE 1/2 TABLET BY MOUTH TWICE DAILY 12/27/15   Nestor Ramp, MD  QVAR 40 MCG/ACT inhaler INHALE 1 PUFF BY MOUTH TWICE DAILY 08/23/15   Nestor Ramp, MD  rOPINIRole (REQUIP) 4 MG tablet TAKE 1 TABLET BY MOUTH AT BEDTIME 10/01/14   Nestor Ramp, MD  rOPINIRole (REQUIP) 4 MG tablet TAKE 1 TABLET BY MOUTH EVERY NIGHT AT BEDTIME 08/24/15   Nestor Ramp, MD  simvastatin (ZOCOR) 20 MG tablet TAKE 1 TABLET BY MOUTH EVERY NIGHT AT  BEDTIME 03/06/15   Nestor Ramp, MD    Family History Family History  Problem Relation Age of Onset  . Diabetes Mother   . Alcohol abuse Son   . Alcohol abuse Daughter     died  . Diabetes Daughter   . Diabetes Sister   . Coronary artery disease Sister     MI  . Alzheimer's disease Sister     died of heart proble  . Diabetes Daughter   . Diabetes Daughter   . Diabetes Son   . Diabetes Son     Social History Social History  Substance Use Topics  . Smoking status: Passive Smoke Exposure - Never Smoker  . Smokeless tobacco: Not on file  . Alcohol use No     Allergies   Cephalexin; Clindamycin/lincomycin; Enalapril maleate; Tramadol;  Codeine; and Mirtazapine   Review of Systems Review of Systems  Unable to perform ROS: Acuity of condition     Physical Exam Updated Vital Signs BP (!) 0/0 (BP Location: Right Arm)   Pulse (!) 0   Resp (!) 0   SpO2 (!) 0%   Physical Exam  Constitutional:  Obese elderly female, unresponsive, actively receiving CPR  HENT:  Head: Atraumatic.  Eyes:  No blink response  Cardiovascular:  No sustained spontaneous pulses, palpable fem pulses w cardiac compressions  Pulmonary/Chest:  Ventilates easily, oxygenates to 100% w assistance w Brooke Dare airway  Abdominal: She exhibits distension.  Neurological:  Flaccid, unresponsive  Skin:  Ecchymosis about the anterior lower abdomen  Psychiatric:  unresponsivea     ED Treatments / Results  Labs (all labs ordered are listed, but only abnormal results are displayed) Labs Reviewed  I-STAT CHEM 8, ED - Abnormal; Notable for the following:       Result Value   BUN 44 (*)    Creatinine, Ser 1.90 (*)    Glucose, Bld 241 (*)    Calcium, Ion 0.95 (*)    All other components within normal limits    Cardiac monitor shows asystole on initial rhythm, after transfer to our recommendation continued to receive CPR, via protocol.  After several rounds of additional CPR, epinephrine, additional  amiodarone, patient had no return of spontaneous circulation.  Patient had bedside cardiac ultrasound was no demonstration of ongoing cardiac activity.     EMERGENCY DEPARTMENT [place] CARDIAC EXAM "Study: Limited Ultrasound of the heart and pericardium"  INDICATIONS:Cardiac arrest Multiple views of the heart and pericardium are obtained with a multi-frequency probe.  PERFORMED VO:[id number]  IMAGES ARCHIVED?: No  FINDINGS: No cardiac activity  LIMITATIONS:  Body habitus and Emergent procedure  VIEWS USED: Parasternal long axis and Parasternal short axis  INTERPRETATION: Cardiac activity absent and Pericardial effusioin absent  COMMENT:  No cardiac activity  Cardiopulmonary Resuscitation (CPR) Procedure Note Directed/Performed by: Gerhard Munch I personally directed ancillary staff and/or performed CPR in an effort to regain return of spontaneous circulation and to maintain cardiac, neuro and systemic perfusion.    Procedures Procedures (including critical care time)  Medications Ordered in ED Medications  EPINEPHrine (ADRENALIN) 0.1 MG/ML injection (1 mg Intravenous Given 02-25-16 1206)  amiodarone (CORDARONE) 150 mg in dextrose 5 % 100 mL bolus (150 mg Intravenous New Bag/Given 02/25/2016 1204)     Initial Impression / Assessment and Plan / ED Course   Cardiac arrest  Final Clinical Impressions(s) / ED Diagnoses  Elderly female with multiple medical issues including hypertension, diabetes, congestive heart failure presents in extremis. Patient received appropriate resuscitation here, with no return of spontaneous circulation per Patient cleared deceased at 1210.  Patient's family is aware.  Patient's primary care team is also aware to sign death certificate (Dr. [name])   Gerhard Munch, MD 2016-02-25 1231

## 2016-02-25 NOTE — ED Notes (Signed)
Family & chaplain remains with pt

## 2016-02-25 NOTE — Code Documentation (Signed)
Bedside US completed, no mechanical activity of the heart

## 2016-02-25 NOTE — Code Documentation (Signed)
Patient time of death occurred at 1210. 

## 2016-02-25 NOTE — ED Triage Notes (Signed)
[facility] per report the pt was found unresponsive by family at home with estimated down time 10 mins, upon EMS arrival pt was in asystole, CPR was initiatied & pt rcvd x 2 Epi, pt then in V fib & rcvd shock at 200 amps, pt then had ROSC, pt went asystole again, rcvd x2 epi, 300mg  Amiodarone, 5o0 mg Bicarb, & x1 Epi upon EMS arrival to ED, pt in Asystole upon arrival to ED, chest compressions ongoing, ACLS continued in ED, [facility] & IO in place upon arrival toED

## 2016-02-25 NOTE — ED Notes (Signed)
Chaplain with family, family at bedside

## 2016-02-25 NOTE — Progress Notes (Signed)
   January 15, 2016 1500  Clinical Encounter Type  Visited With Family  Visit Type Death  Referral From Nurse  Consult/Referral To Chaplain  Spiritual Encounters  Spiritual Needs Emotional  Stress Factors  Patient Stress Factors Loss  Family Stress Factors Loss  [facility] provided grief support and hospitality to extended family of patient. Rodney BoozeGail L Yeilin Zweber  January 15, 2016

## 2016-02-25 DEATH — deceased
